# Patient Record
Sex: Female | Born: 1955 | Race: White | Hispanic: No | Smoking: Former smoker
Health system: Southern US, Community
[De-identification: ages and names within clinical notes are randomized; demographics above are authoritative.]

## PROBLEM LIST (undated history)

## (undated) DIAGNOSIS — Z923 Personal history of irradiation: Secondary | ICD-10-CM

## (undated) DIAGNOSIS — F411 Generalized anxiety disorder: Secondary | ICD-10-CM

## (undated) DIAGNOSIS — I10 Essential (primary) hypertension: Secondary | ICD-10-CM

## (undated) DIAGNOSIS — F17200 Nicotine dependence, unspecified, uncomplicated: Secondary | ICD-10-CM

## (undated) DIAGNOSIS — K5730 Diverticulosis of large intestine without perforation or abscess without bleeding: Secondary | ICD-10-CM

## (undated) DIAGNOSIS — G5600 Carpal tunnel syndrome, unspecified upper limb: Secondary | ICD-10-CM

## (undated) DIAGNOSIS — J45909 Unspecified asthma, uncomplicated: Secondary | ICD-10-CM

## (undated) DIAGNOSIS — F1190 Opioid use, unspecified, uncomplicated: Secondary | ICD-10-CM

## (undated) DIAGNOSIS — M5136 Other intervertebral disc degeneration, lumbar region: Secondary | ICD-10-CM

## (undated) DIAGNOSIS — Z8619 Personal history of other infectious and parasitic diseases: Secondary | ICD-10-CM

## (undated) DIAGNOSIS — R739 Hyperglycemia, unspecified: Secondary | ICD-10-CM

## (undated) DIAGNOSIS — R519 Headache, unspecified: Secondary | ICD-10-CM

## (undated) DIAGNOSIS — I2089 Other forms of angina pectoris: Secondary | ICD-10-CM

## (undated) DIAGNOSIS — E7800 Pure hypercholesterolemia, unspecified: Secondary | ICD-10-CM

## (undated) DIAGNOSIS — R7303 Prediabetes: Secondary | ICD-10-CM

## (undated) DIAGNOSIS — H269 Unspecified cataract: Secondary | ICD-10-CM

## (undated) DIAGNOSIS — J449 Chronic obstructive pulmonary disease, unspecified: Secondary | ICD-10-CM

## (undated) DIAGNOSIS — E785 Hyperlipidemia, unspecified: Secondary | ICD-10-CM

## (undated) DIAGNOSIS — R0602 Shortness of breath: Secondary | ICD-10-CM

## (undated) DIAGNOSIS — I951 Orthostatic hypotension: Secondary | ICD-10-CM

## (undated) DIAGNOSIS — R51 Headache: Secondary | ICD-10-CM

## (undated) DIAGNOSIS — K219 Gastro-esophageal reflux disease without esophagitis: Secondary | ICD-10-CM

## (undated) DIAGNOSIS — K635 Polyp of colon: Secondary | ICD-10-CM

## (undated) DIAGNOSIS — F909 Attention-deficit hyperactivity disorder, unspecified type: Secondary | ICD-10-CM

## (undated) DIAGNOSIS — M51369 Other intervertebral disc degeneration, lumbar region without mention of lumbar back pain or lower extremity pain: Secondary | ICD-10-CM

## (undated) HISTORY — PX: BREAST BIOPSY: SHX20

## (undated) HISTORY — DX: Headache: R51

## (undated) HISTORY — DX: Other intervertebral disc degeneration, lumbar region: M51.36

## (undated) HISTORY — PX: ABDOMINAL HYSTERECTOMY: SHX81

## (undated) HISTORY — DX: Personal history of other infectious and parasitic diseases: Z86.19

## (undated) HISTORY — DX: Polyp of colon: K63.5

## (undated) HISTORY — DX: Hyperglycemia, unspecified: R73.9

## (undated) HISTORY — PX: FINGER AMPUTATION: SHX636

## (undated) HISTORY — DX: Headache, unspecified: R51.9

## (undated) HISTORY — PX: COLONOSCOPY: SHX174

## (undated) HISTORY — DX: Other intervertebral disc degeneration, lumbar region without mention of lumbar back pain or lower extremity pain: M51.369

---
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
## 1983-01-02 HISTORY — PX: TOTAL ABDOMINAL HYSTERECTOMY W/ BILATERAL SALPINGOOPHORECTOMY: SHX83

## 2002-01-01 HISTORY — PX: NASAL SINUS SURGERY: SHX719

## 2004-01-02 HISTORY — PX: LASIK: SHX215

## 2004-01-04 ENCOUNTER — Emergency Department: Payer: Self-pay | Admitting: Emergency Medicine

## 2005-02-28 ENCOUNTER — Ambulatory Visit: Payer: Self-pay | Admitting: Pain Medicine

## 2005-06-09 ENCOUNTER — Emergency Department: Payer: Self-pay | Admitting: Unknown Physician Specialty

## 2005-06-10 ENCOUNTER — Emergency Department: Payer: Self-pay | Admitting: General Practice

## 2005-06-10 ENCOUNTER — Other Ambulatory Visit: Payer: Self-pay

## 2006-09-24 ENCOUNTER — Ambulatory Visit: Payer: Self-pay | Admitting: Internal Medicine

## 2007-01-30 ENCOUNTER — Ambulatory Visit: Payer: Self-pay | Admitting: Internal Medicine

## 2007-01-31 ENCOUNTER — Ambulatory Visit: Payer: Self-pay | Admitting: Family Medicine

## 2007-02-04 ENCOUNTER — Ambulatory Visit: Payer: Self-pay | Admitting: Internal Medicine

## 2008-01-02 DIAGNOSIS — K635 Polyp of colon: Secondary | ICD-10-CM

## 2008-01-02 DIAGNOSIS — D126 Benign neoplasm of colon, unspecified: Secondary | ICD-10-CM

## 2008-01-02 HISTORY — DX: Polyp of colon: K63.5

## 2008-01-02 HISTORY — DX: Benign neoplasm of colon, unspecified: D12.6

## 2009-10-05 ENCOUNTER — Emergency Department: Payer: Self-pay | Admitting: Internal Medicine

## 2011-01-02 DIAGNOSIS — Z8619 Personal history of other infectious and parasitic diseases: Secondary | ICD-10-CM

## 2011-01-02 HISTORY — DX: Personal history of other infectious and parasitic diseases: Z86.19

## 2012-01-02 HISTORY — PX: CARPAL TUNNEL RELEASE: SHX101

## 2012-05-08 ENCOUNTER — Ambulatory Visit: Payer: Self-pay | Admitting: Specialist

## 2012-05-15 ENCOUNTER — Ambulatory Visit: Payer: Self-pay | Admitting: Specialist

## 2012-12-01 ENCOUNTER — Telehealth: Payer: Self-pay | Admitting: Family Medicine

## 2012-12-01 NOTE — Telephone Encounter (Signed)
May place this Friday at 3 pm.

## 2012-12-01 NOTE — Telephone Encounter (Signed)
Pt is trying to est w/you as her PCP.  I have scheduled an appmt for 12/19/2012 for her, but she says her BG is running high on a daily basis and she feels she needs to est sooner.  Can you accommodate her an new pt apptmt prior to 12/19/2012?  Thank you.

## 2012-12-01 NOTE — Telephone Encounter (Signed)
Left mssg on pt's home voice mail to return call

## 2012-12-02 NOTE — Telephone Encounter (Signed)
R/s to 12/05/2012 @ 3:00 p.m.

## 2012-12-05 ENCOUNTER — Encounter: Payer: Self-pay | Admitting: Family Medicine

## 2012-12-05 ENCOUNTER — Ambulatory Visit (INDEPENDENT_AMBULATORY_CARE_PROVIDER_SITE_OTHER): Payer: BC Managed Care – PPO | Admitting: Family Medicine

## 2012-12-05 VITALS — BP 118/82 | HR 82 | Temp 97.6°F | Ht 63.0 in | Wt 116.8 lb

## 2012-12-05 DIAGNOSIS — F17200 Nicotine dependence, unspecified, uncomplicated: Secondary | ICD-10-CM

## 2012-12-05 DIAGNOSIS — R739 Hyperglycemia, unspecified: Secondary | ICD-10-CM

## 2012-12-05 DIAGNOSIS — F411 Generalized anxiety disorder: Secondary | ICD-10-CM

## 2012-12-05 DIAGNOSIS — R7309 Other abnormal glucose: Secondary | ICD-10-CM

## 2012-12-05 DIAGNOSIS — R519 Headache, unspecified: Secondary | ICD-10-CM | POA: Insufficient documentation

## 2012-12-05 DIAGNOSIS — Z23 Encounter for immunization: Secondary | ICD-10-CM

## 2012-12-05 DIAGNOSIS — R7303 Prediabetes: Secondary | ICD-10-CM

## 2012-12-05 DIAGNOSIS — R51 Headache: Secondary | ICD-10-CM | POA: Insufficient documentation

## 2012-12-05 HISTORY — DX: Prediabetes: R73.03

## 2012-12-05 HISTORY — DX: Generalized anxiety disorder: F41.1

## 2012-12-05 HISTORY — DX: Nicotine dependence, unspecified, uncomplicated: F17.200

## 2012-12-05 LAB — BASIC METABOLIC PANEL
CO2: 28 mEq/L (ref 19–32)
Chloride: 103 mEq/L (ref 96–112)
Creat: 0.66 mg/dL (ref 0.50–1.10)
Sodium: 139 mEq/L (ref 135–145)

## 2012-12-05 LAB — HEMOGLOBIN A1C
Hgb A1c MFr Bld: 5.9 % — ABNORMAL HIGH (ref <5.7)
Mean Plasma Glucose: 123 mg/dL — ABNORMAL HIGH (ref <117)

## 2012-12-05 MED ORDER — CYCLOBENZAPRINE HCL 10 MG PO TABS: 5.0000 mg | ORAL_TABLET | Freq: Two times a day (BID) | ORAL | Status: DC | PRN

## 2012-12-05 MED ORDER — METFORMIN HCL 500 MG PO TABS: 500.0000 mg | ORAL_TABLET | Freq: Every day | ORAL | Status: DC

## 2012-12-05 NOTE — Assessment & Plan Note (Signed)
Random sugar at home to 395 - concern for dx diabetes. Start metformin, check A1c today. Will reassess at f/u visit.

## 2012-12-05 NOTE — Assessment & Plan Note (Signed)
Discussed anxiety state - does not sound consistent with panic attacks, just increased anxiety at night time. Discussed relaxation techniques and handout provided.

## 2012-12-05 NOTE — Assessment & Plan Note (Signed)
Not consistent with migraines. ?MOH vs TTH. Treat with flexeril abortively, and re assess next visit. rec avoiding OTC analgesics.

## 2012-12-05 NOTE — Addendum Note (Signed)
Addended by: Josph Macho A on: 12/05/2012 04:15 PM   Modules accepted: Orders

## 2012-12-05 NOTE — Patient Instructions (Addendum)
Flu shot today. Blood work today - to check for diabetes. I'd like you to start metformin 500mg  once daily. Keep working on smoking cessation. Good to see you today, call us with questions. Return at your convenience fasting for blood work and afterwards for physical. For headaches - try flexeril muscle relaxant as needed instead of over the counter medicines.  Stress Stress-related medical problems are becoming increasingly common. The body has a built-in physical response to stressful situations. Faced with pressure, challenge or danger, we need to react quickly. Our bodies release hormones such as cortisol and adrenaline to help do this. These hormones are part of the "fight or flight" response and affect the metabolic rate, heart rate and blood pressure, resulting in a heightened, stressed state that prepares the body for optimum performance in dealing with a stressful situation. It is likely that early man required these mechanisms to stay alive, but usually modern stresses do not call for this, and the same hormones released in today's world can damage health and reduce coping ability. CAUSES  Pressure to perform at work, at school or in sports.  Threats of physical violence.  Money worries.  Arguments.  Family conflicts.  Divorce or separation from significant other.  Bereavement.  New job or unemployment.  Changes in location.  Alcohol or drug abuse. SOMETIMES, THERE IS NO PARTICULAR REASON FOR DEVELOPING STRESS. Almost all people are at risk of being stressed at some time in their lives. It is important to know that some stress is temporary and some is long term.  Temporary stress will go away when a situation is resolved. Most people can cope with short periods of stress, and it can often be relieved by relaxing, taking a walk, chatting through issues with friends, or having a good night's sleep.  Chronic (long-term, continuous) stress is much harder to deal with. It  can be psychologically and emotionally damaging. It can be harmful both for an individual and for friends and family. SYMPTOMS Everyone reacts to stress differently. There are some common effects that help [place] recognize it. In times of extreme stress, people may:  Shake uncontrollably.  Breathe faster and deeper than normal (hyperventilate).  Vomit.  For people with asthma, stress can trigger an attack.  For some people, stress may trigger migraine headaches, ulcers, and body pain. PHYSICAL EFFECTS OF STRESS MAY INCLUDE:  Loss of energy.  Skin problems.  Aches and pains resulting from tense muscles, including neck ache, backache and tension headaches.  Increased pain from arthritis and other conditions.  Irregular heart beat (palpitations).  Periods of irritability or anger.  Apathy or depression.  Anxiety (feeling uptight or worrying).  Unusual behavior.  Loss of appetite.  Comfort eating.  Lack of concentration.  Loss of, or decreased, sex-drive.  Increased smoking, drinking, or recreational drug use.  For women, missed periods.  Ulcers, joint pain, and muscle pain. Post-traumatic stress is the stress caused by any serious accident, strong emotional damage, or extremely difficult or violent experience such as rape or war. Post-traumatic stress victims can experience mixtures of emotions such as fear, shame, depression, guilt or anger. It may include recurrent memories or images that may be haunting. These feelings can last for weeks, months or even years after the traumatic event that triggered them. Specialized treatment, possibly with medicines and psychological therapies, is available. If stress is causing physical symptoms, severe distress or making it difficult for you to function as normal, it is worth seeing your caregiver. It is  important to remember that although stress is a usual part of life, extreme or prolonged stress can lead to other illnesses that will  need treatment. It is better to visit a doctor sooner rather than later. Stress has been linked to the development of high blood pressure and heart disease, as well as insomnia and depression. There is no diagnostic test for stress since everyone reacts to it differently. But a caregiver will be able to spot the physical symptoms, such as:  Headaches.  Shingles.  Ulcers. Emotional distress such as intense worry, low mood or irritability should be detected when the doctor asks pertinent questions to identify any underlying problems that might be the cause. In case there are physical reasons for the symptoms, the doctor may also want to do some tests to exclude certain conditions. If you feel that you are suffering from stress, try to identify the aspects of your life that are causing it. Sometimes you may not be able to change or avoid them, but even a small change can have a positive ripple effect. A simple lifestyle change can make all the difference. STRATEGIES THAT CAN HELP DEAL WITH STRESS:  Delegating or sharing responsibilities.  Avoiding confrontations.  Learning to be more assertive.  Regular exercise.  Avoid using alcohol or street drugs to cope.  Eating a healthy, balanced diet, rich in fruit and vegetables and proteins.  Finding humor or absurdity in stressful situations.  Never taking on more than you know you can handle comfortably.  Organizing your time better to get as much done as possible.  Talking to friends or family and sharing your thoughts and fears.  Listening to music or relaxation tapes.  Tensing and then relaxing your muscles, starting at the toes and working up to the head and neck. If you think that you would benefit from help, either in identifying the things that are causing your stress or in learning techniques to help you relax, see a caregiver who is capable of helping you with this. Rather than relying on medications, it is usually better to try  and identify the things in your life that are causing stress and try to deal with them. There are many techniques of managing stress including counseling, psychotherapy, aromatherapy, yoga, and exercise. Your caregiver can help you determine what is best for you. Document Released: 03/10/2002 Document Revised: 03/12/2011 Document Reviewed: 02/04/2007 [facility] Patient Information 2014 [place], [place].

## 2012-12-05 NOTE — Assessment & Plan Note (Signed)
Continue to encourage cessation Pt thinks daughter wil given her E cig for christmas.

## 2012-12-05 NOTE — Progress Notes (Signed)
Pre-visit discussion using our clinic review tool. No additional management support is needed unless otherwise documented below in the visit note.  

## 2012-12-05 NOTE — Progress Notes (Signed)
Subjective:    Patient ID: Ruth Morgan, female    DOB: [birth year], 57 y.o.   MRN: [id number]  HPI CC: new pt to establish  "Trouble with sugar" - states sugars have been running very high - 150-200.  2 wks ago checked sugar = 395, treated with increased water intake.  Does have fmhx diabetes.  + paresthesias of fingers.  Denies chest pain/tightness, dyspnea.  Last eye exam was 3+ yrs ago.  Panic attacks - gets nervous for no reason. Tends to happen at night when in bed. Denies stress.  Smoker - 1/2 - 1 ppd.  Has tried quitting smoking in the past.  Has tried chantix and wellbutrin.  Has not tried NRT or E cig.  Headaches started several weeks ago occipital headaches - associated with nausea.  Taking sinus medicine, bc powders.  Some neck pain as well.  Preventative: No recent CPE Mammogram ~2011 Flu shot today.  Medications and allergies reviewed and updated in chart.  Past histories reviewed and updated if relevant as below. There are no active problems to display for this patient.  Past Medical History  Diagnosis Date  . History of shingles 2013  . Headache   . Colon polyp 2010    at [facility]  . DDD (degenerative disc disease), lumbar     by MRI  . Hyperglycemia    Past Surgical History  Procedure Laterality Date  . Nasal sinus surgery  2004  . Carpal tunnel release Right 2014  . Lasik  2006  . Appendectomy  1972  . Total abdominal hysterectomy w/ bilateral salpingoophorectomy  1985    benign ovarian tumors   History  Substance Use Topics  . Smoking status: Current Every Day Smoker -- 1.00 packs/day    Types: Cigarettes  . Smokeless tobacco: Never Used  . Alcohol Use: No     Comment: rare   Family History  Problem Relation Age of Onset  . Diabetes Sister   . Diabetes Brother   . Cancer Father     stomach  . Cancer Paternal Grandfather     throat  . Cancer Paternal Grandmother     ovarian  . CAD Father 56    MI  . CAD Sister     MI  . CAD Brother       MI  . Kidney failure Father     ESRD on HD  . Hyperlipidemia Father   . Hypertension Mother    No Known Allergies No current outpatient prescriptions on file prior to visit.   No current facility-administered medications on file prior to visit.     Review of Systems Per HPI    Objective:   Physical Exam  Nursing note and vitals reviewed. Constitutional: She is oriented to person, place, and time. She appears well-developed and well-nourished. No distress.  thin  HENT:  Head: Normocephalic and atraumatic.  Right Ear: External ear normal.  Left Ear: External ear normal.  Nose: Nose normal.  Mouth/Throat: Oropharynx is clear and moist. No oropharyngeal exudate.  Eyes: Conjunctivae and EOM are normal. Pupils are equal, round, and reactive to light. No scleral icterus.  Neck: Normal range of motion. Neck supple.  Cardiovascular: Normal rate, regular rhythm, normal heart sounds and intact distal pulses.   No murmur heard. Pulses:      Radial pulses are 2+ on the right side, and 2+ on the left side.  Pulmonary/Chest: Effort normal and breath sounds normal. No respiratory distress. She has no  wheezes. She has no rales.  Musculoskeletal: Normal range of motion. She exhibits no edema.  Diabetic foot exam: Normal inspection No skin breakdown No calluses  Normal DP/PT pulses Normal sensation to light touch and monofilament Nails normal  Lymphadenopathy:    She has no cervical adenopathy.  Neurological: She is alert and oriented to person, place, and time.  CN grossly intact, station and gait intact  Skin: Skin is warm and dry. No rash noted.  Psychiatric: She has a normal mood and affect. Her behavior is normal. Judgment and thought content normal.       Assessment & Plan:

## 2012-12-07 ENCOUNTER — Other Ambulatory Visit: Payer: Self-pay | Admitting: Family Medicine

## 2012-12-08 ENCOUNTER — Encounter: Payer: Self-pay | Admitting: *Deleted

## 2012-12-19 ENCOUNTER — Ambulatory Visit: Payer: Self-pay | Admitting: Family Medicine

## 2013-01-02 ENCOUNTER — Other Ambulatory Visit: Payer: Self-pay | Admitting: Family Medicine

## 2013-01-02 DIAGNOSIS — R739 Hyperglycemia, unspecified: Secondary | ICD-10-CM

## 2013-01-02 DIAGNOSIS — Z0000 Encounter for general adult medical examination without abnormal findings: Secondary | ICD-10-CM

## 2013-01-06 ENCOUNTER — Other Ambulatory Visit: Payer: BC Managed Care – PPO

## 2013-01-09 ENCOUNTER — Other Ambulatory Visit: Payer: BC Managed Care – PPO

## 2013-01-13 ENCOUNTER — Encounter: Payer: BC Managed Care – PPO | Admitting: Family Medicine

## 2013-01-13 DIAGNOSIS — Z0289 Encounter for other administrative examinations: Secondary | ICD-10-CM

## 2013-01-28 ENCOUNTER — Ambulatory Visit: Payer: Self-pay | Admitting: Family Medicine

## 2013-03-30 ENCOUNTER — Ambulatory Visit: Payer: Self-pay | Admitting: Family Medicine

## 2013-03-30 DIAGNOSIS — Z0289 Encounter for other administrative examinations: Secondary | ICD-10-CM

## 2013-04-27 ENCOUNTER — Other Ambulatory Visit (INDEPENDENT_AMBULATORY_CARE_PROVIDER_SITE_OTHER): Payer: No Typology Code available for payment source

## 2013-04-27 DIAGNOSIS — Z0000 Encounter for general adult medical examination without abnormal findings: Secondary | ICD-10-CM

## 2013-04-27 DIAGNOSIS — R7309 Other abnormal glucose: Secondary | ICD-10-CM

## 2013-04-27 DIAGNOSIS — R739 Hyperglycemia, unspecified: Secondary | ICD-10-CM

## 2013-04-27 LAB — COMPREHENSIVE METABOLIC PANEL
ALT: 12 U/L (ref 0–35)
AST: 17 U/L (ref 0–37)
Albumin: 3.5 g/dL (ref 3.5–5.2)
Alkaline Phosphatase: 68 U/L (ref 39–117)
BILIRUBIN TOTAL: 0.2 mg/dL — AB (ref 0.3–1.2)
BUN: 19 mg/dL (ref 6–23)
CALCIUM: 8.7 mg/dL (ref 8.4–10.5)
CHLORIDE: 102 meq/L (ref 96–112)
CO2: 31 meq/L (ref 19–32)
CREATININE: 0.8 mg/dL (ref 0.4–1.2)
GFR: 83.77 mL/min (ref >60.00)
GLUCOSE: 111 mg/dL — AB (ref 70–99)
Potassium: 4.4 mEq/L (ref 3.5–5.1)
Sodium: 137 mEq/L (ref 135–145)
Total Protein: 6.5 g/dL (ref 6.0–8.3)

## 2013-04-27 LAB — LIPID PANEL
CHOL/HDL RATIO: 3
CHOLESTEROL: 174 mg/dL (ref 0–200)
HDL: 53.4 mg/dL (ref >39.00)
LDL Cholesterol: 98 mg/dL (ref 0–99)
Triglycerides: 115 mg/dL (ref 0.0–149.0)
VLDL: 23 mg/dL (ref 0.0–40.0)

## 2013-04-27 LAB — TSH: TSH: 2.04 u[IU]/mL (ref 0.35–5.50)

## 2013-05-04 ENCOUNTER — Encounter: Payer: Self-pay | Admitting: Family Medicine

## 2013-05-04 ENCOUNTER — Ambulatory Visit (INDEPENDENT_AMBULATORY_CARE_PROVIDER_SITE_OTHER): Payer: No Typology Code available for payment source | Admitting: Family Medicine

## 2013-05-04 VITALS — BP 122/84 | HR 92 | Temp 97.9°F | Ht 61.75 in | Wt 114.5 lb

## 2013-05-04 DIAGNOSIS — M25561 Pain in right knee: Secondary | ICD-10-CM

## 2013-05-04 DIAGNOSIS — Z23 Encounter for immunization: Secondary | ICD-10-CM

## 2013-05-04 DIAGNOSIS — K130 Diseases of lips: Secondary | ICD-10-CM

## 2013-05-04 DIAGNOSIS — F17200 Nicotine dependence, unspecified, uncomplicated: Secondary | ICD-10-CM

## 2013-05-04 DIAGNOSIS — Z0000 Encounter for general adult medical examination without abnormal findings: Secondary | ICD-10-CM

## 2013-05-04 DIAGNOSIS — Z1231 Encounter for screening mammogram for malignant neoplasm of breast: Secondary | ICD-10-CM

## 2013-05-04 DIAGNOSIS — Z86010 Personal history of colonic polyps: Secondary | ICD-10-CM

## 2013-05-04 DIAGNOSIS — M25569 Pain in unspecified knee: Secondary | ICD-10-CM

## 2013-05-04 DIAGNOSIS — R7303 Prediabetes: Secondary | ICD-10-CM

## 2013-05-04 DIAGNOSIS — R7309 Other abnormal glucose: Secondary | ICD-10-CM

## 2013-05-04 NOTE — Assessment & Plan Note (Signed)
Chronic, stable. Continue to monitor. Discussed avoiding added sugars and sweetened beverages.

## 2013-05-04 NOTE — Progress Notes (Signed)
Pre visit review using our clinic review tool, if applicable. No additional management support is needed unless otherwise documented below in the visit note. 

## 2013-05-04 NOTE — Patient Instructions (Addendum)
Pneumovax today (pneumonia shot). Pass by Marion's office for colonoscopy and mammogram referral.  If Rosaria Ferries not available, we will call you in next 2 weeks. I think you have patellofemoral pain after fall - treat with knee sleeve use, advil or aleve as needed, elevation of knee and ice to knee.  Do stretching exercises provided today.  If not better with this, return to see me. Good to see you today, call us with questions.

## 2013-05-04 NOTE — Progress Notes (Signed)
BP 122/84  Pulse 92  Temp(Src) 97.9 F (36.6 C) (Oral)  Ht 5' 1.75" (1.568 m)  Wt 114 lb 8 oz (51.937 kg)  BMI 21.12 kg/m2  SpO2 95%   CC: CPE  Subjective:    Patient ID: Ruth Morgan, female    DOB: 22-Jun-1955, 58 y.o.   MRN: [id number]  HPI: Ruth Morgan is a 58 y.o. female presenting on 05/04/2013 for Annual Exam   Recent diabetes scare - but A1c returned 5.9%.  Stopped metformin.  Leg pain for last 2 weeks.  Worse at night time.  Stretching improves legs.  Last week fell off sink - hurt R knee.  Now feeling better but still some pulling with walking up steps.  Smoker - 1 ppd, action phase.  Strong fmhx CAD - sees Dr. Nehemiah Massed Q6 months.  Preventative: No recent CPE  Colon cancer screening - discussed - would like colonsocopy.  H/o colon polyps, latest 2010 at [facility] - told needed Q3 years. Mammogram ~2011.  Would like rpt.  No recent breast exam - will do today Well woman - s/p total hysterectomy for benign ovarian tumor.  Doesn't need pap smear. Flu shot 2014 Pneumovax - interested - will do today. We are out of prevnar today. zostavax - not currently interested.  Has had shingles shot. Sunscreen use discussed.  Ex tanner. Seat belt use discussed. Eye exam 04/2013  Lives with ex husband Grown daughter Occupation: caregiver, cleans apartments Activity: stays active with work Diet: good water, fruits/vegetables daily  Relevant past medical, surgical, family and social history reviewed and updated as indicated.  Allergies and medications reviewed and updated. Current Outpatient Prescriptions on File Prior to Visit  Medication Sig  . cyclobenzaprine (FLEXERIL) 10 MG tablet Take 0.5-1 tablets (5-10 mg total) by mouth 2 (two) times daily as needed for muscle spasms (headache).   No current facility-administered medications on file prior to visit.    Review of Systems  Constitutional: Negative for fever, chills, activity change, appetite change, fatigue and  unexpected weight change.  HENT: Negative for hearing loss.   Eyes: Negative for visual disturbance.  Respiratory: Positive for cough (smoker's). Negative for chest tightness, shortness of breath and wheezing.   Cardiovascular: Negative for chest pain, palpitations and leg swelling.  Gastrointestinal: Positive for abdominal pain. Negative for nausea, vomiting, diarrhea, constipation, blood in stool and abdominal distention.  Genitourinary: Negative for hematuria and difficulty urinating.  Musculoskeletal: Negative for arthralgias, myalgias and neck pain.  Skin: Negative for rash.  Neurological: Positive for dizziness (occasional). Negative for seizures, syncope and headaches.  Hematological: Negative for adenopathy. Does not bruise/bleed easily.  Psychiatric/Behavioral: Negative for dysphoric mood. The patient is not nervous/anxious.    Per HPI unless specifically indicated above    Objective:    BP 122/84  Pulse 92  Temp(Src) 97.9 F (36.6 C) (Oral)  Ht 5' 1.75" (1.568 m)  Wt 114 lb 8 oz (51.937 kg)  BMI 21.12 kg/m2  SpO2 95%  Physical Exam  Nursing note and vitals reviewed. Constitutional: She is oriented to person, place, and time. She appears well-developed and well-nourished. No distress.  HENT:  Head: Normocephalic and atraumatic.  Right Ear: Hearing, tympanic membrane, external ear and ear canal normal.  Left Ear: Hearing, tympanic membrane, external ear and ear canal normal.  Nose: Nose normal.  Mouth/Throat: Uvula is midline, oropharynx is clear and moist and mucous membranes are normal. No oropharyngeal exudate, posterior oropharyngeal edema or posterior oropharyngeal erythema.  R lower lip with  72mm dark papule per patient unchanged, no ulceration, no pruritis or pain.  Eyes: Conjunctivae and EOM are normal. Pupils are equal, round, and reactive to light. No scleral icterus.  Neck: Normal range of motion. Neck supple. No thyromegaly present.  Cardiovascular: Normal  rate, regular rhythm, normal heart sounds and intact distal pulses.   No murmur heard. Pulses:      Radial pulses are 2+ on the right side, and 2+ on the left side.  Pulmonary/Chest: Effort normal and breath sounds normal. No respiratory distress. She has no wheezes. She has no rales. Right breast exhibits no inverted nipple, no mass, no nipple discharge, no skin change and no tenderness. Left breast exhibits no inverted nipple, no mass, no nipple discharge, no skin change and no tenderness.  Abdominal: Soft. Normal appearance and bowel sounds are normal. She exhibits no distension and no mass. There is no tenderness. There is no rebound and no guarding.  Musculoskeletal: Normal range of motion. She exhibits no edema.  L knee WNL R Knee exam: No deformity on inspection. Pain with palpation at right knee superior to medial patella No effusion/swelling noted. FROM in flex/extension without crepitus. No popliteal fullness. Neg drawer test. Neg mcmurray test. No pain with valgus/varus stress. + PFgrind. No abnormal patellar mobility.   Lymphadenopathy:       Head (right side): No submental, no submandibular, no tonsillar, no preauricular and no posterior auricular adenopathy present.       Head (left side): No submental, no submandibular, no tonsillar, no preauricular and no posterior auricular adenopathy present.    She has no cervical adenopathy.    She has no axillary adenopathy.       Right axillary: No lateral adenopathy present.       Left axillary: No lateral adenopathy present.      Right: No supraclavicular adenopathy present.       Left: No supraclavicular adenopathy present.  Neurological: She is alert and oriented to person, place, and time.  CN grossly intact, station and gait intact  Skin: Skin is warm and dry. No rash noted.  Psychiatric: She has a normal mood and affect. Her behavior is normal. Judgment and thought content normal.   Results for orders placed in visit on  04/27/13  LIPID PANEL      Result Value Ref Range   Cholesterol 174  0 - 200 mg/dL   Triglycerides 115.0  0.0 - 149.0 mg/dL   HDL 53.40  >39.00 mg/dL   VLDL 23.0  0.0 - 40.0 mg/dL   LDL Cholesterol 98  0 - 99 mg/dL   Total CHOL/HDL Ratio 3    COMPREHENSIVE METABOLIC PANEL      Result Value Ref Range   Sodium 137  135 - 145 mEq/L   Potassium 4.4  3.5 - 5.1 mEq/L   Chloride 102  96 - 112 mEq/L   CO2 31  19 - 32 mEq/L   Glucose, Bld 111 (*) 70 - 99 mg/dL   BUN 19  6 - 23 mg/dL   Creatinine, Ser 0.8  0.4 - 1.2 mg/dL   Total Bilirubin 0.2 (*) 0.3 - 1.2 mg/dL   Alkaline Phosphatase 68  39 - 117 U/L   AST 17  0 - 37 U/L   ALT 12  0 - 35 U/L   Total Protein 6.5  6.0 - 8.3 g/dL   Albumin 3.5  3.5 - 5.2 g/dL   Calcium 8.7  8.4 - 10.5 mg/dL   GFR  83.77  >60.00 mL/min  TSH      Result Value Ref Range   TSH 2.04  0.35 - 5.50 uIU/mL      Assessment & Plan:   Problem List Items Addressed This Visit   Prediabetes     Chronic, stable. Continue to monitor. Discussed avoiding added sugars and sweetened beverages.    Smoker     Continue to encourage cessation.  Contemplative -> action.  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    Healthcare maintenance - Primary     Preventative protocols reviewed and updated unless pt declined. Discussed healthy diet and lifestyle.    Lesion of lip     Will monitor for now - ?[place].    Right knee pain     Anticipate patellofemoral pain syndrome - see pt instructions for plan. Return for further eval if sxs persist or fail to improve. Pt agrees with plan.     Other Visit Diagnoses   Personal history of colonic polyps        Relevant Orders       Ambulatory referral to Gastroenterology    Other screening mammogram        Relevant Orders       MM DIGITAL SCREENING BILATERAL        Follow up plan: Return in about 6 months (around 11/04/2013), or as needed, for follow up.

## 2013-05-04 NOTE — Assessment & Plan Note (Signed)
Preventative protocols reviewed and updated unless pt declined. Discussed healthy diet and lifestyle.  

## 2013-05-04 NOTE — Assessment & Plan Note (Signed)
Continue to encourage cessation.  Contemplative -> action.  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

## 2013-05-04 NOTE — Assessment & Plan Note (Signed)
Anticipate patellofemoral pain syndrome - see pt instructions for plan. Return for further eval if sxs persist or fail to improve. Pt agrees with plan.

## 2013-05-04 NOTE — Addendum Note (Signed)
Addended by: Lurlean Nanny on: 05/04/2013 04:17 PM   Modules accepted: Orders

## 2013-05-04 NOTE — Assessment & Plan Note (Signed)
Will monitor for now - ?venous [place].

## 2013-05-05 ENCOUNTER — Telehealth: Payer: Self-pay | Admitting: Family Medicine

## 2013-05-05 NOTE — Telephone Encounter (Signed)
Relevant patient education assigned to patient using Emmi. ° °

## 2013-05-07 ENCOUNTER — Ambulatory Visit: Payer: No Typology Code available for payment source | Admitting: Family Medicine

## 2013-06-01 ENCOUNTER — Ambulatory Visit: Payer: No Typology Code available for payment source | Admitting: Adult Health

## 2013-06-04 ENCOUNTER — Telehealth: Payer: Self-pay | Admitting: Family Medicine

## 2013-06-04 NOTE — Telephone Encounter (Signed)
Have attempted 3 times to call the patient about scheduling her Colonoscopy appt. She has not returned my calls. Also see in the system that she has a New Pt appt scheduled with Raquel Rey next week on 06/09/13 at [facility]. Maybe they will get this scheduled for her at their office.

## 2013-06-04 NOTE — Telephone Encounter (Signed)
Noted. Will route to new PCP as [facility]

## 2013-06-09 ENCOUNTER — Ambulatory Visit: Payer: No Typology Code available for payment source | Admitting: Adult Health

## 2013-06-09 ENCOUNTER — Telehealth: Payer: Self-pay | Admitting: Adult Health

## 2013-06-09 NOTE — Telephone Encounter (Signed)
Pt left vm at 2:00 p.m. Wanting to rs new pt appt.  Returned pt call, unable to leave msg, no vm.

## 2013-06-09 NOTE — Telephone Encounter (Signed)
It is ok to reschedule the patient per Raquel

## 2014-01-01 DIAGNOSIS — Z9841 Cataract extraction status, right eye: Secondary | ICD-10-CM

## 2014-01-01 HISTORY — PX: BREAST BIOPSY: SHX20

## 2014-01-01 HISTORY — DX: Cataract extraction status, left eye: Z98.41

## 2014-04-23 NOTE — Op Note (Signed)
PATIENT NAME:  Ruth Morgan, Ruth Morgan MR#:  [id number] DATE OF BIRTH:  1955/06/10  DATE OF PROCEDURE:  05/15/2012  PREOPERATIVE DIAGNOSIS:  Right carpal tunnel syndrome.   POSTOPERATIVE DIAGNOSIS:  Right carpal tunnel syndrome.   PROCEDURE:  Right carpal tunnel release.   SURGEON:  Lucas Mallow, MD  ANESTHESIA:  General.   COMPLICATIONS:  None.   TOURNIQUET TIME:  Five minutes.   PROCEDURE:  After adequate induction of general anesthesia, the right upper extremity is thoroughly prepped with alcohol and ChloraPrep and draped in A standard sterile fashion. The extremity is wrapped out with the Esmarch bandage and pneumatic tourniquet elevated to 250 mmHg. Under loupe magnification, a standard volar incision is made then the dissection carried down to the transverse retinacular ligament. This is incised in the midportion with the knife. The distal release is performed with the small scissors. The proximal release is performed with the small scissors and the carpal tunnel scissors. There is seen to be moderate compression of the median nerve directly beneath the transverse retinacular ligament. There is mild synovitis present. There is no mass lesion present. The wound is thoroughly irrigated multiple times. The skin edges are infiltrated with 0.5% plain Marcaine. The skin is closed with 4-0 nylon. A soft bulky dressing is applied. The tourniquet is released. The patient is returned to the Recovery Room in satisfactory condition having tolerated the procedure quite well.  ____________________________ Lucas Mallow, MD ces:jm D: 05/15/2012 09:47:38 ET T: 05/15/2012 10:20:25 ET JOB#: [id number]  cc: Lucas Mallow, MD, <Dictator> Lucas Mallow MD ELECTRONICALLY SIGNED 05/19/2012 8:18

## 2014-05-04 HISTORY — PX: CATARACT EXTRACTION W/ INTRAOCULAR LENS IMPLANT: SHX1309

## 2014-06-09 HISTORY — PX: CATARACT EXTRACTION W/ INTRAOCULAR LENS IMPLANT: SHX1309

## 2016-01-03 ENCOUNTER — Ambulatory Visit: Payer: Self-pay | Admitting: Podiatry

## 2016-01-17 ENCOUNTER — Ambulatory Visit (INDEPENDENT_AMBULATORY_CARE_PROVIDER_SITE_OTHER): Payer: BLUE CROSS/BLUE SHIELD

## 2016-01-17 ENCOUNTER — Ambulatory Visit (INDEPENDENT_AMBULATORY_CARE_PROVIDER_SITE_OTHER): Payer: BLUE CROSS/BLUE SHIELD | Admitting: Podiatry

## 2016-01-17 DIAGNOSIS — S92002A Unspecified fracture of left calcaneus, initial encounter for closed fracture: Secondary | ICD-10-CM

## 2016-01-17 DIAGNOSIS — M7989 Other specified soft tissue disorders: Secondary | ICD-10-CM | POA: Diagnosis not present

## 2016-01-17 DIAGNOSIS — R52 Pain, unspecified: Secondary | ICD-10-CM

## 2016-01-20 ENCOUNTER — Telehealth: Payer: Self-pay | Admitting: *Deleted

## 2016-01-20 DIAGNOSIS — S92002A Unspecified fracture of left calcaneus, initial encounter for closed fracture: Secondary | ICD-10-CM

## 2016-01-20 NOTE — Telephone Encounter (Addendum)
-----   Message from Edrick Kins, DPM sent at 01/17/2016  3:51 PM EST ----- Regarding: MRI left foot Please order MRI left foot.  Dx : suspect calcaneal fracture left. Fall from height. Soft tissue swelling left.   Thanks, Dr. Amalia Hailey. 01/20/2016-MRI orders faxed to [facility]. 01/25/2016-Unable to leave a message on [phone] mail box is full. Work phone is busy. Left message for pt to call central scheduling for [facility] to schedule MRI. 02/15/2016-Orders for knee scooter emailed to [place].

## 2016-01-22 NOTE — Progress Notes (Signed)
   Subjective:  Patient presents today as a new patient for evaluation of bilateral foot pain. Patient states that in September 2017 fell from approximately 10 feet high and landed directly on his feet. Patient states that he has had pain and tenderness ever since with the left foot more symptomatic than the right. Patient also started states that he notices intermittent swelling and tenderness to the left heel. Patient states that when the injury occurred he noticed significant swelling and ecchymosis to the plantar aspect of the foot as well as the rear foot.    Objective/Physical Exam General: The patient is alert and oriented x3 in no acute distress.  Dermatology: Skin is warm, dry and supple bilateral lower extremities. Negative for open lesions or macerations.  Vascular: Palpable pedal pulses bilaterally. No edema or erythema noted. Capillary refill within normal limits.  Neurological: Epicritic and protective threshold grossly intact bilaterally.   Musculoskeletal Exam: Pain on palpation noted to the plantar aspect of the left heel as well as with compression of the calcaneus of the left heel. Range of motion within normal limits to all pedal and ankle joints bilateral. Muscle strength 5/5 in all groups bilateral.   Radiographic Exam:  Normal osseous mineralization. Joint spaces preserved. No fracture/dislocation/boney destruction noted.  Assessment: #1 suspect calcaneal fracture left #2 edema left rear foot #3 pain in left foot   Plan of Care:  #1 Patient was evaluated. #2 today we are going to order an MRI of the left foot due to suspicious intermittent swelling and high suspect for calcaneal fracture left foot #3 return to clinic in 4 weeks to review MRI results   Edrick Kins, DPM [facility]  Dr. Edrick Kins, [address]                                        [place], [place] [address]                Office [phone]  Fax [phone]

## 2016-01-25 ENCOUNTER — Telehealth: Payer: Self-pay | Admitting: *Deleted

## 2016-01-25 NOTE — Telephone Encounter (Signed)
"  I'm calling about this patient, Ruth Morgan.  Her MRI requires authorization from [place].  If you have any questions give me a call."

## 2016-01-26 NOTE — Telephone Encounter (Signed)
"  We need her pre-cert today by 2 pm or we will have to reschedule her appointment that is scheduled for tomorrow.  Thank you, have a good day."  I am calling to let you know Ruth Morgan's surgery has been authorized from 01/26/2016 to 02/24/2016.  The ID number is [id number].  "Okay, I'll mark it off my list."

## 2016-01-27 ENCOUNTER — Ambulatory Visit
Admission: RE | Admit: 2016-01-27 | Discharge: 2016-01-27 | Disposition: A | Discharge status: Home / Self Care | Payer: BLUE CROSS/BLUE SHIELD | Source: Ambulatory Visit | Attending: Podiatry | Admitting: Podiatry

## 2016-01-27 DIAGNOSIS — M6688 Spontaneous rupture of other tendons, other: Secondary | ICD-10-CM | POA: Diagnosis not present

## 2016-01-27 DIAGNOSIS — S86312A Strain of muscle(s) and tendon(s) of peroneal muscle group at lower leg level, left leg, initial encounter: Secondary | ICD-10-CM | POA: Insufficient documentation

## 2016-01-27 DIAGNOSIS — M85872 Other specified disorders of bone density and structure, left ankle and foot: Secondary | ICD-10-CM | POA: Diagnosis not present

## 2016-01-27 DIAGNOSIS — S92002A Unspecified fracture of left calcaneus, initial encounter for closed fracture: Secondary | ICD-10-CM | POA: Diagnosis present

## 2016-01-27 DIAGNOSIS — X58XXXA Exposure to other specified factors, initial encounter: Secondary | ICD-10-CM | POA: Diagnosis not present

## 2016-02-14 ENCOUNTER — Ambulatory Visit (INDEPENDENT_AMBULATORY_CARE_PROVIDER_SITE_OTHER): Payer: BLUE CROSS/BLUE SHIELD | Admitting: Podiatry

## 2016-02-14 DIAGNOSIS — R600 Localized edema: Secondary | ICD-10-CM

## 2016-02-14 DIAGNOSIS — S92055G Nondisplaced other extraarticular fracture of left calcaneus, subsequent encounter for fracture with delayed healing: Secondary | ICD-10-CM

## 2016-02-14 DIAGNOSIS — M79672 Pain in left foot: Secondary | ICD-10-CM | POA: Diagnosis not present

## 2016-02-14 NOTE — Progress Notes (Signed)
   Subjective:  Patient presents today for follow-up evaluation of left heel pain. Patient states that in September 2017 fell from approximately 10 feet high and landed directly on his feet. Patient states that he has had pain and tenderness ever since with the left foot more symptomatic than the right. Patient also started states that he notices intermittent swelling and tenderness to the left heel. Patient states that when the injury occurred he noticed significant swelling and ecchymosis to the plantar aspect of the foot as well as the rear foot. On last visit MRI was ordered. Patient presents today to review MRI results and for further treatment options.    Objective/Physical Exam General: The patient is alert and oriented x3 in no acute distress.  Dermatology: Skin is warm, dry and supple bilateral lower extremities. Negative for open lesions or macerations.  Vascular: Palpable pedal pulses bilaterally. No edema or erythema noted. Capillary refill within normal limits.  Neurological: Epicritic and protective threshold grossly intact bilaterally.   Musculoskeletal Exam: Pain on palpation noted to the plantar aspect of the left heel as well as with compression of the calcaneus of the left heel. Range of motion within normal limits to all pedal and ankle joints bilateral. Muscle strength 5/5 in all groups bilateral.   Radiographic Exam:  Normal osseous mineralization. Joint spaces preserved. No fracture/dislocation/boney destruction noted.  MRI Impression:  Late subacute appearing fracture of the heel as described above is minimally displaced. There is some bridging bone about the fracture. The fracture does not involve an articular surface of the calcaneus.  Assessment: #1 minimally displaced calcaneal fracture left #2 edema left rear foot #3 pain in left foot   Plan of Care:  #1 Patient was evaluated. MRI results were reviewed today #2 today were going to recommend conservative  treatment. Strict nonweightbearing and immobilization for 4-6 weeks. #3 cam boot dispensed today #4 compression anklet dispensed today #5 orders for knee scooter were placed  #6 patient has a walker at home that she is going to use for nonweightbearing  #7 return to clinic in 4 weeks   Edrick Kins, DPM [facility]  Dr. Edrick Kins, [address]                                        [place], [place] [address]                Office [phone]  Fax [phone]

## 2016-02-15 NOTE — Telephone Encounter (Signed)
-----   Message from Edrick Kins, DPM sent at 02/14/2016  4:08 PM EST ----- Regarding: Knee scooter Please see if we can approve the patient for a knee scooter.  Dx : minimally displaced calcaneal fracture left foot.   Strict NWB left lower extremity x 6-8 weeks.   Thanks, Dr. Amalia Hailey

## 2016-03-13 ENCOUNTER — Ambulatory Visit: Payer: BLUE CROSS/BLUE SHIELD | Admitting: Podiatry

## 2016-05-11 ENCOUNTER — Encounter: Payer: BLUE CROSS/BLUE SHIELD | Admitting: Podiatry

## 2016-05-11 ENCOUNTER — Ambulatory Visit: Payer: BLUE CROSS/BLUE SHIELD

## 2016-05-20 NOTE — Progress Notes (Signed)
This encounter was created in error - please disregard.

## 2016-05-22 ENCOUNTER — Ambulatory Visit: Payer: BLUE CROSS/BLUE SHIELD | Admitting: Podiatry

## 2017-04-10 ENCOUNTER — Encounter: Payer: Self-pay | Admitting: *Deleted

## 2017-04-10 ENCOUNTER — Other Ambulatory Visit: Payer: Self-pay

## 2017-04-10 NOTE — Discharge Instructions (Signed)
INSTRUCTIONS FOLLOWING OCULOPLASTIC SURGERY AMY Dennie Maizes, MD  AFTER YOUR EYE SURGERY, THER ARE MANY THINGS [place] YOU, THE PATIENT, CAN DO TO ASSURE THE BEST POSSIBLE RESULT FROM YOUR OPERATION.  THIS SHEET SHOULD BE REFERRED TO WHENEVER QUESTIONS ARISE.  IF THERE ARE ANY QUESTIONS NOT ANSWERED HERE, DO NOT HESITATE TO CALL OUR OFFICE AT [phone] OR [phone].  THERE IS ALWAYS [facility] AVAILABLE TO CALL IF QUESTIONS OR PROBLEMS ARISE.  VISION: Your vision may be blurred and out of focus after surgery until you are able to stop using your ointment, swelling resolves and your eye(s) heal. This may take 1 to 2 weeks at the least.  If your vision becomes gradually more dim or dark, this is not normal and you need to call our office immediately.  EYE CARE: For the first 48 hours after surgery, use ice packs frequently - 20 minutes on, 20 minutes off - to help reduce swelling and bruising.  Small bags of frozen peas or corn make good ice packs along with cloths soaked in ice water.  If you are wearing a patch or other type of dressing following surgery, keep this on for the amount of time specified by your doctor.  For the first week following surgery, you will need to treat your stitches with great care.  If is OK to shower, but take care to not allow soapy water to run into your eye(s) to help reduce changes of infection.  You may gently clean the eyelashes and around the eye(s) with cotton balls and sterile water, BUT DO NOT RUB THE STITCHES VIGOROUSLY.  Keeping your stitches moist with ointment will help promote healing with minimal scar formation.  ACTIVITY: When you leave the surgery center, you should go home, rest and be inactive.  The eye(s) may feel scratchy and keeping the eyes closed will allow for faster healing.  The first week following surgery, avoid straining (anything making the face turn red) or lifting over 20 pounds.  Additionally, avoid bending which causes your head to go below  your waist.  Using your eyes will NOT harm them, so feel free to read, watch television, use the computer, etc as desired.  Driving depends on each individual, so check with your doctor if you have questions about driving.  MEDICATIONS:  You will be given a prescription for an ointment to use 4 times a day on your stitches.  You can use the ointment in your eyes if they feel scratchy or irritated.  If you eyelid(s) dont close completely when you sleep, put some ointment in your eyes before bedtime.  EMERGENCY: If you experience SEVERE EYE PAIN OR HEADACHE UNRELIEVED BY TYLENOL OR PERCOCET, NAUSEA OR VOMITING, WORSENING REDNESS, OR WORSENING VISION (ESPECIALLY VISION THAT WA INITIALLY BETTER) CALL [phone] OR [phone] DURING BUSINESS HOURS OR AFTER HOURS.INSTRUCTIONS FOLLOWING OCULOPLASTIC SURGERY AMY Dennie Maizes, MD  AFTER YOUR EYE SURGERY, THER ARE MANY THINGS [place] YOU, THE PATIENT, CAN DO TO ASSURE THE BEST POSSIBLE RESULT FROM YOUR OPERATION.  THIS SHEET SHOULD BE REFERRED TO WHENEVER QUESTIONS ARISE.  IF THERE ARE ANY QUESTIONS NOT ANSWERED HERE, DO NOT HESITATE TO CALL OUR OFFICE AT [phone] OR [phone].  THERE IS ALWAYS [facility] AVAILABLE TO CALL IF QUESTIONS OR PROBLEMS ARISE.  VISION: Your vision may be blurred and out of focus after surgery until you are able to stop using your ointment, swelling resolves and your eye(s) heal. This may take 1 to 2 weeks at the least.  If your  vision becomes gradually more dim or dark, this is not normal and you need to call our office immediately.  EYE CARE: For the first 48 hours after surgery, use ice packs frequently - 20 minutes on, 20 minutes off - to help reduce swelling and bruising.  Small bags of frozen peas or corn make good ice packs along with cloths soaked in ice water.  If you are wearing a patch or other type of dressing following surgery, keep this on for the amount of time specified by your doctor.  For the first week  following surgery, you will need to treat your stitches with great care.  If is OK to shower, but take care to not allow soapy water to run into your eye(s) to help reduce changes of infection.  You may gently clean the eyelashes and around the eye(s) with cotton balls and sterile water, BUT DO NOT RUB THE STITCHES VIGOROUSLY.  Keeping your stitches moist with ointment will help promote healing with minimal scar formation.  ACTIVITY: When you leave the surgery center, you should go home, rest and be inactive.  The eye(s) may feel scratchy and keeping the eyes closed will allow for faster healing.  The first week following surgery, avoid straining (anything making the face turn red) or lifting over 20 pounds.  Additionally, avoid bending which causes your head to go below your waist.  Using your eyes will NOT harm them, so feel free to read, watch television, use the computer, etc as desired.  Driving depends on each individual, so check with your doctor if you have questions about driving.  MEDICATIONS:  You will be given a prescription for an ointment to use 4 times a day on your stitches.  You can use the ointment in your eyes if they feel scratchy or irritated.  If you eyelid(s) dont close completely when you sleep, put some ointment in your eyes before bedtime.  EMERGENCY: If you experience SEVERE EYE PAIN OR HEADACHE UNRELIEVED BY TYLENOL OR PERCOCET, NAUSEA OR VOMITING, WORSENING REDNESS, OR WORSENING VISION (ESPECIALLY VISION THAT WA INITIALLY BETTER) CALL [phone] OR [phone] DURING BUSINESS HOURS OR AFTER HOURS. General Anesthesia, Adult, Care After These instructions provide you with information about caring for yourself after your procedure. Your health care provider may also give you more specific instructions. Your treatment has been planned according to current medical practices, but problems sometimes occur. Call your health care provider if you have any problems or questions after  your procedure. What can I expect after the procedure? After the procedure, it is common to have:  Vomiting.  A sore throat.  Mental slowness.  It is common to feel:  Nauseous.  Cold or shivery.  Sleepy.  Tired.  Sore or achy, even in parts of your body where you did not have surgery.  Follow these instructions at home: For at least 24 hours after the procedure:  Do not: ? Participate in activities where you could fall or become injured. ? Drive. ? Use heavy machinery. ? Drink alcohol. ? Take sleeping pills or medicines that cause drowsiness. ? Make important decisions or sign legal documents. ? Take care of children on your own.  Rest. Eating and drinking  If you vomit, drink water, juice, or soup when you can drink without vomiting.  Drink enough fluid to keep your urine clear or pale yellow.  Make sure you have little or no nausea before eating solid foods.  Follow the diet recommended by your health care  provider. General instructions  Have a responsible adult stay with you until you are awake and alert.  Return to your normal activities as told by your health care provider. Ask your health care provider what activities are safe for you.  Take over-the-counter and prescription medicines only as told by your health care provider.  If you smoke, do not smoke without supervision.  Keep all follow-up visits as told by your health care provider. This is important. Contact a health care provider if:  You continue to have nausea or vomiting at home, and medicines are not helpful.  You cannot drink fluids or start eating again.  You cannot urinate after 8-12 hours.  You develop a skin rash.  You have fever.  You have increasing redness at the site of your procedure. Get help right away if:  You have difficulty breathing.  You have chest pain.  You have unexpected bleeding.  You feel that you are having a life-threatening or urgent problem. This  information is not intended to replace advice given to you by your health care provider. Make sure you discuss any questions you have with your health care provider. Document Released: 03/26/2000 Document Revised: 05/23/2015 Document Reviewed: 12/02/2014 Elsevier Interactive Patient Education  [employer].

## 2017-04-16 ENCOUNTER — Encounter: Payer: Self-pay | Admitting: Anesthesiology

## 2017-04-16 ENCOUNTER — Encounter
Admission: RE | Disposition: A | Discharge status: Home / Self Care | Payer: Self-pay | Source: Ambulatory Visit | Attending: Ophthalmology

## 2017-04-16 ENCOUNTER — Ambulatory Visit: Payer: BLUE CROSS/BLUE SHIELD | Admitting: Anesthesiology

## 2017-04-16 ENCOUNTER — Ambulatory Visit
Admission: RE | Admit: 2017-04-16 | Discharge: 2017-04-16 | Disposition: A | Discharge status: Home / Self Care | Payer: BLUE CROSS/BLUE SHIELD | Source: Ambulatory Visit | Attending: Ophthalmology | Admitting: Ophthalmology

## 2017-04-16 DIAGNOSIS — Z79899 Other long term (current) drug therapy: Secondary | ICD-10-CM | POA: Insufficient documentation

## 2017-04-16 DIAGNOSIS — H02831 Dermatochalasis of right upper eyelid: Secondary | ICD-10-CM | POA: Diagnosis not present

## 2017-04-16 DIAGNOSIS — F17200 Nicotine dependence, unspecified, uncomplicated: Secondary | ICD-10-CM | POA: Diagnosis not present

## 2017-04-16 DIAGNOSIS — F17219 Nicotine dependence, cigarettes, with unspecified nicotine-induced disorders: Secondary | ICD-10-CM | POA: Diagnosis not present

## 2017-04-16 DIAGNOSIS — F419 Anxiety disorder, unspecified: Secondary | ICD-10-CM | POA: Insufficient documentation

## 2017-04-16 DIAGNOSIS — Z90710 Acquired absence of both cervix and uterus: Secondary | ICD-10-CM | POA: Diagnosis not present

## 2017-04-16 DIAGNOSIS — M1990 Unspecified osteoarthritis, unspecified site: Secondary | ICD-10-CM | POA: Insufficient documentation

## 2017-04-16 DIAGNOSIS — F1120 Opioid dependence, uncomplicated: Secondary | ICD-10-CM | POA: Diagnosis not present

## 2017-04-16 DIAGNOSIS — H02834 Dermatochalasis of left upper eyelid: Secondary | ICD-10-CM | POA: Insufficient documentation

## 2017-04-16 DIAGNOSIS — H02833 Dermatochalasis of right eye, unspecified eyelid: Secondary | ICD-10-CM | POA: Diagnosis present

## 2017-04-16 DIAGNOSIS — R51 Headache: Secondary | ICD-10-CM | POA: Diagnosis not present

## 2017-04-16 DIAGNOSIS — G5602 Carpal tunnel syndrome, left upper limb: Secondary | ICD-10-CM | POA: Diagnosis not present

## 2017-04-16 HISTORY — DX: Generalized anxiety disorder: F41.1

## 2017-04-16 HISTORY — DX: Carpal tunnel syndrome, unspecified upper limb: G56.00

## 2017-04-16 HISTORY — DX: Nicotine dependence, unspecified, uncomplicated: F17.200

## 2017-04-16 HISTORY — DX: Prediabetes: R73.03

## 2017-04-16 HISTORY — PX: BROW LIFT: SHX178

## 2017-04-16 SURGERY — BLEPHAROPLASTY
Anesthesia: Monitor Anesthesia Care | Site: Eye | Laterality: Bilateral | Wound class: Clean

## 2017-04-16 MED ORDER — ACETAMINOPHEN 160 MG/5ML PO SOLN: 325.0000 mg | ORAL | Status: DC | PRN

## 2017-04-16 MED ORDER — BSS IO SOLN
INTRAOCULAR | Status: DC | PRN
  Administered 2017-04-16: 15 mL

## 2017-04-16 MED ORDER — TETRACAINE HCL 0.5 % OP SOLN
OPHTHALMIC | Status: DC | PRN
  Administered 2017-04-16: 2 [drp] via OPHTHALMIC

## 2017-04-16 MED ORDER — PROMETHAZINE HCL 25 MG/ML IJ SOLN
6.2500 mg | INTRAMUSCULAR | Status: DC | PRN
Start: 2017-04-16 — End: 2017-04-16

## 2017-04-16 MED ORDER — FENTANYL CITRATE (PF) 100 MCG/2ML IJ SOLN: 25.0000 ug | INTRAMUSCULAR | Status: DC | PRN

## 2017-04-16 MED ORDER — LACTATED RINGERS IV SOLN
10.0000 mL/h | INTRAVENOUS | Status: DC
  Administered 2017-04-16: 10 mL/h via INTRAVENOUS

## 2017-04-16 MED ORDER — PROPOFOL 10 MG/ML IV BOLUS
INTRAVENOUS | Status: DC | PRN
  Administered 2017-04-16: 100 mg via INTRAVENOUS

## 2017-04-16 MED ORDER — LIDOCAINE-EPINEPHRINE 2 %-1:100000 IJ SOLN
INTRAMUSCULAR | Status: DC | PRN
  Administered 2017-04-16: 1.5 mL via OPHTHALMIC
  Administered 2017-04-16: 2 mL via OPHTHALMIC

## 2017-04-16 MED ORDER — LIDOCAINE HCL (CARDIAC) 20 MG/ML IV SOLN
INTRAVENOUS | Status: DC | PRN
  Administered 2017-04-16: 40 mg via INTRAVENOUS

## 2017-04-16 MED ORDER — DEXAMETHASONE SODIUM PHOSPHATE 10 MG/ML IJ SOLN
INTRAMUSCULAR | Status: DC | PRN
  Administered 2017-04-16: 4 mg via INTRAVENOUS

## 2017-04-16 MED ORDER — OXYCODONE HCL 5 MG PO TABS: 5.0000 mg | ORAL_TABLET | Freq: Once | ORAL | Status: DC | PRN

## 2017-04-16 MED ORDER — PROPOFOL 500 MG/50ML IV EMUL
INTRAVENOUS | Status: DC | PRN
  Administered 2017-04-16: 50 ug/kg/min via INTRAVENOUS

## 2017-04-16 MED ORDER — ERYTHROMYCIN 5 MG/GM OP OINT: TOPICAL_OINTMENT | OPHTHALMIC | 3 refills | Status: DC

## 2017-04-16 MED ORDER — OXYCODONE HCL 5 MG/5ML PO SOLN: 5.0000 mg | Freq: Once | ORAL | Status: DC | PRN

## 2017-04-16 MED ORDER — ACETAMINOPHEN 325 MG PO TABS: 325.0000 mg | ORAL_TABLET | ORAL | Status: DC | PRN

## 2017-04-16 MED ORDER — MIDAZOLAM HCL 2 MG/2ML IJ SOLN
INTRAMUSCULAR | Status: DC | PRN
  Administered 2017-04-16: 2 mg via INTRAVENOUS

## 2017-04-16 MED ORDER — ERYTHROMYCIN 5 MG/GM OP OINT
TOPICAL_OINTMENT | OPHTHALMIC | Status: DC | PRN
  Administered 2017-04-16: 1 via OPHTHALMIC

## 2017-04-16 MED ORDER — ALFENTANIL 500 MCG/ML IJ INJ
INJECTION | INTRAVENOUS | Status: DC | PRN
  Administered 2017-04-16: 100 ug via INTRAVENOUS
  Administered 2017-04-16: 500 ug via INTRAVENOUS
  Administered 2017-04-16: 200 ug via INTRAVENOUS

## 2017-04-16 MED ORDER — TRAMADOL HCL 50 MG PO TABS: ORAL_TABLET | ORAL | 0 refills | Status: DC

## 2017-04-16 SURGICAL SUPPLY — 35 items
APPLICATOR COTTON TIP WD 3 STR (MISCELLANEOUS) ×6 IMPLANT
BLADE SURG 15 STRL LF DISP TIS (BLADE) ×1 IMPLANT
BLADE SURG 15 STRL SS (BLADE) ×2
CORD BIP STRL DISP 12FT (MISCELLANEOUS) ×3 IMPLANT
DRAPE HEAD BAR (DRAPES) ×3 IMPLANT
GAUZE SPONGE 4X4 12PLY STRL (GAUZE/BANDAGES/DRESSINGS) ×3 IMPLANT
GAUZE SPONGE NON-WVN 2X2 STRL (MISCELLANEOUS) ×10 IMPLANT
GLOVE SURG LX 7.0 MICRO (GLOVE) ×4
GLOVE SURG LX STRL 7.0 MICRO (GLOVE) ×2 IMPLANT
MARKER SKIN XFINE TIP W/RULER (MISCELLANEOUS) ×3 IMPLANT
NEEDLE FILTER BLUNT 18X 1/2SAF (NEEDLE) ×2
NEEDLE FILTER BLUNT 18X1 1/2 (NEEDLE) ×1 IMPLANT
NEEDLE HYPO 30X.5 LL (NEEDLE) ×6 IMPLANT
PACK DRAPE NASAL/ENT (PACKS) ×3 IMPLANT
SOL PREP PVP 2OZ (MISCELLANEOUS) ×3
SOLUTION PREP PVP 2OZ (MISCELLANEOUS) ×1 IMPLANT
SPONGE VERSALON 2X2 STRL (MISCELLANEOUS) ×20
SUT CHROMIC 4-0 (SUTURE)
SUT CHROMIC 4-0 M2 12X2 ARM (SUTURE)
SUT CHROMIC 5 0 P 3 (SUTURE) IMPLANT
SUT ETHILON 4 0 CL P 3 (SUTURE) IMPLANT
SUT MERSILENE 4-0 S-2 (SUTURE) IMPLANT
SUT PLAIN GUT (SUTURE) ×3 IMPLANT
SUT PROLENE 5 0 P 3 (SUTURE) IMPLANT
SUT PROLENE 6 0 P 1 18 (SUTURE) IMPLANT
SUT SILK 4 0 G 3 (SUTURE) IMPLANT
SUT VIC AB 5-0 P-3 18X BRD (SUTURE) IMPLANT
SUT VIC AB 5-0 P3 18 (SUTURE)
SUT VICRYL 6-0  S14 CTD (SUTURE)
SUT VICRYL 6-0 S14 CTD (SUTURE) IMPLANT
SUT VICRYL 7 0 TG140 8 (SUTURE) IMPLANT
SUTURE CHRMC 4-0 M2 12X2 ARM (SUTURE) IMPLANT
SYR 3ML LL SCALE MARK (SYRINGE) ×3 IMPLANT
SYRINGE 10CC LL (SYRINGE) ×3 IMPLANT
WATER STERILE IRR 250ML POUR (IV SOLUTION) ×3 IMPLANT

## 2017-04-16 NOTE — Interval H&P Note (Signed)
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 After consideration of risks, benefits and other options for treatment, the patient has consented to  Procedure(s): BLEPHAROPLASTY UPPER EYELID: W/EXCESS SKIN (Bilateral) as a surgical intervention .  The patient's history has been reviewed, patient examined, no change in status, stable for surgery.  I have reviewed the patient's chart and labs.  Questions were answered to the patient's satisfaction.     Vickki Muff, Keiji Melland M

## 2017-04-16 NOTE — Anesthesia Procedure Notes (Signed)
Procedure Name: LMA Insertion Date/Time: 04/16/2017 11:01 AM Performed by: Mayme Genta, CRNA Pre-anesthesia Checklist: Patient identified, Emergency Drugs available, Suction available, Timeout performed and Patient being monitored Patient Re-evaluated:Patient Re-evaluated prior to induction Oxygen Delivery Method: Circle system utilized Preoxygenation: Pre-oxygenation with 100% oxygen Induction Type: IV induction LMA: LMA inserted LMA Size: 3.0 Number of attempts: 1 Placement Confirmation: positive ETCO2 and breath sounds checked- equal and bilateral Tube secured with: Tape

## 2017-04-16 NOTE — Op Note (Signed)
Preoperative Diagnosis:  Visually significant dermatochalasis bilateral upper Eyelid(s)  Postoperative Diagnosis:  Same.  Procedure(s) Performed:   Upper eyelid blepharoplasty with excess skin excision bilateral upper Eyelid(s)  Teaching Surgeon: Philis Pique. Vickki Muff, M.D.  Assistants: none  Anesthesia: MAC - converted to general with LMA  Specimens: None.  Estimated Blood Loss: Minimal.  Complications: None.  Operative Findings: None Dictated  Procedure:   Allergies were reviewed and the patient is allergic to Patient has no known allergies..   After the risks, benefits, complications and alternatives were discussed with the patient, appropriate informed consent was obtained and the patient was brought to the operating suite. The patient was reclined supine and a timeout was conducted.  The patient was then sedated.  Local anesthetic consisting of a 50-50 mixture of 2% lidocaine with epinephrine and 0.75% bupivacaine with added Hylenex was injected subcutaneously to both upper eyelid(s). After adequate local was instilled, the patient was converted to general anesthesia with an LMA.  Then the patient was prepped and draped in the usual sterile fashion for eyelid surgery.   Attention was turned to the upper eyelids. A 11m upper eyelid crease incision line was marked with calipers on both upper eyelid(s).  A pinch test was used to estimate the amount of excess skin to remove and this was marked in standard blepharoplasty style fashion. Attention was turned to the right upper eyelid. A #15 blade was used to open the premarked incision line. A skin only flap was excised and hemostasis was obtained with bipolar cautery.   Attention was then turned to the opposite eyelid where the same procedure was performed in the same manner. Hemostasis was obtained with bipolar cautery throughout. All incisions were then closed with a combination of running and interrupted 6-0 fast absorbing plain suture.  The patient tolerated the procedure well.  Erythromycin ophthalmic ointment was applied to her incision sites, followed by ice packs. She was taken to the recovery area where she recovered without difficulty.  Post-Op Plan/Instructions:  The patient was instructed to use ice packs frequently for the next 48 hours. She was instructed to use erythromycin ophthalmic ointment on her incisions 4 times a day for the next 12 to 14 days. She was given a prescription for tramadol for pain control should Tylenol not be effective. [facility] [place] [place] [facility].  Joriel Streety M. FVickki Muff M.D. Attending,Ophthalmology

## 2017-04-16 NOTE — Transfer of Care (Signed)
Immediate Anesthesia Transfer of Care Note  Patient: Ruth Morgan  Procedure(s) Performed: BLEPHAROPLASTY UPPER EYELID: W/EXCESS SKIN (Bilateral Eye)  Patient Location: PACU  Anesthesia Type: MAC  Level of Consciousness: awake, alert  and patient cooperative  Airway and Oxygen Therapy: Patient Spontanous Breathing and Patient connected to supplemental oxygen  Post-op Assessment: Post-op Vital signs reviewed, Patient's Cardiovascular Status Stable, Respiratory Function Stable, Patent Airway and No signs of Nausea or vomiting  Post-op Vital Signs: Reviewed and stable  Complications: No apparent anesthesia complications

## 2017-04-16 NOTE — H&P (Signed)
See the history and physical completed at [facility] on 04/02/17 and scanned into the chart.

## 2017-04-16 NOTE — Anesthesia Preprocedure Evaluation (Addendum)
Anesthesia Evaluation  Patient identified by MRN, date of birth, ID band Patient awake    Reviewed: Allergy & Precautions, NPO status , Patient's Chart, lab work & pertinent test results  Airway Mallampati: II  TM Distance: >3 FB     Dental   Pulmonary neg recent URI, Current Smoker (recently started chantix and down to 5-6 cigarettes per day. Smoked today.),    breath sounds clear to auscultation       Cardiovascular Exercise Tolerance: Good (-) anginanegative cardio ROS   Rhythm:Regular Rate:Normal     Neuro/Psych  Headaches, Anxiety Takes suboxone for opioid addiction, but has been weaning off   GI/Hepatic negative GI ROS, neg GERD  ,  Endo/Other    Renal/GU      Musculoskeletal  (+) Arthritis ,   Abdominal   Peds  Hematology   Anesthesia Other Findings   Reproductive/Obstetrics                           Anesthesia Physical Anesthesia Plan  ASA: II  Anesthesia Plan: MAC   Post-op Pain Management:    Induction: Intravenous  PONV Risk Score and Plan: Propofol infusion and Midazolam  Airway Management Planned: Nasal Cannula  Additional Equipment:   Intra-op Plan:   Post-operative Plan:   Informed Consent: I have reviewed the patients History and Physical, chart, labs and discussed the procedure including the risks, benefits and alternatives for the proposed anesthesia with the patient or authorized representative who has indicated his/her understanding and acceptance.   Dental advisory given  Plan Discussed with: CRNA  Anesthesia Plan Comments: (Black coffee at 8:00. Will wait until 10:00.)       Anesthesia Quick Evaluation

## 2017-04-16 NOTE — Anesthesia Postprocedure Evaluation (Signed)
Anesthesia Post Note  Patient: Ruth Morgan  Procedure(s) Performed: BLEPHAROPLASTY UPPER EYELID: W/EXCESS SKIN (Bilateral Eye)  Patient location during evaluation: PACU Anesthesia Type: MAC Level of consciousness: awake and alert Pain management: pain level controlled Vital Signs Assessment: post-procedure vital signs reviewed and stable Respiratory status: spontaneous breathing, nonlabored ventilation, respiratory function stable and patient connected to nasal cannula oxygen Cardiovascular status: stable and blood pressure returned to baseline Postop Assessment: no apparent nausea or vomiting Anesthetic complications: no    Veda Canning

## 2017-04-17 ENCOUNTER — Encounter: Payer: Self-pay | Admitting: Ophthalmology

## 2017-06-03 ENCOUNTER — Ambulatory Visit
Admission: EM | Admit: 2017-06-03 | Discharge: 2017-06-03 | Disposition: A | Discharge status: Home / Self Care | Payer: BLUE CROSS/BLUE SHIELD | Attending: Family Medicine | Admitting: Family Medicine

## 2017-06-03 ENCOUNTER — Other Ambulatory Visit: Payer: Self-pay

## 2017-06-03 DIAGNOSIS — K4090 Unilateral inguinal hernia, without obstruction or gangrene, not specified as recurrent: Secondary | ICD-10-CM | POA: Diagnosis not present

## 2017-06-03 DIAGNOSIS — S39012A Strain of muscle, fascia and tendon of lower back, initial encounter: Secondary | ICD-10-CM

## 2017-06-03 MED ORDER — TRAMADOL HCL 50 MG PO TABS: ORAL_TABLET | ORAL | 0 refills | Status: DC

## 2017-06-03 MED ORDER — CYCLOBENZAPRINE HCL 10 MG PO TABS: 10.0000 mg | ORAL_TABLET | Freq: Every day | ORAL | 0 refills | Status: DC

## 2017-06-03 NOTE — ED Triage Notes (Signed)
Patient states that she has a hernia on left side of groin. Patient states that she has noticed some back pain.

## 2017-06-03 NOTE — ED Provider Notes (Signed)
[facility]    CSN: [id number] Arrival date & time: 06/03/17  1438     History   Chief Complaint Chief Complaint  Patient presents with  . Hernia    HPI Ruth Morgan is a 62 y.o. female.   62 yo female with a c/o low back pain for the past week after doing some cleaning work requiring bending and kneeling. Also has a h/o left inguinal hernia that seems to be getting bigger. Denies any pain to the hernia area.   The history is provided by the patient.    Past Medical History:  Diagnosis Date  . Anxiety state, unspecified 12/05/2012  . Carpal tunnel syndrome    right - repaired. Left - not repaired  . Colon polyp 2010   at [facility]  . DDD (degenerative disc disease), lumbar    by MRI  . Headache   . History of shingles 2013  . Hyperglycemia   . Prediabetes 12/05/2012  . Smoker 12/05/2012    Patient Active Problem List   Diagnosis Date Noted  . Lesion of lip 05/04/2013  . Right knee pain 05/04/2013  . Healthcare maintenance 01/02/2013  . Prediabetes 12/05/2012  . Smoker 12/05/2012  . Anxiety state, unspecified 12/05/2012  . Headache(784.0) 12/05/2012    Past Surgical History:  Procedure Laterality Date  . APPENDECTOMY  1972  . BROW LIFT Bilateral 04/16/2017   Procedure: BLEPHAROPLASTY UPPER EYELID: W/EXCESS SKIN;  Surgeon: Karle Starch, MD;  Location: [place];  Service: Ophthalmology;  Laterality: Bilateral;  . CARPAL TUNNEL RELEASE Right 2014  . LASIK  2006  . NASAL SINUS SURGERY  2004  . TOTAL ABDOMINAL HYSTERECTOMY W/ BILATERAL SALPINGOOPHORECTOMY  1985   benign ovarian tumors    OB History   None      Home Medications    Prior to Admission medications   Medication Sig Start Date End Date Taking? Authorizing Provider  amphetamine-dextroamphetamine (ADDERALL) 30 MG tablet Take 30 mg by mouth daily as needed.   Yes [provider]  buprenorphine-naloxone (SUBOXONE) 8-2 mg SUBL SL tablet Place 1 tablet under the  tongue daily.   Yes [provider]  Cyanocobalamin (VITAMIN B-12 PO) Take by mouth daily.   Yes [provider]  cyclobenzaprine (FLEXERIL) 10 MG tablet Take 1 tablet (10 mg total) by mouth at bedtime. 06/03/17   Norval Gable, MD  erythromycin [facility]) ophthalmic ointment Use a small amount on your sutures 4 times a day for the next 2 weeks. Switch to Aquaphor ointment should allergy develop. 04/16/17   Karle Starch, MD  traMADol Veatrice Bourbon) 50 MG tablet 1 tab po tid prn 06/03/17   Norval Gable, MD  varenicline (CHANTIX) 1 MG tablet Take 0.5 mg by mouth 2 (two) times daily.    [provider]    Family History Family History  Problem Relation Age of Onset  . Diabetes Sister   . Diabetes Brother   . Cancer Father        stomach  . CAD Father 42       MI  . Kidney failure Father        ESRD on HD  . Hyperlipidemia Father   . Cancer Paternal Grandfather        throat  . Cancer Paternal Grandmother        ovarian  . CAD Sister        MI  . CAD Brother        MI  .  Hypertension Mother     Social History Social History   Tobacco Use  . Smoking status: Current Every Day Smoker    Packs/day: 0.50    Years: 40.00    Pack years: 20.00    Types: Cigarettes  . Smokeless tobacco: Never Used  Substance Use Topics  . Alcohol use: Yes    Comment: rare  . Drug use: No     Allergies   Patient has no known allergies.   Review of Systems Review of Systems   Physical Exam Triage Vital Signs ED Triage Vitals  Enc Vitals Group     BP 06/03/17 1500 (!) 152/94     Pulse Rate 06/03/17 1500 97     Resp 06/03/17 1500 18     Temp 06/03/17 1500 97.8 F (36.6 C)     Temp Source 06/03/17 1500 Oral     SpO2 06/03/17 1500 95 %     Weight 06/03/17 1458 115 lb (52.2 kg)     Height 06/03/17 1458 5\' 1"  (1.549 m)     Head Circumference --      Peak Flow --      Pain Score 06/03/17 1457 8     Pain Loc --      Pain Edu? --      Excl. in [place]? --    No data  found.  Updated Vital Signs BP (!) 152/94 (BP Location: Right Arm)   Pulse 97   Temp 97.8 F (36.6 C) (Oral)   Resp 18   Ht 5\' 1"  (1.549 m)   Wt 115 lb (52.2 kg)   SpO2 95%   BMI 21.73 kg/m   Visual Acuity Right Eye Distance:   Left Eye Distance:   Bilateral Distance:    Right Eye Near:   Left Eye Near:    Bilateral Near:     Physical Exam  Constitutional: She appears well-developed and well-nourished. No distress.  Abdominal: Soft. Normal appearance and bowel sounds are normal. She exhibits no distension and no mass. There is no tenderness. There is no rebound and no guarding. A hernia is present. Hernia confirmed positive in the left inguinal area (reducible).  Musculoskeletal: She exhibits tenderness. She exhibits no edema.       Lumbar back: She exhibits tenderness and spasm. She exhibits normal range of motion, no bony tenderness, no swelling, no edema, no deformity, no laceration, no pain and normal pulse.  Neurological: She is alert. She has normal reflexes. She displays normal reflexes. She exhibits normal muscle tone.  Skin: Skin is warm and dry. No rash noted. She is not diaphoretic. No erythema.  Nursing note and vitals reviewed.    UC Treatments / Results  Labs (all labs ordered are listed, but only abnormal results are displayed) Labs Reviewed - No data to display  EKG None  Radiology No results found.  Procedures Procedures (including critical care time)  Medications Ordered in UC Medications - No data to display  Initial Impression / Assessment and Plan / UC Course  I have reviewed the triage vital signs and the nursing notes.  Pertinent labs & imaging results that were available during my care of the patient were reviewed by me and considered in my medical decision making (see chart for details).      Final Clinical Impressions(s) / UC Diagnoses   Final diagnoses:  Strain of lumbar region, initial encounter  Inguinal hernia, left    Discharge Instructions   None    ED  Prescriptions    Medication Sig Dispense Auth. Provider   traMADol (ULTRAM) 50 MG tablet 1 tab po tid prn 6 tablet Norval Gable, MD   cyclobenzaprine (FLEXERIL) 10 MG tablet Take 1 tablet (10 mg total) by mouth at bedtime. 30 tablet Norval Gable, MD     1. diagnosis reviewed with patient 2. rx as per orders above; reviewed possible side effects, interactions, risks and benefits  3. Recommend supportive treatment with heat to low back area, stretches 4. Follow up with general surgeon for inguinal hernia 5. Follow-up prn if symptoms worsen or don't improve   Controlled Substance Prescriptions [place] Controlled Substance Registry consulted? Not Applicable   Norval Gable, MD 06/03/17 1700

## 2017-06-25 DIAGNOSIS — B029 Zoster without complications: Secondary | ICD-10-CM

## 2017-06-25 HISTORY — DX: Zoster without complications: B02.9

## 2017-07-07 ENCOUNTER — Encounter: Payer: Self-pay | Admitting: Emergency Medicine

## 2017-07-07 DIAGNOSIS — F17210 Nicotine dependence, cigarettes, uncomplicated: Secondary | ICD-10-CM | POA: Insufficient documentation

## 2017-07-07 DIAGNOSIS — B0229 Other postherpetic nervous system involvement: Secondary | ICD-10-CM | POA: Diagnosis not present

## 2017-07-07 DIAGNOSIS — Z79899 Other long term (current) drug therapy: Secondary | ICD-10-CM | POA: Insufficient documentation

## 2017-07-07 DIAGNOSIS — R1031 Right lower quadrant pain: Secondary | ICD-10-CM | POA: Diagnosis present

## 2017-07-07 LAB — URINALYSIS, COMPLETE (UACMP) WITH MICROSCOPIC
Bacteria, UA: NONE SEEN
Bilirubin Urine: NEGATIVE
Glucose, UA: NEGATIVE mg/dL
KETONES UR: NEGATIVE mg/dL
Leukocytes, UA: NEGATIVE
Nitrite: NEGATIVE
PH: 5 (ref 5.0–8.0)
Protein, ur: NEGATIVE mg/dL
SPECIFIC GRAVITY, URINE: 1.018 (ref 1.005–1.030)

## 2017-07-07 LAB — COMPREHENSIVE METABOLIC PANEL
ALBUMIN: 4.3 g/dL (ref 3.5–5.0)
ALK PHOS: 77 U/L (ref 38–126)
ALT: 25 U/L (ref 0–44)
ANION GAP: 7 (ref 5–15)
AST: 19 U/L (ref 15–41)
BUN: 22 mg/dL (ref 8–23)
CALCIUM: 9.2 mg/dL (ref 8.9–10.3)
CO2: 27 mmol/L (ref 22–32)
Chloride: 106 mmol/L (ref 98–111)
Creatinine, Ser: 0.51 mg/dL (ref 0.44–1.00)
GFR calc Af Amer: >60 mL/min (ref >60)
GFR calc non Af Amer: >60 mL/min (ref >60)
GLUCOSE: 127 mg/dL — AB (ref 70–99)
POTASSIUM: 4.7 mmol/L (ref 3.5–5.1)
Sodium: 140 mmol/L (ref 135–145)
Total Bilirubin: 0.5 mg/dL (ref 0.3–1.2)
Total Protein: 7.8 g/dL (ref 6.5–8.1)

## 2017-07-07 LAB — CBC
HEMATOCRIT: 43.8 % (ref 35.0–47.0)
HEMOGLOBIN: 14.9 g/dL (ref 12.0–16.0)
MCH: 31.9 pg (ref 26.0–34.0)
MCHC: 34 g/dL (ref 32.0–36.0)
MCV: 93.8 fL (ref 80.0–100.0)
PLATELETS: 339 10*3/uL (ref 150–440)
RBC: 4.67 MIL/uL (ref 3.80–5.20)
RDW: 14.3 % (ref 11.5–14.5)
WBC: 9.3 10*3/uL (ref 3.6–11.0)

## 2017-07-07 NOTE — ED Triage Notes (Signed)
Patient with complaint of right flank pain times two months. Patient states that she has been seen by two EDs, urgernt care and PCP and was told it was sciatic nerve. Patient reports that about 2 weeks ago that she developed a rash to left lower back and abdomen and told that it was shingles. Patient states that the shingles are improving but that she continues to have pain.

## 2017-07-08 ENCOUNTER — Emergency Department
Admission: EM | Admit: 2017-07-08 | Discharge: 2017-07-08 | Disposition: A | Discharge status: Home / Self Care | Payer: BLUE CROSS/BLUE SHIELD | Attending: Emergency Medicine | Admitting: Emergency Medicine

## 2017-07-08 DIAGNOSIS — B0229 Other postherpetic nervous system involvement: Secondary | ICD-10-CM

## 2017-07-08 MED ORDER — GABAPENTIN 100 MG PO CAPS: 100.0000 mg | ORAL_CAPSULE | Freq: Three times a day (TID) | ORAL | 0 refills | Status: DC

## 2017-07-08 MED ORDER — ETODOLAC 200 MG PO CAPS: 200.0000 mg | ORAL_CAPSULE | Freq: Three times a day (TID) | ORAL | 0 refills | Status: DC

## 2017-07-08 MED ORDER — GABAPENTIN 600 MG PO TABS
300.0000 mg | ORAL_TABLET | Freq: Once | ORAL | Status: AC
  Administered 2017-07-08: 300 mg via ORAL
  Filled 2017-07-08: qty 1

## 2017-07-08 MED ORDER — KETOROLAC TROMETHAMINE 60 MG/2ML IM SOLN
60.0000 mg | Freq: Once | INTRAMUSCULAR | Status: AC
  Administered 2017-07-08: 60 mg via INTRAMUSCULAR
  Filled 2017-07-08: qty 2

## 2017-07-08 NOTE — ED Provider Notes (Signed)
[facility] Emergency Department Provider Note   ____________________________________________   First MD Initiated Contact with Patient 07/08/17 [phone]     (approximate)  I have reviewed the triage vital signs and the nursing notes.   HISTORY  Chief Complaint Flank Pain    HPI Ruth Morgan is a 62 y.o. female who comes into the hospital today with pain for the last 2 months.  She reports that she was hurting around her back and her right groin for the last 2 months.  The patient reports that she went back and forth to multiple ERs and clinics until the rash appeared and she was diagnosed with shingles.  She states that her primary care physician gave her some medicine and the shingles seems to be getting better but the pain has not gone away.  She reports that she is tried taking Percocet, Aleve and pain patches all to no avail.  The patient reports that her right hip seems to stick out more than the left and her pain is very severe.  The patient states it is worse at night.  She rates her pain a 10 out of 10 in intensity.  The patient states that walking seems to make the pain better so she is pacing constantly and unable to tolerate the symptoms.  She is here today for evaluation.   Past Medical History:  Diagnosis Date  . Anxiety state, unspecified 12/05/2012  . Carpal tunnel syndrome    right - repaired. Left - not repaired  . Colon polyp 2010   at [facility]  . DDD (degenerative disc disease), lumbar    by MRI  . Headache   . History of shingles 2013  . Hyperglycemia   . Prediabetes 12/05/2012  . Smoker 12/05/2012    Patient Active Problem List   Diagnosis Date Noted  . Lesion of lip 05/04/2013  . Right knee pain 05/04/2013  . Healthcare maintenance 01/02/2013  . Prediabetes 12/05/2012  . Smoker 12/05/2012  . Anxiety state, unspecified 12/05/2012  . Headache(784.0) 12/05/2012    Past Surgical History:  Procedure Laterality Date  .  APPENDECTOMY  1972  . BROW LIFT Bilateral 04/16/2017   Procedure: BLEPHAROPLASTY UPPER EYELID: W/EXCESS SKIN;  Surgeon: Karle Starch, MD;  Location: [place];  Service: Ophthalmology;  Laterality: Bilateral;  . CARPAL TUNNEL RELEASE Right 2014  . LASIK  2006  . NASAL SINUS SURGERY  2004  . TOTAL ABDOMINAL HYSTERECTOMY W/ BILATERAL SALPINGOOPHORECTOMY  1985   benign ovarian tumors    Prior to Admission medications   Medication Sig Start Date End Date Taking? Authorizing Provider  amphetamine-dextroamphetamine (ADDERALL) 30 MG tablet Take 30 mg by mouth daily as needed.    [provider]  buprenorphine-naloxone (SUBOXONE) 8-2 mg SUBL SL tablet Place 1 tablet under the tongue daily.    [provider]  Cyanocobalamin (VITAMIN B-12 PO) Take by mouth daily.    [provider]  cyclobenzaprine (FLEXERIL) 10 MG tablet Take 1 tablet (10 mg total) by mouth at bedtime. 06/03/17   Norval Gable, MD  erythromycin [facility]) ophthalmic ointment Use a small amount on your sutures 4 times a day for the next 2 weeks. Switch to Aquaphor ointment should allergy develop. 04/16/17   Karle Starch, MD  etodolac (LODINE) 200 MG capsule Take 1 capsule (200 mg total) by mouth every 8 (eight) hours. 07/08/17   Loney Hering, MD  gabapentin (NEURONTIN) 100 MG capsule Take 1 capsule (100 mg  total) by mouth 3 (three) times daily. 07/08/17 07/08/18  Loney Hering, MD  traMADol Veatrice Bourbon) 50 MG tablet 1 tab po tid prn 06/03/17   Norval Gable, MD  varenicline (CHANTIX) 1 MG tablet Take 0.5 mg by mouth 2 (two) times daily.    [provider]    Allergies Patient has no known allergies.  Family History  Problem Relation Age of Onset  . Diabetes Sister   . Diabetes Brother   . Cancer Father        stomach  . CAD Father 15       MI  . Kidney failure Father        ESRD on HD  . Hyperlipidemia Father   . Cancer Paternal Grandfather        throat  . Cancer Paternal  Grandmother        ovarian  . CAD Sister        MI  . CAD Brother        MI  . Hypertension Mother     Social History Social History   Tobacco Use  . Smoking status: Current Every Day Smoker    Packs/day: 0.50    Years: 40.00    Pack years: 20.00    Types: Cigarettes  . Smokeless tobacco: Never Used  Substance Use Topics  . Alcohol use: Yes    Comment: rare  . Drug use: No    Review of Systems  Constitutional: No fever/chills Eyes: No visual changes. ENT: No sore throat. Cardiovascular: Denies chest pain. Respiratory: Denies shortness of breath. Gastrointestinal: No abdominal pain.  No nausea, no vomiting.  No diarrhea.  No constipation. Genitourinary: Negative for dysuria. Musculoskeletal: Pain to low back wrapping around to groin and right leg Skin: Healing vesicular rash to right hip Neurological: Negative for headaches, focal weakness or numbness.   ____________________________________________   PHYSICAL EXAM:  VITAL SIGNS: ED Triage Vitals  Enc Vitals Group     BP 07/07/17 2314 (!) 177/108     Pulse Rate 07/07/17 2314 (!) 116     Resp 07/07/17 2314 18     Temp 07/07/17 2314 98.1 F (36.7 C)     Temp Source 07/07/17 2314 Oral     SpO2 07/07/17 2314 99 %     Weight 07/07/17 2315 115 lb (52.2 kg)     Height 07/07/17 2315 5\' 1"  (1.549 m)     Head Circumference --      Peak Flow --      Pain Score 07/07/17 2315 10     Pain Loc --      Pain Edu? --      Excl. in [place]? --     Constitutional: Alert and oriented. Well appearing and in moderate distress. Eyes: Conjunctivae are normal. PERRL. EOMI. Head: Atraumatic. Nose: No congestion/rhinnorhea. Mouth/Throat: Mucous membranes are moist.  Oropharynx non-erythematous. Cardiovascular: Normal rate, regular rhythm. Grossly normal heart sounds.  Good peripheral circulation. Respiratory: Normal respiratory effort.  No retractions. Lungs CTAB. Gastrointestinal: Soft and nontender. No distention.  Positive  bowel sounds Musculoskeletal: No lower extremity tenderness nor edema.   Neurologic:  Normal speech and language.  Skin:  Skin is warm, dry and intact.  Healing vesicular rash to the right hip with some eschars. Psychiatric: Mood and affect are normal.   ____________________________________________   LABS (all labs ordered are listed, but only abnormal results are displayed)  Labs Reviewed  COMPREHENSIVE METABOLIC PANEL - Abnormal; Notable for the following components:  Result Value   Glucose, Bld 127 (*)    All other components within normal limits  URINALYSIS, COMPLETE (UACMP) WITH MICROSCOPIC - Abnormal; Notable for the following components:   Color, Urine YELLOW (*)    APPearance CLEAR (*)    Hgb urine dipstick SMALL (*)    All other components within normal limits  CBC   ____________________________________________  EKG  none ____________________________________________  RADIOLOGY  ED MD interpretation:  none  Official radiology report(s): No results found.  ____________________________________________   PROCEDURES  Procedure(s) performed: None  Procedures  Critical Care performed: No  ____________________________________________   INITIAL IMPRESSION / ASSESSMENT AND PLAN / ED COURSE  As part of my medical decision making, I reviewed the following data within the electronic MEDICAL RECORD NUMBER Notes from prior ED visits and [place] Controlled Substance Database   This is a 62 year old female who comes into the hospital today with some right-sided hip pain. The patient has been having this pain on and off for multiple months.  It appears that the patient has had varicella-zoster and is continuing to have sharp severe pains to that side.  I am concerned about postherpetic neuralgia.  The area does not appear inflamed.  She has no spreading superinfection or cellulitis.  There was some blood work drawn and evaluated.  The patient CBC and CMP were unremarkable.   The patient's urinalysis was also negative.  I did give the patient a shot of Toradol as well as some gabapentin.  I feel that the patient needs to be on gabapentin to help with her postherpetic neuralgia.  She should follow-up with her primary care physician for further evaluation of her symptoms.  The patient will be discharged home.      ____________________________________________   FINAL CLINICAL IMPRESSION(S) / ED DIAGNOSES  Final diagnoses:  Post herpetic neuralgia     ED Discharge Orders        Ordered    gabapentin (NEURONTIN) 100 MG capsule  3 times daily     07/08/17 0202    etodolac (LODINE) 200 MG capsule  Every 8 hours     07/08/17 0202       Note:  This document was prepared using Dragon voice recognition software and may include unintentional dictation errors.   Loney Hering, MD 07/08/17 [phone]

## 2017-07-08 NOTE — Discharge Instructions (Signed)
Please follow up with your primary care physician for further evaluation of your pain. Please return with any worsened condition or any other concerns.

## 2017-07-08 NOTE — ED Notes (Signed)
Pt verbalizes understanding of discharge instructions.

## 2018-02-17 ENCOUNTER — Other Ambulatory Visit: Payer: Self-pay

## 2018-02-17 ENCOUNTER — Emergency Department
Admission: EM | Admit: 2018-02-17 | Discharge: 2018-02-17 | Disposition: A | Discharge status: Home / Self Care | Payer: BLUE CROSS/BLUE SHIELD | Attending: Emergency Medicine | Admitting: Emergency Medicine

## 2018-02-17 ENCOUNTER — Encounter: Payer: Self-pay | Admitting: Emergency Medicine

## 2018-02-17 ENCOUNTER — Emergency Department: Payer: BLUE CROSS/BLUE SHIELD

## 2018-02-17 DIAGNOSIS — R079 Chest pain, unspecified: Secondary | ICD-10-CM | POA: Insufficient documentation

## 2018-02-17 DIAGNOSIS — Z5321 Procedure and treatment not carried out due to patient leaving prior to being seen by health care provider: Secondary | ICD-10-CM | POA: Diagnosis not present

## 2018-02-17 LAB — TROPONIN I: Troponin I: <0.03 ng/mL (ref <0.03)

## 2018-02-17 LAB — BASIC METABOLIC PANEL
Anion gap: 6 (ref 5–15)
BUN: 21 mg/dL (ref 8–23)
CALCIUM: 9.3 mg/dL (ref 8.9–10.3)
CO2: 31 mmol/L (ref 22–32)
CREATININE: 0.79 mg/dL (ref 0.44–1.00)
Chloride: 102 mmol/L (ref 98–111)
GFR calc non Af Amer: >60 mL/min (ref >60)
GLUCOSE: 107 mg/dL — AB (ref 70–99)
Potassium: 3.9 mmol/L (ref 3.5–5.1)
Sodium: 139 mmol/L (ref 135–145)

## 2018-02-17 LAB — CBC
HCT: 45.1 % (ref 36.0–46.0)
Hemoglobin: 14.5 g/dL (ref 12.0–15.0)
MCH: 30.2 pg (ref 26.0–34.0)
MCHC: 32.2 g/dL (ref 30.0–36.0)
MCV: 94 fL (ref 80.0–100.0)
PLATELETS: 340 10*3/uL (ref 150–400)
RBC: 4.8 MIL/uL (ref 3.87–5.11)
RDW: 12.8 % (ref 11.5–15.5)
WBC: 6.9 10*3/uL (ref 4.0–10.5)
nRBC: 0 % (ref 0.0–0.2)

## 2018-02-17 NOTE — ED Notes (Signed)
Pt called in lobby, throughout RD with no response.

## 2018-02-17 NOTE — ED Notes (Signed)
Pt called to room without answer. Sub-wait, main lobby and restroom checked.

## 2018-02-17 NOTE — ED Triage Notes (Signed)
Says she went to urgent care for chest pain since yesterday.  When she has it it is sharp and lasts a few seconds.  Says it was worse last nightl  She does hava a cough.  Says pain increased with deep breath.

## 2018-02-17 NOTE — ED Notes (Signed)
Called for patient without answer 

## 2018-02-18 ENCOUNTER — Telehealth: Payer: Self-pay | Admitting: Emergency Medicine

## 2018-02-18 NOTE — Telephone Encounter (Signed)
Patient called due to lwot and she wanted to know how her labs were.  I explained lab work and emphasized that I cannot give her answers on why she feels bad or has chest pain.  I explained the importance of provider exam for diagnosis.  She is reluctant to return here and has no pcp.  I told her she may return any time, but that I leave that up to her as she is competent and I told her there is a risk of death if it is her heart.

## 2018-03-14 NOTE — Telephone Encounter (Signed)
Patient called me back and asking about labs.  She has appt with pcp on Tuesday.  I explained that there are no critical lab results, but that she cannot be diagnosed based on the labs and the importance of seeing physician.  She asked about her heart, and I explained that we cannot rule out heart problems since she did not see md

## 2018-09-19 ENCOUNTER — Other Ambulatory Visit: Payer: Self-pay

## 2018-09-19 ENCOUNTER — Encounter: Payer: Self-pay | Admitting: Emergency Medicine

## 2018-09-19 ENCOUNTER — Emergency Department: Payer: Medicare Other

## 2018-09-19 ENCOUNTER — Emergency Department
Admission: EM | Admit: 2018-09-19 | Discharge: 2018-09-19 | Disposition: A | Discharge status: Home / Self Care | Payer: Medicare Other | Attending: Emergency Medicine | Admitting: Emergency Medicine

## 2018-09-19 DIAGNOSIS — Z79899 Other long term (current) drug therapy: Secondary | ICD-10-CM | POA: Insufficient documentation

## 2018-09-19 DIAGNOSIS — K4090 Unilateral inguinal hernia, without obstruction or gangrene, not specified as recurrent: Secondary | ICD-10-CM | POA: Diagnosis not present

## 2018-09-19 DIAGNOSIS — F17210 Nicotine dependence, cigarettes, uncomplicated: Secondary | ICD-10-CM | POA: Insufficient documentation

## 2018-09-19 DIAGNOSIS — R1032 Left lower quadrant pain: Secondary | ICD-10-CM | POA: Diagnosis present

## 2018-09-19 LAB — URINALYSIS, COMPLETE (UACMP) WITH MICROSCOPIC
Bacteria, UA: NONE SEEN
Bilirubin Urine: NEGATIVE
Glucose, UA: NEGATIVE mg/dL
Hgb urine dipstick: NEGATIVE
Ketones, ur: NEGATIVE mg/dL
Leukocytes,Ua: NEGATIVE
Nitrite: NEGATIVE
Protein, ur: NEGATIVE mg/dL
Specific Gravity, Urine: 1.015 (ref 1.005–1.030)
Squamous Epithelial / LPF: NONE SEEN (ref 0–5)
pH: 6 (ref 5.0–8.0)

## 2018-09-19 LAB — COMPREHENSIVE METABOLIC PANEL
ALT: 25 U/L (ref 0–44)
AST: 18 U/L (ref 15–41)
Albumin: 4 g/dL (ref 3.5–5.0)
Alkaline Phosphatase: 89 U/L (ref 38–126)
Anion gap: 6 (ref 5–15)
BUN: 20 mg/dL (ref 8–23)
CO2: 30 mmol/L (ref 22–32)
Calcium: 9.2 mg/dL (ref 8.9–10.3)
Chloride: 101 mmol/L (ref 98–111)
Creatinine, Ser: 0.82 mg/dL (ref 0.44–1.00)
GFR calc Af Amer: >60 mL/min (ref >60)
GFR calc non Af Amer: >60 mL/min (ref >60)
Glucose, Bld: 93 mg/dL (ref 70–99)
Potassium: 4.2 mmol/L (ref 3.5–5.1)
Sodium: 137 mmol/L (ref 135–145)
Total Bilirubin: 0.5 mg/dL (ref 0.3–1.2)
Total Protein: 7.3 g/dL (ref 6.5–8.1)

## 2018-09-19 LAB — CBC WITH DIFFERENTIAL/PLATELET
Abs Immature Granulocytes: 0.1 10*3/uL — ABNORMAL HIGH (ref 0.00–0.07)
Basophils Absolute: 0.1 10*3/uL (ref 0.0–0.1)
Basophils Relative: 1 %
Eosinophils Absolute: 0.7 10*3/uL — ABNORMAL HIGH (ref 0.0–0.5)
Eosinophils Relative: 6 %
HCT: 44.5 % (ref 36.0–46.0)
Hemoglobin: 14.3 g/dL (ref 12.0–15.0)
Immature Granulocytes: 1 %
Lymphocytes Relative: 28 %
Lymphs Abs: 3.2 10*3/uL (ref 0.7–4.0)
MCH: 30.2 pg (ref 26.0–34.0)
MCHC: 32.1 g/dL (ref 30.0–36.0)
MCV: 93.9 fL (ref 80.0–100.0)
Monocytes Absolute: 0.9 10*3/uL (ref 0.1–1.0)
Monocytes Relative: 8 %
Neutro Abs: 6.5 10*3/uL (ref 1.7–7.7)
Neutrophils Relative %: 56 %
Platelets: 405 10*3/uL — ABNORMAL HIGH (ref 150–400)
RBC: 4.74 MIL/uL (ref 3.87–5.11)
RDW: 13.1 % (ref 11.5–15.5)
WBC: 11.4 10*3/uL — ABNORMAL HIGH (ref 4.0–10.5)
nRBC: 0 % (ref 0.0–0.2)

## 2018-09-19 LAB — LIPASE, BLOOD: Lipase: 34 U/L (ref 11–51)

## 2018-09-19 MED ORDER — ONDANSETRON HCL 4 MG/2ML IJ SOLN
4.0000 mg | Freq: Once | INTRAMUSCULAR | Status: AC
  Administered 2018-09-19: 4 mg via INTRAVENOUS
  Filled 2018-09-19: qty 2

## 2018-09-19 MED ORDER — IOHEXOL 300 MG/ML  SOLN
75.0000 mL | Freq: Once | INTRAMUSCULAR | Status: AC | PRN
  Administered 2018-09-19: 75 mL via INTRAVENOUS

## 2018-09-19 MED ORDER — MORPHINE SULFATE (PF) 2 MG/ML IV SOLN
2.0000 mg | Freq: Once | INTRAVENOUS | Status: AC
Start: 2018-09-19 — End: 2018-09-19
  Administered 2018-09-19: 03:00:00 2 mg via INTRAVENOUS
  Filled 2018-09-19: qty 1

## 2018-09-19 MED ORDER — IOHEXOL 9 MG/ML PO SOLN
500.0000 mL | ORAL | Status: AC
  Administered 2018-09-19: 03:00:00 500 mL via ORAL

## 2018-09-19 MED ORDER — TRAMADOL HCL 50 MG PO TABS: 50.0000 mg | ORAL_TABLET | Freq: Four times a day (QID) | ORAL | 0 refills | Status: AC | PRN

## 2018-09-19 NOTE — ED Notes (Signed)
Pt done with contrast, CT aware.  

## 2018-09-19 NOTE — ED Notes (Signed)
Pt awaiting CT.  

## 2018-09-19 NOTE — ED Provider Notes (Signed)
[facility] Emergency Department Provider Note ______   First MD Initiated Contact with Patient 09/19/18 [phone]     (approximate)  I have reviewed the triage vital signs and the nursing notes.   HISTORY  Chief Complaint Abdominal Pain   HPI Ruth Morgan is a 63 y.o. female with below listed previous medical conditions including known left inguinal hernia presents to the emergency department secondary to persistent left lower abdomen pain since 6 PM last night.  Patient denies any nausea or vomiting.  Patient denies any diarrhea constipation.  Patient denies any urinary symptoms.  Patient states current pain score 7 out of 10.        Past Medical History:  Diagnosis Date   Anxiety state, unspecified 12/05/2012   Carpal tunnel syndrome    right - repaired. Left - not repaired   Colon polyp 2010   at [facility]   DDD (degenerative disc disease), lumbar    by MRI   Headache    History of shingles 2013   Hyperglycemia    Prediabetes 12/05/2012   Smoker 12/05/2012    Patient Active Problem List   Diagnosis Date Noted   Lesion of lip 05/04/2013   Right knee pain 05/04/2013   Healthcare maintenance 01/02/2013   Prediabetes 12/05/2012   Smoker 12/05/2012   Anxiety state, unspecified 12/05/2012   Headache(784.0) 12/05/2012    Past Surgical History:  Procedure Laterality Date   APPENDECTOMY  1972   BROW LIFT Bilateral 04/16/2017   Procedure: BLEPHAROPLASTY UPPER EYELID: W/EXCESS SKIN;  Surgeon: Karle Starch, MD;  Location: [place];  Service: Ophthalmology;  Laterality: Bilateral;   CARPAL TUNNEL RELEASE Right 2014   LASIK  2006   NASAL SINUS SURGERY  2004   TOTAL ABDOMINAL HYSTERECTOMY W/ BILATERAL SALPINGOOPHORECTOMY  1985   benign ovarian tumors    Prior to Admission medications   Medication Sig Start Date End Date Taking? Authorizing Provider  amphetamine-dextroamphetamine (ADDERALL) 30 MG tablet Take 30 mg  by mouth daily as needed.    [provider]  buprenorphine-naloxone (SUBOXONE) 8-2 mg SUBL SL tablet Place 1 tablet under the tongue daily.    [provider]  Cyanocobalamin (VITAMIN B-12 PO) Take by mouth daily.    [provider]  cyclobenzaprine (FLEXERIL) 10 MG tablet Take 1 tablet (10 mg total) by mouth at bedtime. 06/03/17   Norval Gable, MD  erythromycin [facility]) ophthalmic ointment Use a small amount on your sutures 4 times a day for the next 2 weeks. Switch to Aquaphor ointment should allergy develop. 04/16/17   Karle Starch, MD  etodolac (LODINE) 200 MG capsule Take 1 capsule (200 mg total) by mouth every 8 (eight) hours. 07/08/17   Loney Hering, MD  gabapentin (NEURONTIN) 100 MG capsule Take 1 capsule (100 mg total) by mouth 3 (three) times daily. 07/08/17 07/08/18  Loney Hering, MD  traMADol Veatrice Bourbon) 50 MG tablet 1 tab po tid prn 06/03/17   Norval Gable, MD  traMADol (ULTRAM) 50 MG tablet Take 1 tablet (50 mg total) by mouth every 6 (six) hours as needed. 09/19/18 09/19/19  Gregor Hams, MD  varenicline (CHANTIX) 1 MG tablet Take 0.5 mg by mouth 2 (two) times daily.    [provider]    Allergies Patient has no known allergies.  Family History  Problem Relation Age of Onset   Diabetes Sister    Diabetes Brother    Cancer Father  stomach   CAD Father 107       MI   Kidney failure Father        ESRD on HD   Hyperlipidemia Father    Cancer Paternal Grandfather        throat   Cancer Paternal Grandmother        ovarian   CAD Sister        MI   CAD Brother        MI   Hypertension Mother     Social History Social History   Tobacco Use   Smoking status: Current Some Day Smoker    Packs/day: 0.50    Years: 40.00    Pack years: 20.00    Types: Cigarettes   Smokeless tobacco: Never Used   Tobacco comment: using patch  Substance Use Topics   Alcohol use: Yes    Comment: rare   Drug use: No     Review of Systems Constitutional: No fever/chills Eyes: No visual changes. ENT: No sore throat. Cardiovascular: Denies chest pain. Respiratory: Denies shortness of breath. Gastrointestinal: Positive for abdominal pain.  No nausea, no vomiting.  No diarrhea.  No constipation. Genitourinary: Negative for dysuria. Musculoskeletal: Negative for neck pain.  Negative for back pain. Integumentary: Negative for rash. Neurological: Negative for headaches, focal weakness or numbness.  ____________________________________________   PHYSICAL EXAM:  VITAL SIGNS: ED Triage Vitals  Enc Vitals Group     BP 09/19/18 0052 (!) 152/83     Pulse Rate 09/19/18 0052 95     Resp 09/19/18 0052 18     Temp 09/19/18 0052 97.7 F (36.5 C)     Temp Source 09/19/18 0052 Oral     SpO2 09/19/18 0052 100 %     Weight 09/19/18 0054 53.5 kg (118 lb)     Height 09/19/18 0054 1.575 m (5\' 2" )     Head Circumference --      Peak Flow --      Pain Score 09/19/18 0052 10     Pain Loc --      Pain Edu? --      Excl. in [place]? --     Constitutional: Alert and oriented.  Eyes: Conjunctivae are normal.  Mouth/Throat: Mucous membranes are moist. Neck: No stridor.  No meningeal signs.   Cardiovascular: Normal rate, regular rhythm. Good peripheral circulation. Grossly normal heart sounds. Respiratory: Normal respiratory effort.  No retractions. Gastrointestinal: Left lower quadrant/left inguinal pain with palpation.  No distention.  Musculoskeletal: No lower extremity tenderness nor edema. No gross deformities of extremities. Neurologic:  Normal speech and language. No gross focal neurologic deficits are appreciated.  Skin:  Skin is warm, dry and intact. Psychiatric: Mood and affect are normal. Speech and behavior are normal.  ____________________________________________   LABS (all labs ordered are listed, but only abnormal results are displayed)  Labs Reviewed  CBC WITH DIFFERENTIAL/PLATELET - Abnormal;  Notable for the following components:      Result Value   WBC 11.4 (*)    Platelets 405 (*)    Eosinophils Absolute 0.7 (*)    Abs Immature Granulocytes 0.10 (*)    All other components within normal limits  URINALYSIS, COMPLETE (UACMP) WITH MICROSCOPIC - Abnormal; Notable for the following components:   Color, Urine STRAW (*)    APPearance CLEAR (*)    All other components within normal limits  COMPREHENSIVE METABOLIC PANEL  LIPASE, BLOOD     RADIOLOGY I, [place] Mycah Formica, personally viewed and  evaluated these images (plain radiographs) as part of my medical decision making, as well as reviewing the written report by the radiologist.  ED MD interpretation: Left inguinal hernia containing congested or inflamed fat on CT abdomen pelvis interpretation.  Official radiology report(s): Ct Abdomen Pelvis W Contrast  Result Date: 09/19/2018 CLINICAL DATA:  Left lower quadrant pain.  Acute abdominal pain EXAM: CT ABDOMEN AND PELVIS WITH CONTRAST TECHNIQUE: Multidetector CT imaging of the abdomen and pelvis was performed using the standard protocol following bolus administration of intravenous contrast. CONTRAST:  93mL OMNIPAQUE IOHEXOL 300 MG/ML  SOLN COMPARISON:  None. FINDINGS: Lower chest:  No contributory findings. Hepatobiliary: No focal liver abnormality.No evidence of biliary obstruction or stone. Pancreas: Unremarkable. Spleen: Unremarkable. Adrenals/Urinary Tract: Negative adrenals. No hydronephrosis or stone. 4 cm simple right lower pole renal cyst. Unremarkable bladder. Stomach/Bowel:  No obstruction. Appendectomy. Vascular/Lymphatic: No acute vascular abnormality. No mass or adenopathy. Reproductive:Hysterectomy and possible oophorectomies. Other: Left inguinal hernia containing congested/stranded fat, likely the symptomatic finding. There is also fatty enlargement of the right inguinal canal. Musculoskeletal: No acute abnormalities. Disc degeneration with dextroscoliosis and L4-5 right  foraminal narrowing. IMPRESSION: Left inguinal hernia containing congested or inflamed fat, likely the symptomatic finding. Electronically Signed   By: Monte Fantasia M.D.   On: 09/19/2018 05:57     Procedures   ____________________________________________   INITIAL IMPRESSION / MDM / ASSESSMENT AND PLAN / ED COURSE  As part of my medical decision making, I reviewed the following data within the electronic MEDICAL RECORD NUMBER   63 year old female presented with above-stated history and physical exam secondary to left inguinal pain presenting concern for possible inguinal hernia with possible incarceration and less likely diverticulitis.  CT scan of the abdomen pelvis performed revealed evidence of inguinal hernia with congested/inflamed fat within it.  Patient given IV morphine in the emergency department resolution of pain.  Patient be prescribed tramadol for home with referral to Dr. Lysle Pearl general surgery  ____________________________________________  FINAL CLINICAL IMPRESSION(S) / ED DIAGNOSES  Final diagnoses:  Left inguinal hernia     MEDICATIONS GIVEN DURING THIS VISIT:  Medications  iohexol (OMNIPAQUE) 9 MG/ML oral solution 500 mL (500 mLs Oral Contrast Given 09/19/18 0311)  morphine 2 MG/ML injection 2 mg (2 mg Intravenous Given 09/19/18 0307)  ondansetron (ZOFRAN) injection 4 mg (4 mg Intravenous Given 09/19/18 0307)  iohexol (OMNIPAQUE) 300 MG/ML solution 75 mL (75 mLs Intravenous Contrast Given 09/19/18 0523)     ED Discharge Orders         Ordered    traMADol (ULTRAM) 50 MG tablet  Every 6 hours PRN     09/19/18 [id number]          *Please note:  Ruth Morgan was evaluated in Emergency Department on 09/19/2018 for the symptoms described in the history of present illness. She was evaluated in the context of the global COVID-19 pandemic, which necessitated consideration that the patient might be at risk for infection with the SARS-CoV-2 virus that causes COVID-19.  Institutional protocols and algorithms that pertain to the evaluation of patients at risk for COVID-19 are in a state of rapid change based on information released by regulatory bodies including the CDC and federal and state organizations. These policies and algorithms were followed during the patient's care in the ED.  Some ED evaluations and interventions may be delayed as a result of limited staffing during the pandemic.*  Note:  This document was prepared using Dragon voice recognition software and may include unintentional  dictation errors.   Gregor Hams, MD 09/19/18 [phone]

## 2018-09-19 NOTE — ED Notes (Signed)
Pt still drinking contrast, tolerating it well.

## 2018-09-19 NOTE — ED Notes (Signed)
Pt states pain is better and no co nausea. Pt drinking contrast without complaints, awaiting ct scan.

## 2018-09-19 NOTE — ED Notes (Signed)
Report received from [facility]. Patient care assumed. Patient/RN introduction complete. Will continue to monitor.

## 2018-09-19 NOTE — ED Triage Notes (Signed)
Patient ambulatory to triage with steady gait, without difficulty or distress noted, mask in place; pt reports lower abd pain since 6pm with no accomp symptoms

## 2018-09-19 NOTE — ED Notes (Signed)
Pt to CT

## 2018-09-19 NOTE — ED Notes (Signed)
Patient discharged to home per MD order. Patient in stable condition, and deemed medically cleared by ED provider for discharge. Discharge instructions reviewed with patient/family using "Teach Back"; verbalized understanding of medication education and administration, and information about follow-up care. Denies further concerns. ° °

## 2019-01-02 DIAGNOSIS — Z8616 Personal history of COVID-19: Secondary | ICD-10-CM

## 2019-01-02 HISTORY — DX: Personal history of COVID-19: Z86.16

## 2019-07-21 ENCOUNTER — Other Ambulatory Visit: Payer: Self-pay | Admitting: Family Medicine

## 2019-07-21 DIAGNOSIS — Z1231 Encounter for screening mammogram for malignant neoplasm of breast: Secondary | ICD-10-CM

## 2019-07-28 ENCOUNTER — Ambulatory Visit
Admission: RE | Admit: 2019-07-28 | Discharge: 2019-07-28 | Disposition: A | Discharge status: Home / Self Care | Payer: Medicare HMO | Source: Ambulatory Visit | Attending: Family Medicine | Admitting: Family Medicine

## 2019-07-28 ENCOUNTER — Encounter (INDEPENDENT_AMBULATORY_CARE_PROVIDER_SITE_OTHER): Payer: Self-pay

## 2019-07-28 ENCOUNTER — Other Ambulatory Visit: Payer: Self-pay

## 2019-07-28 DIAGNOSIS — Z1231 Encounter for screening mammogram for malignant neoplasm of breast: Secondary | ICD-10-CM | POA: Diagnosis present

## 2019-08-07 ENCOUNTER — Other Ambulatory Visit: Payer: Self-pay | Admitting: *Deleted

## 2019-08-07 ENCOUNTER — Inpatient Hospital Stay
Admission: RE | Admit: 2019-08-07 | Discharge: 2019-08-07 | Disposition: A | Discharge status: Home / Self Care | Payer: Self-pay | Source: Ambulatory Visit | Attending: *Deleted | Admitting: *Deleted

## 2019-08-07 DIAGNOSIS — Z1231 Encounter for screening mammogram for malignant neoplasm of breast: Secondary | ICD-10-CM

## 2019-08-26 ENCOUNTER — Encounter: Payer: Self-pay | Admitting: *Deleted

## 2019-08-26 ENCOUNTER — Telehealth: Payer: Self-pay | Admitting: *Deleted

## 2019-08-26 DIAGNOSIS — Z122 Encounter for screening for malignant neoplasm of respiratory organs: Secondary | ICD-10-CM

## 2019-08-26 DIAGNOSIS — Z87891 Personal history of nicotine dependence: Secondary | ICD-10-CM

## 2019-08-26 NOTE — Telephone Encounter (Signed)
Received referral for initial lung cancer screening scan. Contacted patient and obtained smoking history,(current, 34.5 pack year) as well as answering questions related to screening process. Patient denies signs of lung cancer such as weight loss or hemoptysis. Patient denies comorbidity that would prevent curative treatment if lung cancer were found. Patient is scheduled for shared decision making visit and CT scan on 09/01/19 at 145pm.

## 2019-09-01 ENCOUNTER — Ambulatory Visit
Admission: RE | Admit: 2019-09-01 | Discharge: 2019-09-01 | Disposition: A | Discharge status: Home / Self Care | Payer: Medicare HMO | Source: Ambulatory Visit | Attending: Nurse Practitioner | Admitting: Nurse Practitioner

## 2019-09-01 ENCOUNTER — Other Ambulatory Visit: Payer: Self-pay

## 2019-09-01 ENCOUNTER — Inpatient Hospital Stay: Payer: Medicare HMO | Attending: Nurse Practitioner | Admitting: Nurse Practitioner

## 2019-09-01 DIAGNOSIS — F17210 Nicotine dependence, cigarettes, uncomplicated: Secondary | ICD-10-CM | POA: Diagnosis not present

## 2019-09-01 DIAGNOSIS — Z122 Encounter for screening for malignant neoplasm of respiratory organs: Secondary | ICD-10-CM | POA: Insufficient documentation

## 2019-09-01 DIAGNOSIS — Z87891 Personal history of nicotine dependence: Secondary | ICD-10-CM | POA: Diagnosis not present

## 2019-09-01 NOTE — Progress Notes (Signed)
Virtual Visit via Video Enabled Telemedicine Note   I connected with Ruth Morgan on 09/01/19 at 3:30 PM EST by video enabled telemedicine visit and verified that I am speaking with the correct person using two identifiers.   I discussed the limitations, risks, security and privacy concerns of performing an evaluation and management service by telemedicine and the availability of in-person appointments. I also discussed with the patient that there may be a patient responsible charge related to this service. The patient expressed understanding and agreed to proceed.   Other persons participating in the visit and their role in the encounter: Burgess Estelle, RN- checking in patient & navigation  Patient's location: [place]  Provider's location: Clinic  Chief Complaint: Low Dose CT Screening  Patient agreed to evaluation by telemedicine to discuss shared decision making for consideration of low dose CT lung cancer screening.    In accordance with CMS guidelines, patient has met eligibility criteria including age, absence of signs or symptoms of lung cancer.  Social History   Tobacco Use   Smoking status: Current Some Day Smoker    Packs/day: 0.75    Years: 46.00    Pack years: 34.50    Types: Cigarettes   Smokeless tobacco: Never Used   Tobacco comment: .5 ppd currently  Substance Use Topics   Alcohol use: Yes    Comment: rare     A shared decision-making session was conducted prior to the performance of CT scan. This includes one or more decision aids, includes benefits and harms of screening, follow-up diagnostic testing, over-diagnosis, false positive rate, and total radiation exposure.   Counseling on the importance of adherence to annual lung cancer LDCT screening, impact of co-morbidities, and ability or willingness to undergo diagnosis and treatment is imperative for compliance of the program.   Counseling on the importance of continued smoking cessation for  former smokers; the importance of smoking cessation for current smokers, and information about tobacco cessation interventions have been given to patient including Elim and [employer] Quit [place] programs.   Written order for lung cancer screening with LDCT has been given to the patient and any and all questions have been answered to the best of my abilities.    Yearly follow up will be coordinated by Burgess Estelle, Thoracic Navigator.  I discussed the assessment and treatment plan with the patient. The patient was provided an opportunity to ask questions and all were answered. The patient agreed with the plan and demonstrated an understanding of the instructions.   The patient was advised to call back or seek an in-person evaluation if the symptoms worsen or if the condition fails to improve as anticipated.   I provided 15 minutes of face-to-face video visit time during this encounter, and > 50% was spent counseling as documented under my assessment & plan.   Beckey Rutter, DNP, AGNP-C [place] at [facility] [phone] (clinic)

## 2019-09-10 ENCOUNTER — Encounter: Payer: Self-pay | Admitting: *Deleted

## 2020-08-29 ENCOUNTER — Ambulatory Visit: Payer: Self-pay | Admitting: Surgery

## 2020-08-29 NOTE — H&P (Signed)
Subjective:    CC: Non-recurrent bilateral inguinal hernia without obstruction or gangrene [K40.20]   HPI:  Ruth Morgan is a 65 y.o. female who returns for evaluation of above. Pain and size of hernia has increased since last exam.  Also complains of pain in right groin now.     Past Medical History:  has a past medical history of Cataract (04/19/2014), COPD, mild , unspecified (CMS-HCC) (05/23/2018), DDD (degenerative disc disease), lumbar (05/01/2019), Gastroesophageal reflux disease (10/21/2017), Hyperlipidemia, and Hypertension, essential (04/16/2019).   Past Surgical History:       Past Surgical History:  Procedure Laterality Date   AMPUTATION FINGER / THUMB       COLONOSCOPY       EXTRACTION CATARACT EXTRACAPSULAR W/INSERTION INTRAOCULAR PROSTHESIS Right 05/04/2014    Procedure: R--EXTRACTION CATARACT EXTRACAPSULAR WITH PHACO WITH INSERTION INTRAOCULAR PROSTHESIS;  Surgeon: Shirlee More, MD;  Location: [place];  Service: Ophthalmology;  Laterality: Right;   EXTRACTION CATARACT EXTRACAPSULAR W/INSERTION INTRAOCULAR PROSTHESIS Left 06/09/2014    Procedure: L--EXTRACTION CATARACT EXTRACAPSULAR WITH PHACO WITH INSERTION INTRAOCULAR PROSTHESIS;  Surgeon: Shirlee More, MD;  Location: [place];  Service: Ophthalmology;  Laterality: Left;  pre phone 6/6-amf   FUNCTIONAL ENDOSCOPIC SINUS SURGERY       HYSTERECTOMY       HYSTERECTOMY VAGINAL W/REMOVAL TUBES &/OR OVARIES       PHOTOREFRACTIVE KERATOTOMY/LASIK Bilateral 2006    Dr. Maudie Mercury      Family History: family history includes Blindness in her sister.   Social History:  reports that she has been smoking cigarettes. She has a 15.00 pack-year smoking history. She has never used smokeless tobacco. She reports that she does not drink alcohol and does not use drugs.   Current Medications: has a current medication list which includes the following prescription(s): acetaminophen, albuterol, aspirin, aspirin-caffeine,  beclomethasone dipropionate, dextroamphetamine-amphetamine, lisinopril, rosuvastatin, varenicline, dextroamphetamine-amphetamine, estradiol, and gabapentin.   Allergies:     Allergies as of 08/29/2020   (No Known Allergies)      ROS:  A 15 point review of systems was performed and pertinent positives and negatives noted in HPI   Objective:    BP (!) 149/85   Pulse 103   Ht 157.5 cm ('5\' 2"'$ )   Wt 46.7 kg (103 lb)   BMI 18.84 kg/m    Constitutional :  alert, appears stated age, cooperative and no distress  Lymphatics/Throat:  no asymmetry, masses, or scars  Respiratory:  clear to auscultation bilaterally  Cardiovascular:  regular rate and rhythm  Gastrointestinal: soft, non-tender; bowel sounds normal; no masses,  no organomegaly. inguinal hernia noted.  small, reducible, no overlying skin changes and left, possible right  Musculoskeletal: Steady gait and movement  Skin: Cool and moist  Psychiatric: Normal affect, non-agitated, not confused         LABS:  n/a    RADS: CLINICAL DATA:  Left lower quadrant pain.  Acute abdominal pain   EXAM:  CT ABDOMEN AND PELVIS WITH CONTRAST   TECHNIQUE:  Multidetector CT imaging of the abdomen and pelvis was performed  using the standard protocol following bolus administration of  intravenous contrast.   CONTRAST:  29m OMNIPAQUE IOHEXOL 300 MG/ML  SOLN   COMPARISON:  None.   FINDINGS:  Lower chest:  No contributory findings.   Hepatobiliary: No focal liver abnormality.No evidence of biliary  obstruction or stone.   Pancreas: Unremarkable.   Spleen: Unremarkable.   Adrenals/Urinary Tract: Negative adrenals. No hydronephrosis or  stone. 4 cm  simple right lower pole renal cyst. Unremarkable  bladder.   Stomach/Bowel:  No obstruction. Appendectomy.   Vascular/Lymphatic: No acute vascular abnormality. No mass or  adenopathy.   Reproductive:Hysterectomy and possible oophorectomies.   Other: Left inguinal hernia containing  congested/stranded fat,  likely the symptomatic finding. There is also fatty enlargement of  the right inguinal canal.   Musculoskeletal: No acute abnormalities. Disc degeneration with  dextroscoliosis and L4-5 right foraminal narrowing.   IMPRESSION:  Left inguinal hernia containing congested or inflamed fat, likely  the symptomatic finding.    Electronically Signed    By: Monte Fantasia M.D.    On: 09/19/2018 05:57   Assessment:        Non-recurrent bilateral inguinal hernia without obstruction or gangrene [K40.20]   Plan:    1. Non-recurrent bilateral inguinal hernia without obstruction or gangrene [K40.20]   Discussed the risk of surgery including recurrence, which can be up to 50% in the case of incisional or complex hernias, possible use of prosthetic materials (mesh) and the increased risk of mesh infxn if used, bleeding, chronic pain, post-op infxn, post-op SBO or ileus, and possible re-operation to address said risks. The risks of general anesthetic, if used, includes MI, CVA, sudden death or even reaction to anesthetic medications also discussed. Alternatives include continued observation.  Benefits include possible symptom relief, prevention of incarceration, strangulation, enlargement in size over time, and the risk of emergency surgery in the face of strangulation.    Typical post-op recovery time of 3-5 days with 2 weeks of activity restrictions were also discussed.   ED return precautions given for sudden increase in pain, size of hernia with accompanying fever, nausea, and/or vomiting.   The patient verbalized understanding and all questions were answered to the patient's satisfaction.     2. Patient has elected to proceed with surgical treatment. Procedure will be scheduled.  At least two weeks out since she is actively smoking and also takes [place] powder on a daily basis.  She stated she will quit both starting today.  robotic assisted laparoscopic

## 2020-09-19 ENCOUNTER — Encounter: Payer: Self-pay | Admitting: Urgent Care

## 2020-09-19 ENCOUNTER — Other Ambulatory Visit: Payer: Self-pay

## 2020-09-19 ENCOUNTER — Encounter
Admission: RE | Admit: 2020-09-19 | Discharge: 2020-09-19 | Disposition: A | Discharge status: Home / Self Care | Payer: Medicare HMO | Source: Ambulatory Visit | Attending: Surgery | Admitting: Surgery

## 2020-09-19 NOTE — Patient Instructions (Addendum)
Your procedure is scheduled on: 09/29/2020  Report to the Registration Desk on the 1st floor of the [facility]. To find out your arrival time, please call [phone] between 1PM - 3PM on: 09/28/2020  REMEMBER: Instructions that are not followed completely may result in serious medical risk, up to and including death; or upon the discretion of your surgeon and anesthesiologist your surgery may need to be rescheduled.  Do not eat food after midnight the night before surgery.  No gum chewing, lozengers or hard candies.  You may however, drink CLEAR liquids up to 2 hours before you are scheduled to arrive for your surgery. Do not drink anything within 2 hours of your scheduled arrival time.  Clear liquids include: - water  - apple juice without pulp - gatorade (not RED, PURPLE, OR BLUE) - black coffee or tea (Do NOT add milk or creamers to the coffee or tea) Do NOT drink anything that is not on this list.   TAKE THESE MEDICATIONS THE MORNING OF SURGERY WITH A SIP OF WATER:  Adderral   Use inhalers on the day of surgery and bring to the hospital.    Follow recommendations from Cardiologist, Pulmonologist or PCP regarding stopping Aspirin.  One week prior to surgery: Stop Anti-inflammatories (NSAIDS) such as Advil, Aleve, Ibuprofen, Motrin, Naproxen, Naprosyn and Aspirin based products such as Excedrin, Goodys Powder, BC Powder. Stop ANY OVER THE COUNTER supplements until after surgery. You may however, continue to take Tylenol if needed for pain up until the day of surgery.  No Alcohol for 24 hours before or after surgery.  No Smoking including e-cigarettes for 24 hours prior to surgery.  No chewable tobacco products for at least 6 hours prior to surgery.  No nicotine patches on the day of surgery.  Do not use any "recreational" drugs for at least a week prior to your surgery.  Please be advised that the combination of cocaine and anesthesia may have negative outcomes, up to  and including death. If you test positive for cocaine, your surgery will be cancelled.  On the morning of surgery brush your teeth with toothpaste and water, you may rinse your mouth with mouthwash if you wish. Do not swallow any toothpaste or mouthwash.  Use CHG Soap or wipes as directed on instruction sheet.-provided for you  Do not wear jewelry, make-up, hairpins, clips or nail polish.  Do not wear lotions, powders, or perfumes.   Do not shave body from the neck down 48 hours prior to surgery just in case you cut yourself which could leave a site for infection.  Also, freshly shaved skin may become irritated if using the CHG soap.  Contact lenses, hearing aids and dentures may not be worn into surgery.  Do not bring valuables to the hospital. [facility] is not responsible for any missing/lost belongings or valuables.     Notify your doctor if there is any change in your medical condition (cold, fever, infection).  Wear comfortable clothing (specific to your surgery type) to the hospital.  After surgery, you can help prevent lung complications by doing breathing exercises.  Take deep breaths and cough every 1-2 hours. Your doctor may order a device called an Incentive Spirometer to help you take deep breaths. When coughing or sneezing, hold a pillow firmly against your incision with both hands. This is called "splinting." Doing this helps protect your incision. It also decreases belly discomfort.  If you are being admitted to the hospital overnight, leave your  suitcase in the car. After surgery it may be brought to your room.  If you are being discharged the day of surgery, you will not be allowed to drive home. You will need a responsible adult (18 years or older) to drive you home and stay with you that night.   If you are taking public transportation, you will need to have a responsible adult (18 years or older) with you. Please confirm with your physician that it is  acceptable to use public transportation.   Please call the [facility] at [phone] if you have any questions about these instructions.  Surgery Visitation Policy:  Patients undergoing a surgery or procedure may have one family member or support person with them as long as that person is not COVID-19 positive or experiencing its symptoms.  That person may remain in the waiting area during the procedure.  Inpatient Visitation:    Visiting hours are 7 a.m. to 8 p.m. Inpatients will be allowed two visitors daily. The visitors may change each day during the patient's stay. No visitors under the age of 72. Any visitor under the age of 57 must be accompanied by an adult. The visitor must pass COVID-19 screenings, use hand sanitizer when entering and exiting the patient's room and wear a mask at all times, including in the patient's room. Patients must also wear a mask when staff or their visitor are in the room. Masking is required regardless of vaccination status.

## 2020-09-19 NOTE — Pre-Procedure Instructions (Signed)
Pt daughter tracy lives in [place]. Her contact Number is [phone].

## 2020-09-20 ENCOUNTER — Inpatient Hospital Stay: Admission: RE | Admit: 2020-09-20 | Payer: Medicare HMO | Source: Ambulatory Visit

## 2020-09-21 ENCOUNTER — Other Ambulatory Visit: Payer: Self-pay

## 2020-09-21 ENCOUNTER — Encounter
Admission: RE | Admit: 2020-09-21 | Discharge: 2020-09-21 | Disposition: A | Discharge status: Home / Self Care | Payer: Medicare HMO | Source: Ambulatory Visit | Attending: Surgery | Admitting: Surgery

## 2020-09-21 ENCOUNTER — Encounter: Payer: Self-pay | Admitting: Urgent Care

## 2020-09-21 DIAGNOSIS — Z01818 Encounter for other preprocedural examination: Secondary | ICD-10-CM | POA: Insufficient documentation

## 2020-09-21 LAB — CBC
HCT: 44.9 % (ref 36.0–46.0)
Hemoglobin: 14.8 g/dL (ref 12.0–15.0)
MCH: 31.6 pg (ref 26.0–34.0)
MCHC: 33 g/dL (ref 30.0–36.0)
MCV: 95.7 fL (ref 80.0–100.0)
Platelets: 404 10*3/uL — ABNORMAL HIGH (ref 150–400)
RBC: 4.69 MIL/uL (ref 3.87–5.11)
RDW: 13 % (ref 11.5–15.5)
WBC: 8.4 10*3/uL (ref 4.0–10.5)
nRBC: 0 % (ref 0.0–0.2)

## 2020-09-21 LAB — BASIC METABOLIC PANEL
Anion gap: 9 (ref 5–15)
BUN: 14 mg/dL (ref 8–23)
CO2: 26 mmol/L (ref 22–32)
Calcium: 9.6 mg/dL (ref 8.9–10.3)
Chloride: 103 mmol/L (ref 98–111)
Creatinine, Ser: 0.54 mg/dL (ref 0.44–1.00)
GFR, Estimated: >60 mL/min (ref >60)
Glucose, Bld: 103 mg/dL — ABNORMAL HIGH (ref 70–99)
Potassium: 4.9 mmol/L (ref 3.5–5.1)
Sodium: 138 mmol/L (ref 135–145)

## 2020-09-29 ENCOUNTER — Encounter: Admission: RE | Payer: Self-pay | Source: Home / Self Care

## 2020-09-29 ENCOUNTER — Ambulatory Visit: Admission: RE | Admit: 2020-09-29 | Payer: Medicare HMO | Source: Home / Self Care | Admitting: Surgery

## 2020-09-29 SURGERY — REPAIR, HERNIA, INGUINAL, ROBOT-ASSISTED, LAPAROSCOPIC, USING MESH
Anesthesia: General | Laterality: Left

## 2020-12-16 ENCOUNTER — Other Ambulatory Visit: Payer: Self-pay

## 2020-12-16 ENCOUNTER — Ambulatory Visit
Admission: EM | Admit: 2020-12-16 | Discharge: 2020-12-16 | Disposition: A | Discharge status: Home / Self Care | Payer: Medicare HMO | Attending: Emergency Medicine | Admitting: Emergency Medicine

## 2020-12-16 DIAGNOSIS — U071 COVID-19: Secondary | ICD-10-CM

## 2020-12-16 MED ORDER — ALBUTEROL SULFATE HFA 108 (90 BASE) MCG/ACT IN AERS: 2.0000 | INHALATION_SPRAY | Freq: Four times a day (QID) | RESPIRATORY_TRACT | 0 refills | Status: AC | PRN

## 2020-12-16 MED ORDER — BENZONATATE 100 MG PO CAPS
200.0000 mg | ORAL_CAPSULE | Freq: Three times a day (TID) | ORAL | 0 refills | Status: DC
Start: 2020-12-16 — End: 2022-04-23

## 2020-12-16 MED ORDER — IPRATROPIUM BROMIDE 0.06 % NA SOLN: 2.0000 | Freq: Four times a day (QID) | NASAL | 12 refills | Status: DC

## 2020-12-16 MED ORDER — PROMETHAZINE-DM 6.25-15 MG/5ML PO SYRP: 5.0000 mL | ORAL_SOLUTION | Freq: Four times a day (QID) | ORAL | 0 refills | Status: DC | PRN

## 2020-12-16 NOTE — ED Triage Notes (Signed)
Pt here with C/O cough for 5 days, tested positive for Covid 3 hours ago.

## 2020-12-16 NOTE — Discharge Instructions (Signed)
Use the Atrovent nasal spray, 2 squirts in each nostril every 6 hours, as needed for runny nose and postnasal drip.  Use the Tessalon Perles every 8 hours during the day.  Take them with a small sip of water.  They may give you some numbness to the base of your tongue or a metallic taste in your mouth, this is normal.  Use the Promethazine DM cough syrup at bedtime for cough and congestion.  It will make you drowsy so do not take it during the day.  Use your albuterol inhaler, 1-2 puffs every 4-6 hours, as needed for shortness of breath and cough.  Return for reevaluation or see your primary care provider for any new or worsening symptoms.

## 2020-12-16 NOTE — ED Provider Notes (Signed)
[facility]    CSN: [id number] Arrival date & time: 12/16/20  1910      History   Chief Complaint Chief Complaint  Patient presents with   Covid Positive    HPI Ruth Morgan is a 65 y.o. female.   HPI  65 year old female here for evaluation of cough.  Patient reports that she been experiencing cough the last 5 days and took a test for COVID at her sister's behest today which was positive.  She is also had associated symptoms of runny nose and nasal congestion, productive cough for clear sputum and occasional streaks of blood, shortness of breath and wheezing, and ear pain.  She denies any fever, sore throat, or GI complaints.  Past Medical History:  Diagnosis Date   Anxiety state, unspecified 12/05/2012   Carpal tunnel syndrome    right - repaired. Left - not repaired   Colon polyp 2010   at [facility]   DDD (degenerative disc disease), lumbar    by MRI   Headache    History of shingles 2013   Hyperglycemia    Prediabetes 12/05/2012   Smoker 12/05/2012    Patient Active Problem List   Diagnosis Date Noted   Lesion of lip 05/04/2013   Right knee pain 05/04/2013   Healthcare maintenance 01/02/2013   Prediabetes 12/05/2012   Smoker 12/05/2012   Anxiety state, unspecified 12/05/2012   Headache(784.0) 12/05/2012    Past Surgical History:  Procedure Laterality Date   ABDOMINAL HYSTERECTOMY     APPENDECTOMY  1972   BREAST BIOPSY Left    needle bx /clip neg   BROW LIFT Bilateral 04/16/2017   Procedure: BLEPHAROPLASTY UPPER EYELID: W/EXCESS SKIN;  Surgeon: Karle Starch, MD;  Location: [place];  Service: Ophthalmology;  Laterality: Bilateral;   CARPAL TUNNEL RELEASE Right 2014   LASIK  2006   NASAL SINUS SURGERY  2004   TOTAL ABDOMINAL HYSTERECTOMY W/ BILATERAL SALPINGOOPHORECTOMY  1985   benign ovarian tumors    OB History   No obstetric history on file.      Home Medications    Prior to Admission medications   Medication Sig  Start Date End Date Taking? Authorizing Provider  amphetamine-dextroamphetamine (ADDERALL) 20 MG tablet Take 30 mg by mouth 2 (two) times daily.   Yes [provider]  benzonatate (TESSALON) 100 MG capsule Take 2 capsules (200 mg total) by mouth every 8 (eight) hours. 12/16/20  Yes Margarette Canada, NP  estradiol (ESTRACE) 0.1 MG/GM vaginal cream Place 1 Applicatorful vaginally daily as needed (dryness). 08/22/20  Yes [provider]  ipratropium (ATROVENT) 0.06 % nasal spray Place 2 sprays into both nostrils 4 (four) times daily. 12/16/20  Yes Margarette Canada, NP  lisinopril (ZESTRIL) 10 MG tablet Take 10 mg by mouth daily.   Yes [provider]  nicotine (NICODERM CQ - DOSED IN MG/24 HOURS) 14 mg/24hr patch Place 14 mg onto the skin daily as needed (nicotine dependence).   Yes [provider]  promethazine-dextromethorphan (PROMETHAZINE-DM) 6.25-15 MG/5ML syrup Take 5 mLs by mouth 4 (four) times daily as needed. 12/16/20  Yes Margarette Canada, NP  rosuvastatin (CRESTOR) 20 MG tablet Take 20 mg by mouth at bedtime. 09/06/20  Yes [provider]  albuterol (VENTOLIN HFA) 108 (90 Base) MCG/ACT inhaler Inhale 2 puffs into the lungs every 6 (six) hours as needed for wheezing or shortness of breath. 12/16/20   Margarette Canada, NP  bismuth subsalicylate (PEPTO BISMOL) 262 MG/15ML suspension Take 30  mLs by mouth every 6 (six) hours as needed for indigestion.    [provider]    Family History Family History  Problem Relation Age of Onset   Diabetes Sister    Diabetes Brother    Cancer Father        stomach   CAD Father 26       MI   Kidney failure Father        ESRD on HD   Hyperlipidemia Father    Cancer Paternal Grandfather        throat   Cancer Paternal Grandmother        ovarian   CAD Sister        MI   CAD Brother        MI   Hypertension Mother    Breast cancer Niece 56    Social History Social History   Tobacco Use   Smoking status:  Some Days    Packs/day: 0.75    Years: 46.00    Pack years: 34.50    Types: Cigarettes   Smokeless tobacco: Never   Tobacco comments:    .5 ppd currently  Vaping Use   Vaping Use: Never used  Substance Use Topics   Alcohol use: Yes    Comment: rare   Drug use: No     Allergies   Patient has no known allergies.   Review of Systems Review of Systems  Constitutional:  Negative for activity change, appetite change and fever.  HENT:  Positive for congestion, ear pain and rhinorrhea. Negative for sore throat.   Respiratory:  Positive for cough, shortness of breath and wheezing.   Gastrointestinal:  Negative for diarrhea, nausea and vomiting.  Skin:  Negative for rash.  Hematological: Negative.   Psychiatric/Behavioral: Negative.      Physical Exam Triage Vital Signs ED Triage Vitals  Enc Vitals Group     BP 12/16/20 1916 (!) 156/90     Pulse Rate 12/16/20 1916 99     Resp 12/16/20 1916 18     Temp 12/16/20 1916 98.6 F (37 C)     Temp src --      SpO2 12/16/20 1916 99 %     Weight 12/16/20 1915 110 lb (49.9 kg)     Height 12/16/20 1915 5\' 1"  (1.549 m)     Head Circumference --      Peak Flow --      Pain Score 12/16/20 1915 0     Pain Loc --      Pain Edu? --      Excl. in [place]? --    No data found.  Updated Vital Signs BP (!) 156/90    Pulse 99    Temp 98.6 F (37 C)    Resp 18    Ht 5\' 1"  (1.549 m)    Wt 110 lb (49.9 kg)    SpO2 99%    BMI 20.78 kg/m   Visual Acuity Right Eye Distance:   Left Eye Distance:   Bilateral Distance:    Right Eye Near:   Left Eye Near:    Bilateral Near:     Physical Exam Vitals and nursing note reviewed.  Constitutional:      Appearance: Normal appearance.  HENT:     Head: Normocephalic and atraumatic.     Right Ear: Tympanic membrane, ear canal and external ear normal. There is no impacted cerumen.     Left Ear: Tympanic membrane, ear canal and  external ear normal. There is no impacted cerumen.     Nose: Congestion  and rhinorrhea present.     Mouth/Throat:     Mouth: Mucous membranes are moist.     Pharynx: Oropharynx is clear. No posterior oropharyngeal erythema.  Cardiovascular:     Rate and Rhythm: Normal rate and regular rhythm.     Pulses: Normal pulses.     Heart sounds: Normal heart sounds. No murmur heard.   No gallop.  Pulmonary:     Effort: Pulmonary effort is normal.     Breath sounds: Normal breath sounds. No wheezing, rhonchi or rales.  Musculoskeletal:     Cervical back: Normal range of motion and neck supple.  Lymphadenopathy:     Cervical: No cervical adenopathy.  Skin:    General: Skin is warm and dry.     Capillary Refill: Capillary refill takes less than 2 seconds.     Findings: No erythema or rash.  Neurological:     General: No focal deficit present.     Mental Status: She is alert and oriented to person, place, and time.  Psychiatric:        Mood and Affect: Mood normal.        Behavior: Behavior normal.        Thought Content: Thought content normal.        Judgment: Judgment normal.     UC Treatments / Results  Labs (all labs ordered are listed, but only abnormal results are displayed) Labs Reviewed - No data to display  EKG   Radiology No results found.  Procedures Procedures (including critical care time)  Medications Ordered in UC Medications - No data to display  Initial Impression / Assessment and Plan / UC Course  I have reviewed the triage vital signs and the nursing notes.  Pertinent labs & imaging results that were available during my care of the patient were reviewed by me and considered in my medical decision making (see chart for details).  Patient is a nontoxic-appearing 65 year old female here for evaluation of respiratory symptoms after testing COVID-positive today as outlined in the HPI above.  Her symptoms have been present for the past 5 days.  She is outside the window for antiviral therapy as I explained to her.  On physical exam  she has pearly-gray tympanic membranes bilaterally with normal light reflex and clear external auditory canals.  Nasal mucosa is erythematous and edematous with clear nasal discharge in both nares.  Oropharyngeal exam is unremarkable.  No cervical lymphadenopathy appreciated exam.  Cardiopulmonary exam reveals clear lung sounds in all fields.  As stated above, patient is outside the window for antiviral therapy.  We will give Atrovent nasal spray to help with the nasal congestion, Tessalon Perles and Promethazine DM cough syrup to help with cough and congestion and albuterol inhaler to help with wheezing and shortness of breath.   Final Clinical Impressions(s) / UC Diagnoses   Final diagnoses:  [id number]     Discharge Instructions      Use the Atrovent nasal spray, 2 squirts in each nostril every 6 hours, as needed for runny nose and postnasal drip.  Use the Tessalon Perles every 8 hours during the day.  Take them with a small sip of water.  They may give you some numbness to the base of your tongue or a metallic taste in your mouth, this is normal.  Use the Promethazine DM cough syrup at bedtime for cough and congestion.  It will  make you drowsy so do not take it during the day.  Use your albuterol inhaler, 1-2 puffs every 4-6 hours, as needed for shortness of breath and cough.  Return for reevaluation or see your primary care provider for any new or worsening symptoms.      ED Prescriptions     Medication Sig Dispense Auth. Provider   albuterol (VENTOLIN HFA) 108 (90 Base) MCG/ACT inhaler Inhale 2 puffs into the lungs every 6 (six) hours as needed for wheezing or shortness of breath. 18 g Margarette Canada, NP   benzonatate (TESSALON) 100 MG capsule Take 2 capsules (200 mg total) by mouth every 8 (eight) hours. 21 capsule Margarette Canada, NP   ipratropium (ATROVENT) 0.06 % nasal spray Place 2 sprays into both nostrils 4 (four) times daily. 15 mL Margarette Canada, NP    promethazine-dextromethorphan (PROMETHAZINE-DM) 6.25-15 MG/5ML syrup Take 5 mLs by mouth 4 (four) times daily as needed. 118 mL Margarette Canada, NP      PDMP not reviewed this encounter.   Margarette Canada, NP 12/16/20 [phone]

## 2021-02-08 ENCOUNTER — Other Ambulatory Visit: Payer: Self-pay

## 2021-02-08 DIAGNOSIS — F17210 Nicotine dependence, cigarettes, uncomplicated: Secondary | ICD-10-CM

## 2021-02-08 DIAGNOSIS — Z87891 Personal history of nicotine dependence: Secondary | ICD-10-CM

## 2021-02-13 ENCOUNTER — Other Ambulatory Visit: Payer: Self-pay | Admitting: Student

## 2021-02-13 ENCOUNTER — Other Ambulatory Visit: Payer: Self-pay | Admitting: Family Medicine

## 2021-02-13 DIAGNOSIS — R0602 Shortness of breath: Secondary | ICD-10-CM

## 2021-02-13 DIAGNOSIS — Z1231 Encounter for screening mammogram for malignant neoplasm of breast: Secondary | ICD-10-CM

## 2021-02-13 DIAGNOSIS — Z720 Tobacco use: Secondary | ICD-10-CM

## 2021-02-13 DIAGNOSIS — I208 Other forms of angina pectoris: Secondary | ICD-10-CM

## 2021-02-13 DIAGNOSIS — Z8249 Family history of ischemic heart disease and other diseases of the circulatory system: Secondary | ICD-10-CM

## 2021-02-23 ENCOUNTER — Ambulatory Visit: Admission: RE | Admit: 2021-02-23 | Payer: Medicare HMO | Source: Ambulatory Visit

## 2021-02-23 ENCOUNTER — Ambulatory Visit
Admission: RE | Admit: 2021-02-23 | Discharge: 2021-02-23 | Disposition: A | Discharge status: Home / Self Care | Payer: Medicare Other | Source: Ambulatory Visit | Attending: Acute Care | Admitting: Acute Care

## 2021-02-23 ENCOUNTER — Other Ambulatory Visit: Payer: Self-pay

## 2021-02-23 DIAGNOSIS — F17210 Nicotine dependence, cigarettes, uncomplicated: Secondary | ICD-10-CM | POA: Diagnosis present

## 2021-02-23 DIAGNOSIS — Z87891 Personal history of nicotine dependence: Secondary | ICD-10-CM | POA: Diagnosis present

## 2021-02-27 ENCOUNTER — Other Ambulatory Visit: Payer: Self-pay

## 2021-02-27 DIAGNOSIS — Z87891 Personal history of nicotine dependence: Secondary | ICD-10-CM

## 2021-02-27 DIAGNOSIS — F17210 Nicotine dependence, cigarettes, uncomplicated: Secondary | ICD-10-CM

## 2021-03-01 DIAGNOSIS — I5189 Other ill-defined heart diseases: Secondary | ICD-10-CM

## 2021-03-01 HISTORY — DX: Other ill-defined heart diseases: I51.89

## 2021-03-23 ENCOUNTER — Other Ambulatory Visit: Payer: Self-pay

## 2021-03-23 ENCOUNTER — Ambulatory Visit
Admission: RE | Admit: 2021-03-23 | Discharge: 2021-03-23 | Disposition: A | Discharge status: Home / Self Care | Payer: Medicare Other | Source: Ambulatory Visit | Attending: Student | Admitting: Student

## 2021-03-23 DIAGNOSIS — R0602 Shortness of breath: Secondary | ICD-10-CM | POA: Insufficient documentation

## 2021-03-23 DIAGNOSIS — I2510 Atherosclerotic heart disease of native coronary artery without angina pectoris: Secondary | ICD-10-CM

## 2021-03-23 DIAGNOSIS — Z8249 Family history of ischemic heart disease and other diseases of the circulatory system: Secondary | ICD-10-CM | POA: Insufficient documentation

## 2021-03-23 DIAGNOSIS — Z720 Tobacco use: Secondary | ICD-10-CM | POA: Insufficient documentation

## 2021-03-23 DIAGNOSIS — I208 Other forms of angina pectoris: Secondary | ICD-10-CM | POA: Insufficient documentation

## 2021-03-23 HISTORY — DX: Atherosclerotic heart disease of native coronary artery without angina pectoris: I25.10

## 2021-04-10 ENCOUNTER — Inpatient Hospital Stay: Admission: RE | Admit: 2021-04-10 | Payer: Medicare Other | Source: Ambulatory Visit

## 2021-05-23 ENCOUNTER — Ambulatory Visit
Admission: RE | Admit: 2021-05-23 | Discharge: 2021-05-23 | Disposition: A | Discharge status: Home / Self Care | Payer: Medicare Other | Source: Ambulatory Visit | Attending: Family Medicine | Admitting: Family Medicine

## 2021-05-23 ENCOUNTER — Ambulatory Visit: Payer: Medicare Other

## 2021-05-23 DIAGNOSIS — Z1231 Encounter for screening mammogram for malignant neoplasm of breast: Secondary | ICD-10-CM | POA: Diagnosis present

## 2021-06-01 ENCOUNTER — Encounter: Payer: Self-pay | Admitting: Gastroenterology

## 2021-06-02 ENCOUNTER — Ambulatory Visit: Payer: Medicare Other | Admitting: Anesthesiology

## 2021-06-02 ENCOUNTER — Ambulatory Visit
Admission: RE | Admit: 2021-06-02 | Discharge: 2021-06-02 | Disposition: A | Discharge status: Home / Self Care | Payer: Medicare Other | Attending: Gastroenterology | Admitting: Gastroenterology

## 2021-06-02 ENCOUNTER — Encounter
Admission: RE | Disposition: A | Discharge status: Home / Self Care | Payer: Self-pay | Source: Home / Self Care | Attending: Gastroenterology

## 2021-06-02 ENCOUNTER — Encounter: Payer: Self-pay | Admitting: Gastroenterology

## 2021-06-02 DIAGNOSIS — F419 Anxiety disorder, unspecified: Secondary | ICD-10-CM | POA: Insufficient documentation

## 2021-06-02 DIAGNOSIS — J449 Chronic obstructive pulmonary disease, unspecified: Secondary | ICD-10-CM | POA: Diagnosis not present

## 2021-06-02 DIAGNOSIS — K621 Rectal polyp: Secondary | ICD-10-CM | POA: Diagnosis not present

## 2021-06-02 DIAGNOSIS — D120 Benign neoplasm of cecum: Secondary | ICD-10-CM | POA: Diagnosis not present

## 2021-06-02 DIAGNOSIS — D124 Benign neoplasm of descending colon: Secondary | ICD-10-CM | POA: Insufficient documentation

## 2021-06-02 DIAGNOSIS — R7303 Prediabetes: Secondary | ICD-10-CM | POA: Insufficient documentation

## 2021-06-02 DIAGNOSIS — M1990 Unspecified osteoarthritis, unspecified site: Secondary | ICD-10-CM | POA: Insufficient documentation

## 2021-06-02 DIAGNOSIS — Z87891 Personal history of nicotine dependence: Secondary | ICD-10-CM | POA: Diagnosis not present

## 2021-06-02 DIAGNOSIS — Z86010 Personal history of colonic polyps: Secondary | ICD-10-CM | POA: Insufficient documentation

## 2021-06-02 DIAGNOSIS — K640 First degree hemorrhoids: Secondary | ICD-10-CM | POA: Diagnosis not present

## 2021-06-02 DIAGNOSIS — D123 Benign neoplasm of transverse colon: Secondary | ICD-10-CM | POA: Diagnosis not present

## 2021-06-02 DIAGNOSIS — Z79899 Other long term (current) drug therapy: Secondary | ICD-10-CM | POA: Insufficient documentation

## 2021-06-02 DIAGNOSIS — Z1211 Encounter for screening for malignant neoplasm of colon: Secondary | ICD-10-CM | POA: Diagnosis present

## 2021-06-02 HISTORY — DX: Chronic obstructive pulmonary disease, unspecified: J44.9

## 2021-06-02 HISTORY — PX: COLONOSCOPY: SHX5424

## 2021-06-02 SURGERY — COLONOSCOPY
Anesthesia: General

## 2021-06-02 MED ORDER — PROPOFOL 10 MG/ML IV BOLUS
INTRAVENOUS | Status: DC | PRN
  Administered 2021-06-02: 50 mg via INTRAVENOUS

## 2021-06-02 MED ORDER — PROPOFOL 10 MG/ML IV BOLUS
INTRAVENOUS | Status: AC
  Filled 2021-06-02: qty 20

## 2021-06-02 MED ORDER — LIDOCAINE HCL (CARDIAC) PF 100 MG/5ML IV SOSY
PREFILLED_SYRINGE | INTRAVENOUS | Status: DC | PRN
  Administered 2021-06-02: 50 mg via INTRAVENOUS

## 2021-06-02 MED ORDER — PROPOFOL 500 MG/50ML IV EMUL
INTRAVENOUS | Status: DC | PRN
  Administered 2021-06-02: 125 ug/kg/min via INTRAVENOUS

## 2021-06-02 MED ORDER — SODIUM CHLORIDE 0.9 % IV SOLN
INTRAVENOUS | Status: DC
  Administered 2021-06-02: 20 mL/h via INTRAVENOUS

## 2021-06-02 NOTE — Anesthesia Preprocedure Evaluation (Addendum)
Anesthesia Evaluation  Patient identified by MRN, date of birth, ID band Patient awake    Reviewed: Allergy & Precautions, NPO status , Patient's Chart, lab work & pertinent test results  Airway Mallampati: III  TM Distance: >3 FB Neck ROM: full    Dental no notable dental hx.    Pulmonary COPD, Current Smoker and Patient abstained from smoking.,    Pulmonary exam normal        Cardiovascular Exercise Tolerance: Good Normal cardiovascular exam     Neuro/Psych PSYCHIATRIC DISORDERS Anxiety negative neurological ROS     GI/Hepatic negative GI ROS, Neg liver ROS,   Endo/Other  prediabetes  Renal/GU negative Renal ROS  negative genitourinary   Musculoskeletal  (+) Arthritis ,   Abdominal Normal abdominal exam  (+)   Peds  Hematology negative hematology ROS (+)   Anesthesia Other Findings Past Medical History: 12/05/2012: Anxiety state, unspecified No date: Carpal tunnel syndrome     Comment:  right - repaired. Left - not repaired 2010: Colon polyp     Comment:  at [facility] No date: COPD (chronic obstructive pulmonary disease) ([place]) No date: DDD (degenerative disc disease), lumbar     Comment:  by MRI No date: Headache 2013: History of shingles No date: Hyperglycemia 12/05/2012: Prediabetes 12/05/2012: Smoker  Past Surgical History: No date: ABDOMINAL HYSTERECTOMY 1972: APPENDECTOMY No date: BREAST BIOPSY; Left     Comment:  needle bx /clip neg 04/16/2017: BROW LIFT; Bilateral     Comment:  Procedure: BLEPHAROPLASTY UPPER EYELID: W/EXCESS SKIN;                Surgeon: Karle Starch, MD;  Location: [place];  Service: Ophthalmology;  Laterality: Bilateral; 2014: CARPAL TUNNEL RELEASE; Right 2006: LASIK 2004: NASAL SINUS SURGERY 1985: TOTAL ABDOMINAL HYSTERECTOMY W/ BILATERAL SALPINGOOPHORECTOMY     Comment:  benign ovarian tumors     Reproductive/Obstetrics negative OB ROS                             Anesthesia Physical Anesthesia Plan  ASA: 2  Anesthesia Plan: General   Post-op Pain Management:    Induction: Intravenous  PONV Risk Score and Plan: Propofol infusion and TIVA  Airway Management Planned: Natural Airway and Nasal Cannula  Additional Equipment:   Intra-op Plan:   Post-operative Plan:   Informed Consent: I have reviewed the patients History and Physical, chart, labs and discussed the procedure including the risks, benefits and alternatives for the proposed anesthesia with the patient or authorized representative who has indicated his/her understanding and acceptance.     Dental Advisory Given  Plan Discussed with: Anesthesiologist, CRNA and Surgeon  Anesthesia Plan Comments:        Anesthesia Quick Evaluation

## 2021-06-02 NOTE — Anesthesia Postprocedure Evaluation (Signed)
Anesthesia Post Note  Patient: Ruth Morgan  Procedure(s) Performed: COLONOSCOPY  Patient location during evaluation: PACU Anesthesia Type: General Level of consciousness: awake and oriented Pain management: pain level controlled Vital Signs Assessment: post-procedure vital signs reviewed and stable Respiratory status: spontaneous breathing and respiratory function stable Cardiovascular status: stable Anesthetic complications: no   No notable events documented.   Last Vitals:  Vitals:   06/02/21 1332 06/02/21 1342  BP: 111/68   Pulse:  86  Resp:  15  Temp: (!) 36.2 C   SpO2:  100%    Last Pain:  Vitals:   06/02/21 1342  TempSrc:   PainSc: 0-No pain                 VAN STAVEREN,Alvin Diffee

## 2021-06-02 NOTE — Interval H&P Note (Signed)
History and Physical Interval Note: Preprocedure H&P from 06/02/21  was reviewed and there was no interval change after seeing and examining the patient.  Written consent was obtained from the patient after discussion of risks, benefits, and alternatives. Patient has consented to proceed with Colonoscopy with possible intervention   06/02/2021 12:07 PM  Ruth Morgan  has presented today for surgery, with the diagnosis of History of adenomatous polyp of colon (Z86.010).  The various methods of treatment have been discussed with the patient and family. After consideration of risks, benefits and other options for treatment, the patient has consented to  Procedure(s): COLONOSCOPY (N/A) as a surgical intervention.  The patient's history has been reviewed, patient examined, no change in status, stable for surgery.  I have reviewed the patient's chart and labs.  Questions were answered to the patient's satisfaction.     Annamaria Helling

## 2021-06-02 NOTE — Transfer of Care (Signed)
Immediate Anesthesia Transfer of Care Note  Patient: Ruth Morgan  Procedure(s) Performed: COLONOSCOPY  Patient Location: PACU and Endoscopy Unit  Anesthesia Type:General  Level of Consciousness: drowsy and patient cooperative  Airway & Oxygen Therapy: Patient Spontanous Breathing  Post-op Assessment: Report given to RN and Post -op Vital signs reviewed and stable  Post vital signs: Reviewed and stable  Last Vitals:  Vitals Value Taken Time  BP 111/67   Temp    Pulse 70 06/02/21 1331  Resp 12 06/02/21 1331  SpO2 100 % 06/02/21 1331  Vitals shown include unvalidated device data.  Last Pain:  Vitals:   06/02/21 1107  TempSrc: Temporal  PainSc: 4          Complications: No notable events documented.

## 2021-06-02 NOTE — H&P (Signed)
Pre-Procedure H&P   Patient ID: Ruth Morgan is a 66 y.o. female.  Gastroenterology Provider: Annamaria Helling, DO  Referring Provider: Dr. Tamala Julian PCP: Luther Hearing, MD  Date: 06/02/2021  HPI [name] is a 66 y.o. female who presents today for Colonoscopy for Surveillance-history of colon polyps. Patient last underwent colonoscopy at [facility] in April 2016 with 1 tubular adenomatous polyp.  Sigmoid diverticulosis was also noted.  No family history of colon cancer or colon polyps.  Bowel movements are very regular without constipation diarrhea melena or hematochezia.  30-pack-year history of tobacco use quit in 2022.  Also using BC powders, aspirin and Suboxone.  Creatinine 0.6  Past Medical History:  Diagnosis Date   Anxiety state, unspecified 12/05/2012   Carpal tunnel syndrome    right - repaired. Left - not repaired   Colon polyp 2010   at [facility]   COPD (chronic obstructive pulmonary disease) (HCC)    DDD (degenerative disc disease), lumbar    by MRI   Headache    History of shingles 2013   Hyperglycemia    Prediabetes 12/05/2012   Smoker 12/05/2012    Past Surgical History:  Procedure Laterality Date   ABDOMINAL HYSTERECTOMY     APPENDECTOMY  1972   BREAST BIOPSY Left    needle bx /clip neg   BROW LIFT Bilateral 04/16/2017   Procedure: BLEPHAROPLASTY UPPER EYELID: W/EXCESS SKIN;  Surgeon: Karle Starch, MD;  Location: [place];  Service: Ophthalmology;  Laterality: Bilateral;   CARPAL TUNNEL RELEASE Right 2014   LASIK  2006   NASAL SINUS SURGERY  2004   TOTAL ABDOMINAL HYSTERECTOMY W/ BILATERAL SALPINGOOPHORECTOMY  1985   benign ovarian tumors    Family History No h/o GI disease or malignancy  Review of Systems  Constitutional:  Negative for activity change, appetite change, chills, diaphoresis, fatigue, fever and unexpected weight change.  HENT:  Negative for trouble swallowing and voice change.   Respiratory:   Negative for shortness of breath and wheezing.   Cardiovascular:  Negative for chest pain, palpitations and leg swelling.  Gastrointestinal:  Negative for abdominal distention, abdominal pain, anal bleeding, blood in stool, constipation, diarrhea, nausea, rectal pain and vomiting.  Musculoskeletal:  Negative for arthralgias and myalgias.  Skin:  Negative for color change and pallor.  Neurological:  Negative for dizziness, syncope and weakness.  Psychiatric/Behavioral:  Negative for confusion.   All other systems reviewed and are negative.   Medications No current facility-administered medications on file prior to encounter.   Current Outpatient Medications on File Prior to Encounter  Medication Sig Dispense Refill   albuterol (VENTOLIN HFA) 108 (90 Base) MCG/ACT inhaler Inhale 2 puffs into the lungs every 6 (six) hours as needed for wheezing or shortness of breath. 18 g 0   amphetamine-dextroamphetamine (ADDERALL) 20 MG tablet Take 30 mg by mouth 2 (two) times daily.     benzonatate (TESSALON) 100 MG capsule Take 2 capsules (200 mg total) by mouth every 8 (eight) hours. 21 capsule 0   bismuth subsalicylate (PEPTO BISMOL) 262 MG/15ML suspension Take 30 mLs by mouth every 6 (six) hours as needed for indigestion.     estradiol (ESTRACE) 0.1 MG/GM vaginal cream Place 1 Applicatorful vaginally daily as needed (dryness).     ipratropium (ATROVENT) 0.06 % nasal spray Place 2 sprays into both nostrils 4 (four) times daily. 15 mL 12   lisinopril (ZESTRIL) 10 MG tablet Take 10 mg by mouth daily.  nicotine (NICODERM CQ - DOSED IN MG/24 HOURS) 14 mg/24hr patch Place 14 mg onto the skin daily as needed (nicotine dependence).     promethazine-dextromethorphan (PROMETHAZINE-DM) 6.25-15 MG/5ML syrup Take 5 mLs by mouth 4 (four) times daily as needed. 118 mL 0   rosuvastatin (CRESTOR) 20 MG tablet Take 20 mg by mouth at bedtime.      Pertinent medications related to GI and procedure were reviewed by me  with the patient prior to the procedure   Current Facility-Administered Medications:    0.9 %  sodium chloride infusion, , Intravenous, Continuous, Annamaria Helling, DO, Last Rate: 20 mL/hr at 06/02/21 1123, 20 mL/hr at 06/02/21 1123      No Known Allergies Allergies were reviewed by me prior to the procedure  Objective   Body mass index is 19.57 kg/m. Vitals:   06/02/21 1107  BP: 132/66  Pulse: 77  Resp: 20  Temp: (!) 97.4 F (36.3 C)  TempSrc: Temporal  SpO2: 100%  Weight: 48.5 kg  Height: '5\' 2"'$  (1.575 m)     Physical Exam Vitals and nursing note reviewed.  Constitutional:      General: She is not in acute distress.    Appearance: Normal appearance. She is not ill-appearing, toxic-appearing or diaphoretic.  HENT:     Head: Normocephalic and atraumatic.     Nose: Nose normal.     Mouth/Throat:     Mouth: Mucous membranes are moist.     Pharynx: Oropharynx is clear.  Eyes:     General: No scleral icterus.    Extraocular Movements: Extraocular movements intact.  Cardiovascular:     Rate and Rhythm: Normal rate and regular rhythm.     Heart sounds: Normal heart sounds. No murmur heard.   No friction rub. No gallop.  Pulmonary:     Effort: Pulmonary effort is normal. No respiratory distress.     Breath sounds: Normal breath sounds. No wheezing, rhonchi or rales.  Abdominal:     General: Bowel sounds are normal. There is no distension.     Palpations: Abdomen is soft.     Tenderness: There is no abdominal tenderness. There is no guarding or rebound.  Musculoskeletal:     Cervical back: Neck supple.     Right lower leg: No edema.     Left lower leg: No edema.  Skin:    General: Skin is warm and dry.     Coloration: Skin is not jaundiced or pale.  Neurological:     General: No focal deficit present.     Mental Status: She is alert and oriented to person, place, and time. Mental status is at baseline.  Psychiatric:        Mood and Affect: Mood normal.         Behavior: Behavior normal.        Thought Content: Thought content normal.        Judgment: Judgment normal.     Assessment:  [name] is a 66 y.o. female  who presents today for Colonoscopy for Surveillance-history of colon polyps.  Plan:  Colonoscopy with possible intervention today  Colonoscopy with possible biopsy, control of bleeding, polypectomy, and interventions as necessary has been discussed with the patient/patient representative. Informed consent was obtained from the patient/patient representative after explaining the indication, nature, and risks of the procedure including but not limited to death, bleeding, perforation, missed neoplasm/lesions, cardiorespiratory compromise, and reaction to medications. Opportunity for questions was given and appropriate answers were  provided. Patient/patient representative has verbalized understanding is amenable to undergoing the procedure.   Annamaria Helling, DO  [facility] Gastroenterology  Portions of the record may have been created with voice recognition software. Occasional wrong-word or 'sound-a-like' substitutions may have occurred due to the inherent limitations of voice recognition software.  Read the chart carefully and recognize, using context, where substitutions may have occurred.

## 2021-06-02 NOTE — Op Note (Signed)
[facility] Gastroenterology Patient Name: Ruth Morgan Procedure Date: 06/02/2021 12:50 PM MRN: [id number] Account #: [id number] Date of Birth: 10/15/1955 Admit Type: Outpatient Age: 66 Room: [facility] ENDO ROOM 1 Gender: Female Note Status: Finalized Instrument Name: Peds Colonoscope [id number] Procedure:             Colonoscopy Indications:           High risk colon cancer surveillance: Personal history                         of colonic polyps Providers:             Rueben Bash, DO Referring MD:          Olevia Bowens. Tamala Julian (Referring MD) Medicines:             Monitored Anesthesia Care Complications:         No immediate complications. Estimated blood loss:                         Minimal. Procedure:             Pre-Anesthesia Assessment:                        - Prior to the procedure, a History and Physical was                         performed, and patient medications and allergies were                         reviewed. The patient is competent. The risks and                         benefits of the procedure and the sedation options and                         risks were discussed with the patient. All questions                         were answered and informed consent was obtained.                         Patient identification and proposed procedure were                         verified by the physician, the nurse, the anesthetist                         and the technician in the endoscopy suite. Mental                         Status Examination: alert and oriented. Airway                         Examination: normal oropharyngeal airway and neck                         mobility. Respiratory Examination: clear to  auscultation. CV Examination: RRR, no murmurs, no S3                         or S4. Prophylactic Antibiotics: The patient does not                         require prophylactic antibiotics. Prior                          Anticoagulants: The patient has taken no previous                         anticoagulant or antiplatelet agents. ASA Grade                         Assessment: II - A patient with mild systemic disease.                         After reviewing the risks and benefits, the patient                         was deemed in satisfactory condition to undergo the                         procedure. The anesthesia plan was to use monitored                         anesthesia care (MAC). Immediately prior to                         administration of medications, the patient was                         re-assessed for adequacy to receive sedatives. The                         heart rate, respiratory rate, oxygen saturations,                         blood pressure, adequacy of pulmonary ventilation, and                         response to care were monitored throughout the                         procedure. The physical status of the patient was                         re-assessed after the procedure.                        After obtaining informed consent, the colonoscope was                         passed under direct vision. Throughout the procedure,                         the patient's blood pressure, pulse, and oxygen  saturations were monitored continuously. The                         Colonoscope was introduced through the anus and                         advanced to the the cecum, identified by appendiceal                         orifice and ileocecal valve. The colonoscopy was                         performed without difficulty. The patient tolerated                         the procedure well. The quality of the bowel                         preparation was evaluated using the BBPS [facility] Bowel                         Preparation Scale) with scores of: Right Colon = 2                         (minor amount of residual staining, small fragments of                          stool and/or opaque liquid, but mucosa seen well),                         Transverse Colon = 3 (entire mucosa seen well with no                         residual staining, small fragments of stool or opaque                         liquid) and Left Colon = 2 (minor amount of residual                         staining, small fragments of stool and/or opaque                         liquid, but mucosa seen well). The total BBPS score                         equals 7. The quality of the bowel preparation was                         good. The ileocecal valve, appendiceal orifice, and                         rectum were photographed. Findings:      The perianal and digital rectal examinations were normal. Pertinent       negatives include normal sphincter tone.      Non-bleeding internal hemorrhoids were found during retroflexion. The       hemorrhoids were Grade I (internal hemorrhoids that do not  prolapse).       Estimated blood loss: none.      Anal papilla(e) were hypertrophied. Estimated blood loss: none.      Five sessile polyps were found in the descending colon (1), transverse       colon (2) and cecum (2). The polyps were 3 to 4 mm in size. Estimated       blood loss was minimal.      Two sessile polyps were found in the rectum and descending colon. The       polyps were 1 to 2 mm in size. These polyps were removed with a jumbo       cold forceps. Resection and retrieval were complete. Estimated blood       loss was minimal.      The exam was otherwise without abnormality on direct and retroflexion       views. Impression:            - Non-bleeding internal hemorrhoids.                        - Anal papilla(e) were hypertrophied.                        - Five 3 to 4 mm polyps in the descending colon, in                         the transverse colon and in the cecum.                        - Two 1 to 2 mm polyps in the rectum and in the                         descending colon, removed  with a jumbo cold forceps.                         Resected and retrieved.                        - The examination was otherwise normal on direct and                         retroflexion views. Recommendation:        - Discharge patient to home.                        - Resume previous diet.                        - Continue present medications.                        - No aspirin, ibuprofen, naproxen, or other                         non-steroidal anti-inflammatory drugs for 5 days after                         polyp removal.                        - Await pathology results.                        -  Repeat colonoscopy for surveillance based on                         pathology results.                        - Return to referring physician as previously                         scheduled.                        - The findings and recommendations were discussed with                         the patient. Procedure Code(s):     --- Professional ---                        [phone], Colonoscopy, flexible; with biopsy, single or                         multiple Diagnosis Code(s):     --- Professional ---                        Z86.010, Personal history of colonic polyps                        K62.89, Other specified diseases of anus and rectum                        K62.1, Rectal polyp                        K63.5, Polyp of colon                        K64.0, First degree hemorrhoids CPT copyright 2019 American Medical Association. All rights reserved. The codes documented in this report are preliminary and upon coder review may  be revised to meet current compliance requirements. Attending Participation:      I personally performed the entire procedure. Volney American, DO Annamaria Helling DO, DO 06/02/2021 1:33:11 PM This report has been signed electronically. Number of Addenda: 0 Note Initiated On: 06/02/2021 12:50 PM Scope Withdrawal Time: 0 hours 22 minutes 11 seconds  Total Procedure  Duration: 0 hours 30 minutes 44 seconds  Estimated Blood Loss:  Estimated blood loss was minimal.      [facility]

## 2021-06-02 NOTE — Anesthesia Procedure Notes (Signed)
Procedure Name: MAC Date/Time: 06/02/2021 12:58 PM Performed by: Jerrye Noble, CRNA Pre-anesthesia Checklist: Patient identified, Emergency Drugs available, Suction available and Patient being monitored Patient Re-evaluated:Patient Re-evaluated prior to induction Oxygen Delivery Method: Nasal cannula

## 2021-06-03 ENCOUNTER — Encounter: Payer: Self-pay | Admitting: Gastroenterology

## 2021-06-05 ENCOUNTER — Other Ambulatory Visit: Payer: Self-pay | Admitting: Family Medicine

## 2021-06-05 DIAGNOSIS — N6489 Other specified disorders of breast: Secondary | ICD-10-CM

## 2021-06-05 DIAGNOSIS — R928 Other abnormal and inconclusive findings on diagnostic imaging of breast: Secondary | ICD-10-CM

## 2021-06-05 LAB — SURGICAL PATHOLOGY

## 2021-06-07 ENCOUNTER — Ambulatory Visit
Admission: RE | Admit: 2021-06-07 | Discharge: 2021-06-07 | Disposition: A | Discharge status: Home / Self Care | Payer: Medicare Other | Source: Ambulatory Visit | Attending: Family Medicine | Admitting: Family Medicine

## 2021-06-07 DIAGNOSIS — N6489 Other specified disorders of breast: Secondary | ICD-10-CM | POA: Diagnosis present

## 2021-06-07 DIAGNOSIS — R928 Other abnormal and inconclusive findings on diagnostic imaging of breast: Secondary | ICD-10-CM

## 2022-02-27 ENCOUNTER — Ambulatory Visit: Admission: RE | Admit: 2022-02-27 | Payer: Medicare Other | Source: Ambulatory Visit

## 2022-03-09 ENCOUNTER — Other Ambulatory Visit: Payer: Self-pay | Admitting: *Deleted

## 2022-03-09 DIAGNOSIS — F17210 Nicotine dependence, cigarettes, uncomplicated: Secondary | ICD-10-CM

## 2022-03-09 DIAGNOSIS — Z87891 Personal history of nicotine dependence: Secondary | ICD-10-CM

## 2022-03-09 DIAGNOSIS — Z122 Encounter for screening for malignant neoplasm of respiratory organs: Secondary | ICD-10-CM

## 2022-03-27 ENCOUNTER — Ambulatory Visit: Payer: Medicare Other | Attending: Acute Care

## 2022-04-02 DIAGNOSIS — K4020 Bilateral inguinal hernia, without obstruction or gangrene, not specified as recurrent: Secondary | ICD-10-CM

## 2022-04-02 HISTORY — DX: Bilateral inguinal hernia, without obstruction or gangrene, not specified as recurrent: K40.20

## 2022-04-03 ENCOUNTER — Ambulatory Visit: Payer: Self-pay | Admitting: Surgery

## 2022-04-03 NOTE — H&P (Signed)
Subjective:   CC: Non-recurrent bilateral inguinal hernia without obstruction or gangrene [K40.20]  HPI:  Ruth Morgan is a 67 y.o. female who returns for evaluation of above. Underwent second opinion at [facility], now wants to return to [facility] for surgery.  Pain has continued intermittently since last exam.  She has since quit smoking but still take BC powder.    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  Past Surgical History:  Past Surgical History:  Procedure Laterality Date   PHOTOREFRACTIVE KERATOTOMY/LASIK Bilateral 01/02/2004   Dr. Maudie Mercury   EYE SURGERY Right 2016   cataracts   EXTRACTION CATARACT EXTRACAPSULAR W/INSERTION INTRAOCULAR PROSTHESIS Right 05/04/2014   Procedure: R--EXTRACTION CATARACT EXTRACAPSULAR WITH PHACO WITH INSERTION INTRAOCULAR PROSTHESIS;  Surgeon: Shirlee More, MD;  Location: [place];  Service: Ophthalmology;  Laterality: Right;   EXTRACTION CATARACT EXTRACAPSULAR W/INSERTION INTRAOCULAR PROSTHESIS Left 06/09/2014   Procedure: L--EXTRACTION CATARACT EXTRACAPSULAR WITH PHACO WITH INSERTION INTRAOCULAR PROSTHESIS;  Surgeon: Shirlee More, MD;  Location: [place];  Service: Ophthalmology;  Laterality: Left;  pre phone 6/6-amf   COLONOSCOPY  06/02/2021   (6) Tubular adenomas/(1)Hyperplastic polyp/PHx CP/Repeat 39yrs/SMR   AMPUTATION FINGER / THUMB     FUNCTIONAL ENDOSCOPIC SINUS SURGERY     HYSTERECTOMY     HYSTERECTOMY VAGINAL W/REMOVAL TUBES &/OR OVARIES      Family History: family history includes Blindness in her sister.  Social History:   reports that she quit smoking about 17 months ago. Her smoking use included cigarettes. She has a 30 pack-year smoking history. She has been exposed to tobacco smoke. She quit smokeless tobacco use about 17 months ago. She reports that she does not drink alcohol and does not use drugs.  Current Medications: has a current medication list which includes the following prescription(s): acetaminophen, albuterol mdi (proventil, ventolin, proair) hfa, breztri aerosphere, cholecalciferol, estradiol, lisinopril, multivitamin, rosuvastatin, incruse ellipta, dextroamphetamine-amphetamine, and gabapentin.  Allergies:  Allergies as of 04/03/2022   (No Known Allergies)    ROS:  A 15 point review of systems was performed and pertinent positives and negatives noted in HPI   Objective:     BP 137/76   Pulse 96   Ht 154.9 cm (5' 0.98")   Wt 51.6 kg (113 lb 12.1 oz)   BMI 21.51 kg/m   Constitutional :  alert, appears stated age, cooperative and no distress  Lymphatics/Throat:  no asymmetry, masses, or scars  Respiratory:  clear to auscultation bilaterally  Cardiovascular:  regular rate and rhythm  Gastrointestinal: soft, non-tender; bowel sounds normal; no masses,  no organomegaly. inguinal hernia noted.  small, reducible, no overlying skin changes and left, possible right  Musculoskeletal: Steady gait and movement  Skin: Cool and moist  Psychiatric: Normal affect, non-agitated, not confused       LABS:  n/a   RADS: CLINICAL DATA:  Left lower quadrant pain.  Acute abdominal pain   EXAM:  CT ABDOMEN AND PELVIS WITH CONTRAST   TECHNIQUE:  Multidetector CT imaging of the abdomen and pelvis was performed  using the standard protocol following bolus administration of  intravenous contrast.   CONTRAST:  80mL OMNIPAQUE IOHEXOL 300 MG/ML  SOLN   COMPARISON:  None.   FINDINGS:  Lower chest:  No contributory findings.  Hepatobiliary: No focal liver abnormality.No evidence of biliary   obstruction or stone.   Pancreas: Unremarkable.   Spleen: Unremarkable.   Adrenals/Urinary Tract: Negative adrenals. No hydronephrosis or  stone. 4 cm simple right lower pole renal cyst. Unremarkable  bladder.   Stomach/Bowel:  No obstruction. Appendectomy.   Vascular/Lymphatic: No acute vascular abnormality. No mass or  adenopathy.   Reproductive:Hysterectomy and possible oophorectomies.   Other: Left inguinal hernia containing congested/stranded fat,  likely the symptomatic finding. There is also fatty enlargement of  the right inguinal canal.   Musculoskeletal: No acute abnormalities. Disc degeneration with  dextroscoliosis and L4-5 right foraminal narrowing.   IMPRESSION:  Left inguinal hernia containing congested or inflamed fat, likely  the symptomatic finding.    Electronically Signed    By: Monte Fantasia M.D.    On: 09/19/2018 05:57  Assessment:       Non-recurrent bilateral inguinal hernia without obstruction or gangrene [K40.20]   Aspirin use  Plan:     1. Non-recurrent bilateral inguinal hernia without obstruction or gangrene [K40.20]   Discussed the risk of surgery including recurrence, which can be up to 50% in the case of incisional or complex hernias, possible use of prosthetic materials (mesh) and the increased risk of mesh infxn if used, bleeding, chronic pain, post-op infxn, post-op SBO or ileus, and possible re-operation to address said risks. The risks of general anesthetic, if used, includes MI, CVA, sudden death or even reaction to anesthetic medications also discussed. Alternatives include continued observation.  Benefits include possible symptom relief, prevention of incarceration, strangulation, enlargement in size over time, and the risk of emergency surgery in the face of strangulation.   Typical post-op recovery time of 3-5 days with 2 weeks of activity restrictions were also discussed.  ED return precautions given for sudden increase in  pain, size of hernia with accompanying fever, nausea, and/or vomiting.  The patient verbalized understanding and all questions were answered to the patient's satisfaction.   2. Patient has elected to proceed with surgical treatment. Procedure will be scheduled.  Will wait at minimum 7 days due to [facility] powder use this am.  Specifically stated we cannot proceed with surgery if she continues BC powder use.  She stated she will stop.  Gabapentin ordered in the meantime for symptom control, to avoid BC powder use.  Percocet and ibuprofen has not worked in the past.  robotic assisted laparoscopic, bilateral

## 2022-04-03 NOTE — H&P (View-Only) (Signed)
Subjective:   CC: Non-recurrent bilateral inguinal hernia without obstruction or gangrene [K40.20]  HPI:  Ruth Morgan is a 66 y.o. female who returns for evaluation of above. Underwent second opinion at [facility], now wants to return to [facility] for surgery.  Pain has continued intermittently since last exam.  She has since quit smoking but still take BC powder.    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  Past Surgical History:  Past Surgical History:  Procedure Laterality Date   PHOTOREFRACTIVE KERATOTOMY/LASIK Bilateral 01/02/2004   Dr. Kim   EYE SURGERY Right 2016   cataracts   EXTRACTION CATARACT EXTRACAPSULAR W/INSERTION INTRAOCULAR PROSTHESIS Right 05/04/2014   Procedure: R--EXTRACTION CATARACT EXTRACAPSULAR WITH PHACO WITH INSERTION INTRAOCULAR PROSTHESIS;  Surgeon: Anupama Betkerur Horne, MD;  Location: [facility] OR;  Service: Ophthalmology;  Laterality: Right;   EXTRACTION CATARACT EXTRACAPSULAR W/INSERTION INTRAOCULAR PROSTHESIS Left 06/09/2014   Procedure: L--EXTRACTION CATARACT EXTRACAPSULAR WITH PHACO WITH INSERTION INTRAOCULAR PROSTHESIS;  Surgeon: Anupama Betkerur Horne, MD;  Location: [facility] OR;  Service: Ophthalmology;  Laterality: Left;  pre phone 6/6-amf   COLONOSCOPY  06/02/2021   (6) Tubular adenomas/(1)Hyperplastic polyp/PHx CP/Repeat 3yrs/SMR   AMPUTATION FINGER / THUMB     FUNCTIONAL ENDOSCOPIC SINUS SURGERY     HYSTERECTOMY     HYSTERECTOMY VAGINAL W/REMOVAL TUBES &/OR OVARIES      Family History: family history includes Blindness in her sister.  Social History:   reports that she quit smoking about 17 months ago. Her smoking use included cigarettes. She has a 30 pack-year smoking history. She has been exposed to tobacco smoke. She quit smokeless tobacco use about 17 months ago. She reports that she does not drink alcohol and does not use drugs.  Current Medications: has a current medication list which includes the following prescription(s): acetaminophen, albuterol mdi (proventil, ventolin, proair) hfa, breztri aerosphere, cholecalciferol, estradiol, lisinopril, multivitamin, rosuvastatin, incruse ellipta, dextroamphetamine-amphetamine, and gabapentin.  Allergies:  Allergies as of 04/03/2022   (No Known Allergies)    ROS:  A 15 point review of systems was performed and pertinent positives and negatives noted in HPI   Objective:     BP 137/76   Pulse 96   Ht 154.9 cm (5' 0.98")   Wt 51.6 kg (113 lb 12.1 oz)   BMI 21.51 kg/m   Constitutional :  alert, appears stated age, cooperative and no distress  Lymphatics/Throat:  no asymmetry, masses, or scars  Respiratory:  clear to auscultation bilaterally  Cardiovascular:  regular rate and rhythm  Gastrointestinal: soft, non-tender; bowel sounds normal; no masses,  no organomegaly. inguinal hernia noted.  small, reducible, no overlying skin changes and left, possible right  Musculoskeletal: Steady gait and movement  Skin: Cool and moist  Psychiatric: Normal affect, non-agitated, not confused       LABS:  n/a   RADS: CLINICAL DATA:  Left lower quadrant pain.  Acute abdominal pain   EXAM:  CT ABDOMEN AND PELVIS WITH CONTRAST   TECHNIQUE:  Multidetector CT imaging of the abdomen and pelvis was performed  using the standard protocol following bolus administration of  intravenous contrast.   CONTRAST:  75mL OMNIPAQUE IOHEXOL 300 MG/ML  SOLN   COMPARISON:  None.   FINDINGS:  Lower chest:  No contributory findings.     Hepatobiliary: No focal liver abnormality.No evidence of biliary   obstruction or stone.   Pancreas: Unremarkable.   Spleen: Unremarkable.   Adrenals/Urinary Tract: Negative adrenals. No hydronephrosis or  stone. 4 cm simple right lower pole renal cyst. Unremarkable  bladder.   Stomach/Bowel:  No obstruction. Appendectomy.   Vascular/Lymphatic: No acute vascular abnormality. No mass or  adenopathy.   Reproductive:Hysterectomy and possible oophorectomies.   Other: Left inguinal hernia containing congested/stranded fat,  likely the symptomatic finding. There is also fatty enlargement of  the right inguinal canal.   Musculoskeletal: No acute abnormalities. Disc degeneration with  dextroscoliosis and L4-5 right foraminal narrowing.   IMPRESSION:  Left inguinal hernia containing congested or inflamed fat, likely  the symptomatic finding.    Electronically Signed    By: Jonathon  Watts M.D.    On: 09/19/2018 05:57  Assessment:       Non-recurrent bilateral inguinal hernia without obstruction or gangrene [K40.20]   Aspirin use  Plan:     1. Non-recurrent bilateral inguinal hernia without obstruction or gangrene [K40.20]   Discussed the risk of surgery including recurrence, which can be up to 50% in the case of incisional or complex hernias, possible use of prosthetic materials (mesh) and the increased risk of mesh infxn if used, bleeding, chronic pain, post-op infxn, post-op SBO or ileus, and possible re-operation to address said risks. The risks of general anesthetic, if used, includes MI, CVA, sudden death or even reaction to anesthetic medications also discussed. Alternatives include continued observation.  Benefits include possible symptom relief, prevention of incarceration, strangulation, enlargement in size over time, and the risk of emergency surgery in the face of strangulation.   Typical post-op recovery time of 3-5 days with 2 weeks of activity restrictions were also discussed.  ED return precautions given for sudden increase in  pain, size of hernia with accompanying fever, nausea, and/or vomiting.  The patient verbalized understanding and all questions were answered to the patient's satisfaction.   2. Patient has elected to proceed with surgical treatment. Procedure will be scheduled.  Will wait at minimum 7 days due to BC powder use this am.  Specifically stated we cannot proceed with surgery if she continues BC powder use.  She stated she will stop.  Gabapentin ordered in the meantime for symptom control, to avoid BC powder use.  Percocet and ibuprofen has not worked in the past.  robotic assisted laparoscopic, bilateral   

## 2022-04-19 ENCOUNTER — Inpatient Hospital Stay
Admission: RE | Admit: 2022-04-19 | Discharge: 2022-04-19 | Disposition: A | Discharge status: Home / Self Care | Payer: Medicare Other | Source: Ambulatory Visit

## 2022-04-19 NOTE — Patient Instructions (Signed)
Your procedure is scheduled on: 04/27/22 - Friday Report to the Registration Desk on the 1st floor of the [facility]. To find out your arrival time, please call [phone] between 1PM - 3PM on: 04/26/22 - Thursday If your arrival time is 6:00 am, do not arrive before that time as the [facility] entrance doors do not open until 6:00 am.  REMEMBER: Instructions that are not followed completely may result in serious medical risk, up to and including death; or upon the discretion of your surgeon and anesthesiologist your surgery may need to be rescheduled.  Do not eat food after midnight the night before surgery.  No gum chewing or hard candies.  You may however, drink CLEAR liquids up to 2 hours before you are scheduled to arrive for your surgery. Do not drink anything within 2 hours of your scheduled arrival time.  Clear liquids include: - water  - apple juice without pulp - gatorade (not RED colors) - black coffee or tea (Do NOT add milk or creamers to the coffee or tea) Do NOT drink anything that is not on this list.  One week prior to surgery: Stop taking beginning 04/20/22 all Anti-inflammatories (NSAIDS) such as Advil, Aleve, Ibuprofen, Motrin, Naproxen, Naprosyn and Aspirin based products such as Excedrin, Goody's Powder, BC Powder.  Stop ANY OVER THE COUNTER supplements until after surgery.  You may however, continue to take Tylenol if needed for pain up until the day of surgery.  TAKE ONLY THESE MEDICATIONS THE MORNING OF SURGERY WITH A SIP OF WATER:  amphetamine-dextroamphetamine (ADDERALL)   2.   Use albuterol (VENTOLIN HFA)  on the day of surgery and bring to the hospital.   No Alcohol for 24 hours before or after surgery.  No Smoking including e-cigarettes for 24 hours before surgery.  No chewable tobacco products for at least 6 hours before surgery.  No nicotine patches on the day of surgery.  Do not use any "recreational" drugs for at least a week  (preferably 2 weeks) before your surgery.  Please be advised that the combination of cocaine and anesthesia may have negative outcomes, up to and including death. If you test positive for cocaine, your surgery will be cancelled.  On the morning of surgery brush your teeth with toothpaste and water, you may rinse your mouth with mouthwash if you wish. Do not swallow any toothpaste or mouthwash.  Use CHG Soap or wipes as directed on instruction sheet.  Do not wear jewelry, make-up, hairpins, clips or nail polish.  Do not wear lotions, powders, or perfumes.   Do not shave body hair from the neck down 48 hours before surgery.  Contact lenses, hearing aids and dentures may not be worn into surgery.  Do not bring valuables to the hospital. [facility] is not responsible for any missing/lost belongings or valuables.   Total Shoulder Arthroplasty:  use Benzoyl Peroxide 5% Gel as directed on instruction sheet.  Bring your C-PAP to the hospital in case you may have to spend the night.   Notify your doctor if there is any change in your medical condition (cold, fever, infection).  Wear comfortable clothing (specific to your surgery type) to the hospital.  After surgery, you can help prevent lung complications by doing breathing exercises.  Take deep breaths and cough every 1-2 hours. Your doctor may order a device called an Incentive Spirometer to help you take deep breaths. When coughing or sneezing, hold a pillow firmly against your incision with both  hands. This is called "splinting." Doing this helps protect your incision. It also decreases belly discomfort.  If you are being admitted to the hospital overnight, leave your suitcase in the car. After surgery it may be brought to your room.  In case of increased patient census, it may be necessary for you, the patient, to continue your postoperative care in the Same Day Surgery department.  If you are being discharged the day of surgery, you  will not be allowed to drive home. You will need a responsible individual to drive you home and stay with you for 24 hours after surgery.   If you are taking public transportation, you will need to have a responsible individual with you.  Please call the Pre-admissions Testing Dept. at [phone] if you have any questions about these instructions.  Surgery Visitation Policy:  Patients having surgery or a procedure may have two visitors.  Children under the age of 101 must have an adult with them who is not the patient.  Inpatient Visitation:    Visiting hours are 7 a.m. to 8 p.m. Up to four visitors are allowed at one time in a patient room. The visitors may rotate out with other people during the day.  One visitor age 52 or older may stay with the patient overnight and must be in the room by 8 p.m.

## 2022-04-20 ENCOUNTER — Other Ambulatory Visit: Payer: Self-pay | Admitting: Internal Medicine

## 2022-04-20 ENCOUNTER — Inpatient Hospital Stay: Admission: RE | Admit: 2022-04-20 | Payer: Medicare Other | Source: Ambulatory Visit

## 2022-04-20 DIAGNOSIS — R079 Chest pain, unspecified: Secondary | ICD-10-CM

## 2022-04-23 ENCOUNTER — Encounter
Admission: RE | Admit: 2022-04-23 | Discharge: 2022-04-23 | Disposition: A | Discharge status: Home / Self Care | Payer: Medicare Other | Source: Ambulatory Visit | Attending: Surgery | Admitting: Surgery

## 2022-04-23 VITALS — Ht 61.0 in | Wt 118.0 lb

## 2022-04-23 DIAGNOSIS — Z01812 Encounter for preprocedural laboratory examination: Secondary | ICD-10-CM

## 2022-04-23 DIAGNOSIS — I10 Essential (primary) hypertension: Secondary | ICD-10-CM

## 2022-04-23 HISTORY — DX: Attention-deficit hyperactivity disorder, unspecified type: F90.9

## 2022-04-23 HISTORY — DX: Orthostatic hypotension: I95.1

## 2022-04-23 HISTORY — DX: Unspecified asthma, uncomplicated: J45.909

## 2022-04-23 HISTORY — DX: Gastro-esophageal reflux disease without esophagitis: K21.9

## 2022-04-23 HISTORY — DX: Pure hypercholesterolemia, unspecified: E78.00

## 2022-04-23 HISTORY — DX: Unspecified cataract: H26.9

## 2022-04-23 HISTORY — DX: Essential (primary) hypertension: I10

## 2022-04-23 HISTORY — DX: Hyperlipidemia, unspecified: E78.5

## 2022-04-23 NOTE — Patient Instructions (Addendum)
Your procedure is scheduled on: Friday, April 26 Report to the Registration Desk on the 1st floor of the [facility] To find out your arrival time, please call [phone] between 1PM - 3PM on: Thursday, April 25 If your arrival time is 6:00 am, do not arrive before that time as the [facility] entrance doors do not open until 6:00 am.  REMEMBER: Instructions that are not followed completely may result in serious medical risk, up to and including death; or upon the discretion of your surgeon and anesthesiologist your surgery may need to be rescheduled.  Do not eat food after midnight the night before surgery.  No gum chewing or hard candies.  You may however, drink CLEAR liquids up to 2 hours before you are scheduled to arrive for your surgery. Do not drink anything within 2 hours of your scheduled arrival time.  Clear liquids include: - water  - apple juice without pulp - gatorade (not RED colors) - black coffee or tea (Do NOT add milk or creamers to the coffee or tea) Do NOT drink anything that is not on this list.  One week prior to surgery: starting today, April 22 Stop Anti-inflammatories (NSAIDS) such as Advil, Aleve, Ibuprofen, Motrin, Naproxen, Naprosyn and Aspirin based products such as Excedrin, Goody's Powder, BC Powder. Stop ANY OVER THE COUNTER supplements until after surgery. Stop multiple vitamin, vitamin D. You may however, continue to take Tylenol if needed for pain up until the day of surgery.  Continue taking all prescribed medications   TAKE ONLY THESE MEDICATIONS THE MORNING OF SURGERY  Breztri inhaler  Use inhalers on the day of surgery and bring albuterol inhaler to the hospital.  No Alcohol for 24 hours before or after surgery.  No Smoking including e-cigarettes for 24 hours before surgery.  No chewable tobacco products for at least 6 hours before surgery.  No nicotine patches on the day of surgery.  Do not use any "recreational" drugs for at least  a week (preferably 2 weeks) before your surgery.  Please be advised that the combination of cocaine and anesthesia may have negative outcomes, up to and including death. If you test positive for cocaine, your surgery will be cancelled.  On the morning of surgery brush your teeth with toothpaste and water, you may rinse your mouth with mouthwash if you wish. Do not swallow any toothpaste or mouthwash.  Use CHG Soap as directed on instruction sheet.  Do not wear jewelry, make-up, hairpins, clips or nail polish.  Do not wear lotions, powders, or perfumes.   Do not shave body hair from the neck down 48 hours before surgery.  Contact lenses, hearing aids and dentures may not be worn into surgery.  Do not bring valuables to the hospital. [facility] is not responsible for any missing/lost belongings or valuables.   Notify your doctor if there is any change in your medical condition (cold, fever, infection).  Wear comfortable clothing (specific to your surgery type) to the hospital.  After surgery, you can help prevent lung complications by doing breathing exercises.  Take deep breaths and cough every 1-2 hours. Your doctor may order a device called an Incentive Spirometer to help you take deep breaths. When coughing or sneezing, hold a pillow firmly against your incision with both hands. This is called "splinting." Doing this helps protect your incision. It also decreases belly discomfort.  If you are being discharged the day of surgery, you will not be allowed to drive home. You  will need a responsible individual to drive you home and stay with you for 24 hours after surgery.   If you are taking public transportation, you will need to have a responsible individual with you.  Please call the Pre-admissions Testing Dept. at [phone] if you have any questions about these instructions.  Surgery Visitation Policy:  Patients having surgery or a procedure may have two visitors.   Children under the age of 61 must have an adult with them who is not the patient.     Preparing for Surgery with CHLORHEXIDINE GLUCONATE (CHG) Soap  Chlorhexidine Gluconate (CHG) Soap  o An antiseptic cleaner that kills germs and bonds with the skin to continue killing germs even after washing  o Used for showering the night before surgery and morning of surgery  Before surgery, you can play an important role by reducing the number of germs on your skin.  CHG (Chlorhexidine gluconate) soap is an antiseptic cleanser which kills germs and bonds with the skin to continue killing germs even after washing.  Please do not use if you have an allergy to CHG or antibacterial soaps. If your skin becomes reddened/irritated stop using the CHG.  1. Shower the NIGHT BEFORE SURGERY and the MORNING OF SURGERY with CHG soap.  2. If you choose to wash your hair, wash your hair first as usual with your normal shampoo.  3. After shampooing, rinse your hair and body thoroughly to remove the shampoo.  4. Use CHG as you would any other liquid soap. You can apply CHG directly to the skin and wash gently with a scrungie or a clean washcloth.  5. Apply the CHG soap to your body only from the neck down. Do not use on open wounds or open sores. Avoid contact with your eyes, ears, mouth, and genitals (private parts). Wash face and genitals (private parts) with your normal soap.  6. Wash thoroughly, paying special attention to the area where your surgery will be performed.  7. Thoroughly rinse your body with warm water.  8. Do not shower/wash with your normal soap after using and rinsing off the CHG soap.  9. Pat yourself dry with a clean towel.  10. Wear clean pajamas to bed the night before surgery.  12. Place clean sheets on your bed the night of your first shower and do not sleep with pets.  13. Shower again with the CHG soap on the day of surgery prior to arriving at the hospital.  14. Do not  apply any deodorants/lotions/powders.  15. Please wear clean clothes to the hospital.

## 2022-04-24 ENCOUNTER — Encounter
Admission: RE | Admit: 2022-04-24 | Discharge: 2022-04-24 | Disposition: A | Discharge status: Home / Self Care | Payer: Medicare Other | Source: Ambulatory Visit | Attending: Surgery | Admitting: Surgery

## 2022-04-24 ENCOUNTER — Encounter: Payer: Self-pay | Admitting: Surgery

## 2022-04-24 DIAGNOSIS — R9431 Abnormal electrocardiogram [ECG] [EKG]: Secondary | ICD-10-CM | POA: Insufficient documentation

## 2022-04-24 DIAGNOSIS — E785 Hyperlipidemia, unspecified: Secondary | ICD-10-CM | POA: Insufficient documentation

## 2022-04-24 DIAGNOSIS — Z01818 Encounter for other preprocedural examination: Secondary | ICD-10-CM | POA: Insufficient documentation

## 2022-04-24 DIAGNOSIS — I10 Essential (primary) hypertension: Secondary | ICD-10-CM | POA: Insufficient documentation

## 2022-04-24 DIAGNOSIS — R7303 Prediabetes: Secondary | ICD-10-CM | POA: Insufficient documentation

## 2022-04-24 DIAGNOSIS — I2089 Other forms of angina pectoris: Secondary | ICD-10-CM | POA: Insufficient documentation

## 2022-04-24 DIAGNOSIS — Z01810 Encounter for preprocedural cardiovascular examination: Secondary | ICD-10-CM

## 2022-04-24 DIAGNOSIS — Z01812 Encounter for preprocedural laboratory examination: Secondary | ICD-10-CM

## 2022-04-24 LAB — CBC
HCT: 43.2 % (ref 36.0–46.0)
Hemoglobin: 13.9 g/dL (ref 12.0–15.0)
MCH: 31 pg (ref 26.0–34.0)
MCHC: 32.2 g/dL (ref 30.0–36.0)
MCV: 96.4 fL (ref 80.0–100.0)
Platelets: 401 10*3/uL — ABNORMAL HIGH (ref 150–400)
RBC: 4.48 MIL/uL (ref 3.87–5.11)
RDW: 12.8 % (ref 11.5–15.5)
WBC: 7.1 10*3/uL (ref 4.0–10.5)
nRBC: 0 % (ref 0.0–0.2)

## 2022-04-24 LAB — BASIC METABOLIC PANEL
Anion gap: 7 (ref 5–15)
BUN: 14 mg/dL (ref 8–23)
CO2: 27 mmol/L (ref 22–32)
Calcium: 9.2 mg/dL (ref 8.9–10.3)
Chloride: 102 mmol/L (ref 98–111)
Creatinine, Ser: 0.81 mg/dL (ref 0.44–1.00)
GFR, Estimated: >60 mL/min (ref >60)
Glucose, Bld: 111 mg/dL — ABNORMAL HIGH (ref 70–99)
Potassium: 4.3 mmol/L (ref 3.5–5.1)
Sodium: 136 mmol/L (ref 135–145)

## 2022-04-24 NOTE — Progress Notes (Signed)
Perioperative / Anesthesia Services  Pre-Admission Testing Clinical Review / Preoperative Anesthesia Consult  Date: 04/25/22  Patient Demographics:  Name: Ruth Morgan DOB:   [birth year] MRN:   [id number]  Planned Surgical Procedure(s):    Case: [id number] Date/Time: 04/27/22 0715   Procedure: XI ROBOTIC ASSISTED INGUINAL HERNIA (Bilateral: Inguinal)   Anesthesia type: General   Pre-op diagnosis: non recurrent bilateral inguinal hernia w/o obstruction or gangrene K40.20   Location: [facility] OR ROOM 04 / [facility]   Surgeons: Sung Amabile, DO     NOTE: Available PAT nursing documentation and vital signs have been reviewed. Clinical nursing staff has updated patient's PMH/PSHx, current medication list, and drug allergies/intolerances to ensure comprehensive history available to assist in medical decision making as it pertains to the aforementioned surgical procedure and anticipated anesthetic course. Extensive review of available clinical information personally performed. Ruth Morgan PMH and PSHx updated with any diagnoses/procedures that  may have been inadvertently omitted during her intake with the pre-admission testing department's nursing staff.  Clinical Discussion:  Ruth Morgan is a 67 y.o. female who is submitted for pre-surgical anesthesia review and clearance prior to her undergoing the above procedure.Patient is a Former Smoker (34.5 pack years; quit 12/2021). Pertinent PMH includes: CAD, diastolic dysfunction, angina, HTN, HLD, prediabetes, COPD, asthma, lumbar DDD, BILATERAL inguinal hernias, anxiety, chronic opioid therapy.  Patient is followed by cardiology Juliann Pares, MD). She was last seen in the cardiology clinic on 04/18/2022; notes reviewed. At the time of her clinic visit, patient complaining of chest pain at rest.  Patient with associated shortness of breath, however this was felt to be associated with her underlying COPD.  Patient reported that her  breathing was stable and at baseline; followed by pulmonary medicine.  Patient denied any PND, orthopnea, palpitations, significant peripheral edema, vertiginous symptoms, or presyncope/syncope. Patient with a past medical history significant for cardiovascular diagnoses. Documented physical exam was grossly benign, providing no evidence of acute exacerbation and/or decompensation of the patient's known cardiovascular conditions.  Stress echocardiogram was performed on 04/30/2014 revealing a mildly reduced left ventricular systolic function with an EF of 40%.  Global hypokinesis noted.  Patient able to achieve her [facility] following 9 minutes and 9 seconds of activity; maximum heart rate achieved 131 bpm.  Maximum workload was 10.50 METS.  Trivial tricuspid and pulmonary, mild mitral, and moderate aortic valve regurgitation noted.  All transvalvular gradients were noted be normal providing no evidence suggestive of valvular stenosis.  TTE performed on 03/01/2021 revealed a low normal left-ventricular systolic function with an EF of 50%.  There were no regional wall motion abnormalities. Left ventricular diastolic Doppler parameters consistent with abnormal relaxation (G1DD).  Trivial mitral and pulmonic, mild tricuspid, and moderate aortic valve regurgitation noted.  Again, all transvalvular gradients were noted to be normal providing no evidence suggestive of valvular stenosis.  There was no pericardial effusion.  Myocardial perfusion imaging study performed on 03/01/2021 revealed a mildly reduced left-ventricular systolic function with an EF of 45-50%.  There was no evidence of stress-induced myocardial ischemia or arrhythmia; no sonographic evidence of scar.  Study determined to be low risk overall.  Cardiac CTA was performed on 03/23/2021 revealing a low coronary calcium score of 1.22 Agatston units.  This was a 48 percentile for age and sex matched control.  Coronary calcium in the RCA distribution.  Heart  healthy diet and risk factor modification recommended.  Blood pressure well controlled at 124/68 mmHg on currently prescribed ACEi (lisinopril) monotherapy.  Patient is on rosuvastatin for her HLD diagnosis and ASCVD prevention.  Patient has a prediabetes diagnosis; last HgbA1c was 6.1% when checked on 07/21/2021.  Patient does not have an OSAH diagnosis.  Functional capacity somewhat limited by patient's age and multiple medical comorbidities.  However, with that being said, patient still felt to be able to achieve at least 4 METS of physical activity without experiencing any degree of significant angina/anginal equivalent symptoms.  No changes were made to her medication regimen.  Patient to follow-up with outpatient cardiology in 6 months or sooner if needed.  Ruth Morgan is scheduled for an elective XI ROBOTIC ASSISTED INGUINAL HERNIA  on 04/27/2022 with Dr. Sung Amabile, DO. Given patient's past medical history significant for cardiovascular diagnoses, presurgical cardiac clearance was sought by the PAT team. Per cardiology, "this patient is optimized for surgery and may proceed with the planned procedural course with a LOW risk of significant perioperative cardiovascular complications".  In review of her medication reconciliation, the patient is not noted to be taking any type of anticoagulation or antiplatelet therapies that would need to be held during her perioperative course.  Patient denies previous perioperative complications with anesthesia in the past. In review of the available records, it is noted that patient underwent a general anesthetic course here at [facility] ([facility]) in 06/2021 without documented complications.      04/23/2022    4:21 PM 06/02/2021    2:02 PM 06/02/2021    1:42 PM  Vitals with BMI  Height     Weight 118 lbs    BMI 22.31    Systolic  148   Diastolic  69   Pulse   86    Providers/Specialists:   NOTE: Primary  physician provider listed below. Patient may have been seen by APP or partner within same practice.   PROVIDER ROLE / SPECIALTY LAST Michelle Nasuti, DO General Surgery (Surgeon) 04/03/2022  Lorrine Kin, MD Primary Care Provider 04/17/2022  Rudean Hitt, MD Cardiology 04/18/2022  Lequita Asal, MD Pulmonary Medicine 01/19/2022   Allergies:  Patient has no known allergies.  Current Home Medications:   No current facility-administered medications for this encounter.    albuterol (VENTOLIN HFA) 108 (90 Base) MCG/ACT inhaler   amphetamine-dextroamphetamine (ADDERALL) 20 MG tablet   Budeson-Glycopyrrol-Formoterol (BREZTRI AEROSPHERE) 160-9-4.8 MCG/ACT AERO   Cholecalciferol (VITAMIN D-3) 25 MCG (1000 UT) CAPS   gabapentin (NEURONTIN) 100 MG capsule   lisinopril (ZESTRIL) 10 MG tablet   MULTIPLE VITAMIN PO   oxyCODONE (OXY IR/ROXICODONE) 5 MG immediate release tablet   rosuvastatin (CRESTOR) 20 MG tablet   History:   Past Medical History:  Diagnosis Date   ADHD (attention deficit hyperactivity disorder)    a.) on amphetamine-dextroamphetamine   Angina at rest    Anxiety state, unspecified 12/05/2012   Asthma    Bilateral inguinal hernia 04/2022   CAD (coronary artery disease) 03/23/2021   a.) MPI 03/01/2021: no ischemia; b.) cCTA 03/23/2021: Ca score 1.22 (48th percentile for age/sex match control); 1-99% cor Ca in RCA distribution   Carpal tunnel syndrome    right - repaired. Left - not repaired   Chronic, continuous use of opioids    COPD (chronic obstructive pulmonary disease)    DDD (degenerative disc disease), lumbar    by MRI   Diastolic dysfunction 03/01/2021   a.) TTE 03/01/2021: EF 50%, triv MR/PR, mild TR, mod AR, G1DD   GERD (gastroesophageal reflux disease)  Headache    History of 2019 novel coronavirus disease (COVID-19) 2021   History of bilateral cataract extraction 2016   History of shingles 2013   Hyperlipidemia    Hypertension     Orthostatic hypotension    Prediabetes 12/05/2012   Shingles 06/25/2017   Smoker 12/05/2012   Tubular adenoma of colon 2010   Past Surgical History:  Procedure Laterality Date   APPENDECTOMY  1972   BREAST BIOPSY Left    needle bx /clip neg   BROW LIFT Bilateral 04/16/2017   Procedure: BLEPHAROPLASTY UPPER EYELID: W/EXCESS SKIN;  Surgeon: Imagene Riches, MD;  Location: [facility];  Service: Ophthalmology;  Laterality: Bilateral;   CARPAL TUNNEL RELEASE Right 2014   CATARACT EXTRACTION W/ INTRAOCULAR LENS IMPLANT Right 05/04/2014   CATARACT EXTRACTION W/ INTRAOCULAR LENS IMPLANT Left 06/09/2014   COLONOSCOPY N/A 06/02/2021   Procedure: COLONOSCOPY;  Surgeon: Jaynie Collins, DO;  Location: [facility];  Service: Gastroenterology;  Laterality: N/A;   LASIK  2006   NASAL SINUS SURGERY  2004   TOTAL ABDOMINAL HYSTERECTOMY W/ BILATERAL SALPINGOOPHORECTOMY  1985   benign ovarian tumors   Family History  Problem Relation Age of Onset   Diabetes Sister    Diabetes Brother    Cancer Father        stomach   CAD Father 27       MI   Kidney failure Father        ESRD on HD   Hyperlipidemia Father    Cancer Paternal Grandfather        throat   Cancer Paternal Grandmother        ovarian   CAD Sister        MI   CAD Brother        MI   Hypertension Mother    Breast cancer Niece 62   Social History   Tobacco Use   Smoking status: Former    Packs/day: 0.75    Years: 46.00    Additional pack years: 0.00    Total pack years: 34.50    Types: Cigarettes    Quit date: 12/2021    Years since quitting: 0.3   Smokeless tobacco: Never   Tobacco comments:    .5 ppd currently  Vaping Use   Vaping Use: Never used  Substance Use Topics   Alcohol use: Yes    Comment: rare   Drug use: No    Pertinent Clinical Results:  LABS:   Hospital Outpatient Visit on 04/24/2022  Component Date Value Ref Range Status   Sodium 04/24/2022 136  135 - 145 mmol/L Final    Potassium 04/24/2022 4.3  3.5 - 5.1 mmol/L Final   Chloride 04/24/2022 102  98 - 111 mmol/L Final   CO2 04/24/2022 27  22 - 32 mmol/L Final   Glucose, Bld 04/24/2022 111 (H)  70 - 99 mg/dL Final   Glucose reference range applies only to samples taken after fasting for at least 8 hours.   BUN 04/24/2022 14  8 - 23 mg/dL Final   Creatinine, Ser 04/24/2022 0.81  0.44 - 1.00 mg/dL Final   Calcium 16/10/9602 9.2  8.9 - 10.3 mg/dL Final   GFR, Estimated 04/24/2022 >60  >60 mL/min Final   Comment: (NOTE) Calculated using the CKD-EPI Creatinine Equation (2021)    Anion gap 04/24/2022 7  5 - 15 Final   Performed at [facility], [address] [place], [place] [address]   WBC 04/24/2022  7.1  4.0 - 10.5 K/uL Final   RBC 04/24/2022 4.48  3.87 - 5.11 MIL/uL Final   Hemoglobin 04/24/2022 13.9  12.0 - 15.0 g/dL Final   HCT 16/10/9602 43.2  36.0 - 46.0 % Final   MCV 04/24/2022 96.4  80.0 - 100.0 fL Final   MCH 04/24/2022 31.0  26.0 - 34.0 pg Final   MCHC 04/24/2022 32.2  30.0 - 36.0 g/dL Final   RDW 54/09/8117 12.8  11.5 - 15.5 % Final   Platelets 04/24/2022 401 (H)  150 - 400 K/uL Final   nRBC 04/24/2022 0.0  0.0 - 0.2 % Final   Performed at [facility], [address] [place], [place] [address]    ECG: Date: 04/25/2022 Time ECG obtained: 1511 PM Rate: 90 bpm Rhythm: normal sinus Axis (leads I and aVF): Normal Intervals: PR 150 ms. QRS 86 ms. QTc 447 ms. ST segment and T wave changes: No evidence of acute ST segment elevation or depression Comparison: Similar to previous tracing obtained on 02/17/2018   IMAGING / PROCEDURES: CT CARDIAC SCORING performed on 03/23/2021 Low coronary calcium score of 1.22. This was 48th percentile for age and sex matched control. CAC 1-99 in RCA. CAC-DRS A1/N1. Continue heart healthy lifestyle and risk factor modification.  MYOCARDIAL PERFUSION IMAGING STUDY (LEXISCAN) performed on 03/01/2021 Mildly reduced left ventricular systolic  function with an EF of 45-50% No artifact Left ventricular cavity size normal No evidence of stress-induced myocardial ischemia or arrhythmia; no scintigraphic evidence of scar Low risk scan  TRANSTHORACIC ECHOCARDIOGRAM performed on 03/01/2021 Low normal left ventricular systolic function with an EF of 50% Left ventricular diastolic Doppler parameters consistent with abnormal relaxation (G1DD). Normal right ventricular size and function Trivial MR and TR Mild TR Moderate AR Normal gradients; no valvular stenosis No pericardial effusion  Impression and Plan:  Ruth Morgan has been referred for pre-anesthesia review and clearance prior to her undergoing the planned anesthetic and procedural courses. Available labs, pertinent testing, and imaging results were personally reviewed by me in preparation for upcoming operative/procedural course. [facility] medical record has been updated following extensive record review and patient interview with PAT staff.   This patient has been appropriately cleared by cardiology with an overall LOW risk of significant perioperative cardiovascular complications. Based on clinical review performed today (04/25/22), barring any significant acute changes in the patient's overall condition, it is anticipated that she will be able to proceed with the planned surgical intervention. Any acute changes in clinical condition may necessitate her procedure being postponed and/or cancelled. Patient will meet with anesthesia team (MD and/or CRNA) on the day of her procedure for preoperative evaluation/assessment. Questions regarding anesthetic course will be fielded at that time.   Pre-surgical instructions were reviewed with the patient during her PAT appointment, and questions were fielded to satisfaction by PAT clinical staff. She has been instructed on which medications that she will need to hold prior to surgery, as well as the ones that have been deemed  safe/appropriate to take of the day of her procedure. As part of the general education provided by PAT, patient made aware both verbally and in writing, that she would need to abstain from the use of any illegal substances during her perioperative course.  She was advised that failure to follow the provided instructions could necessitate case cancellation or result serious perioperative complications up to and including death. Patient encouraged to contact PAT and/or her surgeon's office to discuss any questions or concerns that may arise  prior to surgery; verbalized understanding.   Quentin Mulling, MSN, APRN, FNP-C, CEN [facility]  Peri-operative Services Nurse Practitioner Phone: [phone] Fax: [phone] 04/25/22 10:51 AM  NOTE: This note has been prepared using Dragon dictation software. Despite my best ability to proofread, there is always the potential that unintentional transcriptional errors may still occur from this process.

## 2022-04-26 MED ORDER — CHLORHEXIDINE GLUCONATE CLOTH 2 % EX PADS
6.0000 | MEDICATED_PAD | Freq: Once | CUTANEOUS | Status: AC
  Administered 2022-04-27: 6 via TOPICAL

## 2022-04-26 MED ORDER — CHLORHEXIDINE GLUCONATE 0.12 % MT SOLN
15.0000 mL | Freq: Once | OROMUCOSAL | Status: AC
  Administered 2022-04-27: 15 mL via OROMUCOSAL

## 2022-04-26 MED ORDER — GABAPENTIN 300 MG PO CAPS
300.0000 mg | ORAL_CAPSULE | ORAL | Status: AC
  Administered 2022-04-27: 300 mg via ORAL

## 2022-04-26 MED ORDER — ORAL CARE MOUTH RINSE: 15.0000 mL | Freq: Once | OROMUCOSAL | Status: AC

## 2022-04-26 MED ORDER — CELECOXIB 200 MG PO CAPS
200.0000 mg | ORAL_CAPSULE | ORAL | Status: AC
  Administered 2022-04-27: 200 mg via ORAL

## 2022-04-26 MED ORDER — LACTATED RINGERS IV SOLN: INTRAVENOUS | Status: DC

## 2022-04-26 MED ORDER — ACETAMINOPHEN 500 MG PO TABS
1000.0000 mg | ORAL_TABLET | ORAL | Status: AC
  Administered 2022-04-27: 1000 mg via ORAL

## 2022-04-26 MED ORDER — CEFAZOLIN SODIUM-DEXTROSE 2-4 GM/100ML-% IV SOLN
2.0000 g | INTRAVENOUS | Status: AC
  Administered 2022-04-27: 2 g via INTRAVENOUS

## 2022-04-26 MED ORDER — FAMOTIDINE 20 MG PO TABS
20.0000 mg | ORAL_TABLET | Freq: Once | ORAL | Status: AC
  Administered 2022-04-27: 20 mg via ORAL

## 2022-04-27 ENCOUNTER — Other Ambulatory Visit: Payer: Self-pay

## 2022-04-27 ENCOUNTER — Ambulatory Visit: Payer: Medicare Other | Admitting: Urgent Care

## 2022-04-27 ENCOUNTER — Encounter
Admission: RE | Disposition: A | Discharge status: Home / Self Care | Payer: Self-pay | Source: Home / Self Care | Attending: Surgery

## 2022-04-27 ENCOUNTER — Ambulatory Visit
Admission: RE | Admit: 2022-04-27 | Discharge: 2022-04-27 | Disposition: A | Discharge status: Home / Self Care | Payer: Medicare Other | Attending: Surgery | Admitting: Surgery

## 2022-04-27 ENCOUNTER — Encounter: Payer: Self-pay | Admitting: Surgery

## 2022-04-27 DIAGNOSIS — Z09 Encounter for follow-up examination after completed treatment for conditions other than malignant neoplasm: Secondary | ICD-10-CM | POA: Insufficient documentation

## 2022-04-27 DIAGNOSIS — E785 Hyperlipidemia, unspecified: Secondary | ICD-10-CM

## 2022-04-27 DIAGNOSIS — J449 Chronic obstructive pulmonary disease, unspecified: Secondary | ICD-10-CM | POA: Diagnosis not present

## 2022-04-27 DIAGNOSIS — I2510 Atherosclerotic heart disease of native coronary artery without angina pectoris: Secondary | ICD-10-CM | POA: Diagnosis not present

## 2022-04-27 DIAGNOSIS — F419 Anxiety disorder, unspecified: Secondary | ICD-10-CM | POA: Diagnosis not present

## 2022-04-27 DIAGNOSIS — I10 Essential (primary) hypertension: Secondary | ICD-10-CM | POA: Insufficient documentation

## 2022-04-27 DIAGNOSIS — Z01810 Encounter for preprocedural cardiovascular examination: Secondary | ICD-10-CM

## 2022-04-27 DIAGNOSIS — K4020 Bilateral inguinal hernia, without obstruction or gangrene, not specified as recurrent: Secondary | ICD-10-CM | POA: Insufficient documentation

## 2022-04-27 DIAGNOSIS — I2089 Other forms of angina pectoris: Secondary | ICD-10-CM

## 2022-04-27 DIAGNOSIS — Z7722 Contact with and (suspected) exposure to environmental tobacco smoke (acute) (chronic): Secondary | ICD-10-CM | POA: Insufficient documentation

## 2022-04-27 DIAGNOSIS — Z87891 Personal history of nicotine dependence: Secondary | ICD-10-CM | POA: Diagnosis not present

## 2022-04-27 DIAGNOSIS — K219 Gastro-esophageal reflux disease without esophagitis: Secondary | ICD-10-CM | POA: Diagnosis not present

## 2022-04-27 DIAGNOSIS — R7303 Prediabetes: Secondary | ICD-10-CM

## 2022-04-27 HISTORY — DX: Opioid use, unspecified, uncomplicated: F11.90

## 2022-04-27 HISTORY — DX: Other forms of angina pectoris: I20.89

## 2022-04-27 HISTORY — PX: INSERTION OF MESH: SHX5868

## 2022-04-27 SURGERY — HERNIORRHAPHY, INGUINAL, ROBOT-ASSISTED, LAPAROSCOPIC
Anesthesia: General | Site: Inguinal | Laterality: Bilateral

## 2022-04-27 MED ORDER — ONDANSETRON HCL 4 MG/2ML IJ SOLN
INTRAMUSCULAR | Status: DC | PRN
  Administered 2022-04-27: 4 mg via INTRAVENOUS

## 2022-04-27 MED ORDER — IBUPROFEN 800 MG PO TABS: 800.0000 mg | ORAL_TABLET | Freq: Three times a day (TID) | ORAL | 0 refills | Status: DC | PRN

## 2022-04-27 MED ORDER — MIDAZOLAM HCL 2 MG/2ML IJ SOLN
INTRAMUSCULAR | Status: AC
  Filled 2022-04-27: qty 2

## 2022-04-27 MED ORDER — MIDAZOLAM HCL 2 MG/2ML IJ SOLN
INTRAMUSCULAR | Status: DC | PRN
  Administered 2022-04-27: 2 mg via INTRAVENOUS

## 2022-04-27 MED ORDER — CELECOXIB 200 MG PO CAPS
ORAL_CAPSULE | ORAL | Status: AC
  Filled 2022-04-27: qty 1

## 2022-04-27 MED ORDER — BUPIVACAINE-EPINEPHRINE 0.5% -1:200000 IJ SOLN
INTRAMUSCULAR | Status: DC | PRN
  Administered 2022-04-27: 30 mL

## 2022-04-27 MED ORDER — DOCUSATE SODIUM 100 MG PO CAPS: 100.0000 mg | ORAL_CAPSULE | Freq: Two times a day (BID) | ORAL | 0 refills | Status: AC | PRN

## 2022-04-27 MED ORDER — DEXAMETHASONE SODIUM PHOSPHATE 10 MG/ML IJ SOLN
INTRAMUSCULAR | Status: DC | PRN
  Administered 2022-04-27: 10 mg via INTRAVENOUS

## 2022-04-27 MED ORDER — BUPIVACAINE HCL (PF) 0.5 % IJ SOLN
INTRAMUSCULAR | Status: AC
  Filled 2022-04-27: qty 30

## 2022-04-27 MED ORDER — METOPROLOL TARTRATE 5 MG/5ML IV SOLN
INTRAVENOUS | Status: DC | PRN
  Administered 2022-04-27: 3 mg via INTRAVENOUS

## 2022-04-27 MED ORDER — GABAPENTIN 300 MG PO CAPS
ORAL_CAPSULE | ORAL | Status: AC
  Filled 2022-04-27: qty 1

## 2022-04-27 MED ORDER — BUPIVACAINE LIPOSOME 1.3 % IJ SUSP
INTRAMUSCULAR | Status: DC | PRN
  Administered 2022-04-27: 20 mL

## 2022-04-27 MED ORDER — FENTANYL CITRATE (PF) 100 MCG/2ML IJ SOLN
INTRAMUSCULAR | Status: DC | PRN
  Administered 2022-04-27 (×2): 50 ug via INTRAVENOUS

## 2022-04-27 MED ORDER — CEFAZOLIN SODIUM-DEXTROSE 2-4 GM/100ML-% IV SOLN
INTRAVENOUS | Status: AC
  Filled 2022-04-27: qty 100

## 2022-04-27 MED ORDER — FENTANYL CITRATE (PF) 100 MCG/2ML IJ SOLN
INTRAMUSCULAR | Status: AC
  Filled 2022-04-27: qty 2

## 2022-04-27 MED ORDER — BUPIVACAINE LIPOSOME 1.3 % IJ SUSP
INTRAMUSCULAR | Status: AC
  Filled 2022-04-27: qty 20

## 2022-04-27 MED ORDER — SEVOFLURANE IN SOLN
RESPIRATORY_TRACT | Status: AC
  Filled 2022-04-27: qty 250

## 2022-04-27 MED ORDER — CHLORHEXIDINE GLUCONATE 0.12 % MT SOLN
OROMUCOSAL | Status: AC
  Filled 2022-04-27: qty 15

## 2022-04-27 MED ORDER — PROPOFOL 10 MG/ML IV BOLUS
INTRAVENOUS | Status: DC | PRN
  Administered 2022-04-27: 70 mg via INTRAVENOUS

## 2022-04-27 MED ORDER — DEXMEDETOMIDINE HCL IN NACL 200 MCG/50ML IV SOLN
INTRAVENOUS | Status: DC | PRN
  Administered 2022-04-27: 4 ug via INTRAVENOUS
  Administered 2022-04-27: 8 ug via INTRAVENOUS

## 2022-04-27 MED ORDER — LIDOCAINE HCL (CARDIAC) PF 100 MG/5ML IV SOSY
PREFILLED_SYRINGE | INTRAVENOUS | Status: DC | PRN
  Administered 2022-04-27: 40 mg via INTRAVENOUS
  Administered 2022-04-27 (×2): 100 mg via INTRAVENOUS

## 2022-04-27 MED ORDER — ACETAMINOPHEN 325 MG PO TABS: 650.0000 mg | ORAL_TABLET | Freq: Three times a day (TID) | ORAL | 0 refills | Status: AC | PRN

## 2022-04-27 MED ORDER — OXYCODONE HCL 5 MG PO TABS
ORAL_TABLET | ORAL | Status: AC
  Filled 2022-04-27: qty 1

## 2022-04-27 MED ORDER — SUGAMMADEX SODIUM 200 MG/2ML IV SOLN
INTRAVENOUS | Status: DC | PRN
  Administered 2022-04-27: 200 mg via INTRAVENOUS

## 2022-04-27 MED ORDER — ONDANSETRON HCL 4 MG/2ML IJ SOLN: 4.0000 mg | Freq: Once | INTRAMUSCULAR | Status: DC | PRN

## 2022-04-27 MED ORDER — FENTANYL CITRATE (PF) 100 MCG/2ML IJ SOLN
25.0000 ug | INTRAMUSCULAR | Status: DC | PRN
  Administered 2022-04-27 (×4): 25 ug via INTRAVENOUS

## 2022-04-27 MED ORDER — PROPOFOL 10 MG/ML IV BOLUS
INTRAVENOUS | Status: AC
  Filled 2022-04-27: qty 40

## 2022-04-27 MED ORDER — OXYCODONE HCL 5 MG/5ML PO SOLN: 5.0000 mg | Freq: Once | ORAL | Status: AC | PRN

## 2022-04-27 MED ORDER — ACETAMINOPHEN 500 MG PO TABS
ORAL_TABLET | ORAL | Status: AC
  Filled 2022-04-27: qty 2

## 2022-04-27 MED ORDER — FAMOTIDINE 20 MG PO TABS
ORAL_TABLET | ORAL | Status: AC
  Filled 2022-04-27: qty 1

## 2022-04-27 MED ORDER — ROCURONIUM BROMIDE 100 MG/10ML IV SOLN
INTRAVENOUS | Status: DC | PRN
  Administered 2022-04-27: 10 mg via INTRAVENOUS
  Administered 2022-04-27: 20 mg via INTRAVENOUS
  Administered 2022-04-27: 40 mg via INTRAVENOUS

## 2022-04-27 MED ORDER — PHENYLEPHRINE HCL (PRESSORS) 10 MG/ML IV SOLN
INTRAVENOUS | Status: DC | PRN
  Administered 2022-04-27 (×3): 160 ug via INTRAVENOUS

## 2022-04-27 MED ORDER — OXYCODONE HCL 5 MG PO TABS: 5.0000 mg | ORAL_TABLET | Freq: Two times a day (BID) | ORAL | 0 refills | Status: DC | PRN

## 2022-04-27 MED ORDER — OXYCODONE HCL 5 MG PO TABS
5.0000 mg | ORAL_TABLET | Freq: Once | ORAL | Status: AC | PRN
  Administered 2022-04-27: 5 mg via ORAL

## 2022-04-27 MED ORDER — EPINEPHRINE PF 1 MG/ML IJ SOLN
INTRAMUSCULAR | Status: AC
  Filled 2022-04-27: qty 1

## 2022-04-27 SURGICAL SUPPLY — 49 items
ADH SKN CLS APL DERMABOND .7 (GAUZE/BANDAGES/DRESSINGS) ×1
BAG PRESSURE INF REUSE 1000 (BAG) IMPLANT
BLADE SURG SZ11 CARB STEEL (BLADE) ×1 IMPLANT
BNDG GAUZE DERMACEA FLUFF 4 (GAUZE/BANDAGES/DRESSINGS) ×1 IMPLANT
BNDG GZE DERMACEA 4 6PLY (GAUZE/BANDAGES/DRESSINGS)
COVER TIP SHEARS 8 DVNC (MISCELLANEOUS) ×1 IMPLANT
COVER WAND RF STERILE (DRAPES) ×1 IMPLANT
DERMABOND ADVANCED .7 DNX12 (GAUZE/BANDAGES/DRESSINGS) ×1 IMPLANT
DRAPE ARM DVNC X/XI (DISPOSABLE) ×3 IMPLANT
DRAPE COLUMN DVNC XI (DISPOSABLE) ×1 IMPLANT
ELECT CAUTERY BLADE 6.4 (BLADE) IMPLANT
ELECT REM PT RETURN 9FT ADLT (ELECTROSURGICAL) ×1
ELECTRODE REM PT RTRN 9FT ADLT (ELECTROSURGICAL) ×1 IMPLANT
FORCEPS BPLR FENES DVNC XI (FORCEP) ×1 IMPLANT
GLOVE BIOGEL PI IND STRL 7.0 (GLOVE) ×2 IMPLANT
GLOVE SURG SYN 6.5 ES PF (GLOVE) ×2 IMPLANT
GLOVE SURG SYN 6.5 PF PI (GLOVE) ×2 IMPLANT
GOWN STRL REUS W/ TWL LRG LVL3 (GOWN DISPOSABLE) ×3 IMPLANT
GOWN STRL REUS W/TWL LRG LVL3 (GOWN DISPOSABLE) ×4
IRRIGATOR SUCT 8 DISP DVNC XI (IRRIGATION / IRRIGATOR) IMPLANT
IV NS 1000ML (IV SOLUTION)
IV NS 1000ML BAXH (IV SOLUTION) IMPLANT
LABEL OR SOLS (LABEL) IMPLANT
MANIFOLD NEPTUNE II (INSTRUMENTS) ×1 IMPLANT
MESH 3DMAX MID 4X6 LT LRG (Mesh General) IMPLANT
MESH 3DMAX MID 4X6 RT LRG (Mesh General) IMPLANT
NDL DRIVE SUT CUT DVNC (INSTRUMENTS) ×1 IMPLANT
NDL HYPO 22X1.5 SAFETY MO (MISCELLANEOUS) ×1 IMPLANT
NDL INSUFFLATION 14GA 120MM (NEEDLE) ×1 IMPLANT
NEEDLE DRIVE SUT CUT DVNC (INSTRUMENTS) ×1 IMPLANT
NEEDLE HYPO 22X1.5 SAFETY MO (MISCELLANEOUS) ×1 IMPLANT
NEEDLE INSUFFLATION 14GA 120MM (NEEDLE) ×1 IMPLANT
OBTURATOR OPTICAL STND 8 DVNC (TROCAR) ×1
OBTURATOR OPTICALSTD 8 DVNC (TROCAR) ×1 IMPLANT
PACK LAP CHOLECYSTECTOMY (MISCELLANEOUS) ×1 IMPLANT
PENCIL SMOKE EVACUATOR (MISCELLANEOUS) ×1 IMPLANT
SCISSORS MNPLR CVD DVNC XI (INSTRUMENTS) ×1 IMPLANT
SEAL UNIV 5-12 XI (MISCELLANEOUS) ×3 IMPLANT
SET TUBE SMOKE EVAC HIGH FLOW (TUBING) ×1 IMPLANT
SOL ELECTROSURG ANTI STICK (MISCELLANEOUS) ×1
SOLUTION ELECTROSURG ANTI STCK (MISCELLANEOUS) ×1 IMPLANT
SUT MNCRL 4-0 (SUTURE) ×2
SUT MNCRL 4-0 27XMFL (SUTURE) ×2
SUT VIC AB 2-0 SH 27 (SUTURE) ×2
SUT VIC AB 2-0 SH 27XBRD (SUTURE) ×1 IMPLANT
SUT VLOC 90 6 CV-15 VIOLET (SUTURE) ×2 IMPLANT
SUTURE MNCRL 4-0 27XMF (SUTURE) ×1 IMPLANT
SYR 30ML LL (SYRINGE) ×1 IMPLANT
TAPE TRANSPORE STRL 2 31045 (GAUZE/BANDAGES/DRESSINGS) ×1 IMPLANT

## 2022-04-27 NOTE — Anesthesia Preprocedure Evaluation (Signed)
Anesthesia Evaluation  Patient identified by MRN, date of birth, ID band Patient awake    Reviewed: Allergy & Precautions, NPO status , Patient's Chart, lab work & pertinent test results  History of Anesthesia Complications Negative for: history of anesthetic complications  Airway Mallampati: III  TM Distance: >3 FB Neck ROM: full    Dental  (+) Teeth Intact, Caps   Pulmonary neg sleep apnea, COPD,  COPD inhaler, Patient abstained from smoking.Not current smoker, former smoker   Pulmonary exam normal breath sounds clear to auscultation       Cardiovascular Exercise Tolerance: Good METShypertension, + angina  + CAD  (-) Past MI Normal cardiovascular exam(-) dysrhythmias  Rhythm:Regular Rate:Normal - Systolic murmurs    Neuro/Psych  Headaches PSYCHIATRIC DISORDERS Anxiety        GI/Hepatic Neg liver ROS,GERD  Controlled,,  Endo/Other  neg diabetes  prediabetes  Renal/GU negative Renal ROS  negative genitourinary   Musculoskeletal  (+) Arthritis ,    Abdominal Normal abdominal exam  (+)   Peds  Hematology negative hematology ROS (+)   Anesthesia Other Findings Past Medical History: 12/05/2012: Anxiety state, unspecified No date: Carpal tunnel syndrome     Comment:  right - repaired. Left - not repaired 2010: Colon polyp     Comment:  at [facility] No date: COPD (chronic obstructive pulmonary disease) (HCC) No date: DDD (degenerative disc disease), lumbar     Comment:  by MRI No date: Headache 2013: History of shingles No date: Hyperglycemia 12/05/2012: Prediabetes 12/05/2012: Smoker  Past Surgical History: No date: ABDOMINAL HYSTERECTOMY 1972: APPENDECTOMY No date: BREAST BIOPSY; Left     Comment:  needle bx /clip neg 04/16/2017: BROW LIFT; Bilateral     Comment:  Procedure: BLEPHAROPLASTY UPPER EYELID: W/EXCESS SKIN;                Surgeon: Imagene Riches, MD;  Location: [facility];   Service: Ophthalmology;  Laterality: Bilateral; 2014: CARPAL TUNNEL RELEASE; Right 2006: LASIK 2004: NASAL SINUS SURGERY 1985: TOTAL ABDOMINAL HYSTERECTOMY W/ BILATERAL SALPINGOOPHORECTOMY     Comment:  benign ovarian tumors     Reproductive/Obstetrics negative OB ROS                              Anesthesia Physical Anesthesia Plan  ASA: 2  Anesthesia Plan: General   Post-op Pain Management: Tylenol PO (pre-op)*, Celebrex PO (pre-op)* and Gabapentin PO (pre-op)*   Induction: Intravenous  PONV Risk Score and Plan: 3 and Propofol infusion, TIVA, Ondansetron, Dexamethasone and Midazolam  Airway Management Planned: Oral ETT and Video Laryngoscope Planned  Additional Equipment: None  Intra-op Plan:   Post-operative Plan: Extubation in OR  Informed Consent: I have reviewed the patients History and Physical, chart, labs and discussed the procedure including the risks, benefits and alternatives for the proposed anesthesia with the patient or authorized representative who has indicated his/her understanding and acceptance.     Dental Advisory Given  Plan Discussed with: Anesthesiologist, CRNA and Surgeon  Anesthesia Plan Comments: (Discussed risks of anesthesia with patient, including PONV, sore throat, lip/dental/eye damage. Rare risks discussed as well, such as cardiorespiratory and neurological sequelae, and allergic reactions. Discussed the role of CRNA in patient's perioperative care. Patient understands.)         Anesthesia Quick Evaluation

## 2022-04-27 NOTE — Op Note (Addendum)
Preoperative diagnosis: Bilateral initial reducible inguinal Hernia.  Postoperative diagnosis: Bilateral inguinal Hernia  Procedure: Robotic assisted laparoscopic bilateral inguinal hernia repair with mesh  Anesthesia: General  Surgeon: Dr. Tonna Boehringer  Wound Classification: Clean  Specimen: none  Complications: None  Estimated Blood Loss: 10mL   Indications:  inguinal hernia. Repair was indicated to avoid complications of incarceration, obstruction and pain, and a prosthetic mesh repair was elected.  See H&P for further details.  Findings: 1. Bard 3D max medium weight mesh used for repair 2. Adequate hemostasis achieved  Description of procedure: The patient was taken to the operating room. A time-out was completed verifying correct patient, procedure, site, positioning, and implant(s) and/or special equipment prior to beginning this procedure.  Area was prepped and draped in the usual sterile fashion. An incision was marked 20 cm above the pubic tubercle, slightly above the umbilicus  Scrotum wrapped in Kerlix roll.  Veress needle inserted at palmer's point.  Saline drop test noted to be positive with gradual increase in pressure after initiation of gas insufflation.  15 mm of pressure was achieved prior to removing the Veress needle and then placing a 8 mm port via the Optiview technique through the supraumbilical site.  Inspection of the area afterwards noted no injury to the surrounding organs during insertion of the needle and the port.  2 port sites were marked 8 cm to the lateral sides of the initial port, and a 8 mm robotic port was placed on the left side, another 8 mm robotic port on the right side under direct supervision.  Local anesthesia  infused to the preplanned incision sites prior to insertion of the port.  The [employer] platform was then brought into the operative field and docked to the ports.  Examination of the abdominal cavity noted a bilateral inguinal hernia.  Left  side addressed first.  A peritoneal flap was created approximately 8cm cephalad to the defect by using scissors with electrocautery.  Dissection was carried down towards the pubic tubercle, developing the myopectineal orifice view.  Laterally the flap was carried towards the ASIS.  Small hernia sac was noted, which carefully dissected away from the adjacent tissues to be fully reduced out of hernia cavity.  Any bleeding was controlled with combination of electrocautery and manual pressure.    After confirming adequate dissection and the peritoneal reflection completely down and away from the cord structures, a Large Bard 3DMax medium weight mesh was placed within the anterior abdominal wall, secured in place using 2-0 Vicryl on an SH needle immediately above the pubic tubercle.  After noting proper placement of the mesh with the peritoneal reflection deep to it, the previously created peritoneal flap was secured back up to the anterior abdominal wall using running 3-0 V-Lock.  Both needles were then removed out of the abdominal cavity.  Right side addressed next.  A peritoneal flap was created approximately 8cm cephalad to the defect by using scissors with electrocautery.  Dissection was carried down towards the pubic tubercle, developing the myopectineal orifice view.  Laterally the flap was carried towards the ASIS.  Small hernia sac was noted, which carefully dissected away from the adjacent tissues to be fully reduced out of hernia cavity.  Any bleeding was controlled with combination of electrocautery and manual pressure.    After confirming adequate dissection and the peritoneal reflection completely down and away from the cord structures, a Large Bard 3DMax medium weight mesh was placed within the anterior abdominal wall, secured in  place using 2-0 Vicryl on an SH needle immediately above the pubic tubercle.  After noting proper placement of the mesh with the peritoneal reflection deep to it, the  previously created peritoneal flap was secured back up to the anterior abdominal wall using running 3-0 V-Lock.  Both needles were then removed out of the abdominal cavity. Xi platform undocked from the ports and removed off of operative field.  exparel infused as ilioinguinal block.  Abdomen then desufflated and ports removed. All the skin incisions were then closed with a subcuticular stitch of Monocryl 4-0. Dermabond was applied. The testis was gently pulled down into its anatomic position in the scrotum.  The patient tolerated the procedure well and was taken to the postanesthesia care unit in stable condition. Sponge and instrument count correct at end of procedure.

## 2022-04-27 NOTE — Discharge Instructions (Addendum)
Hernia repair, Care After This sheet gives you information about how to care for yourself after your procedure. Your health care provider may also give you more specific instructions. If you have problems or questions, contact your health care provider. What can I expect after the procedure? After your procedure, it is common to have the following: Pain in your abdomen, especially in the incision areas. You will be given medicine to control the pain. Tiredness. This is a normal part of the recovery process. Your energy level will return to normal over the next several weeks. Changes in your bowel movements, such as constipation or needing to go more often. Talk with your health care provider about how to manage this. Follow these instructions at home: Medicines  tylenol and advil as needed for discomfort.  Please alternate between the two every four hours as needed for pain.    Use narcotics, if prescribed, only when tylenol and motrin is not enough to control pain.  325-650mg every 8hrs to max of 3000mg/24hrs (including the 325mg in every norco dose) for the tylenol.    Advil up to 800mg per dose every 8hrs as needed for pain.   PLEASE RECORD NUMBER OF PILLS TAKEN UNTIL NEXT FOLLOW UP APPT.  THIS WILL HELP DETERMINE HOW READY YOU ARE TO BE RELEASED FROM ANY ACTIVITY RESTRICTIONS Do not drive or use heavy machinery while taking prescription pain medicine. Do not drink alcohol while taking prescription pain medicine.  Incision care    Follow instructions from your health care provider about how to take care of your incision areas. Make sure you: Keep your incisions clean and dry. Wash your hands with soap and water before and after applying medicine to the areas, and before and after changing your bandage (dressing). If soap and water are not available, use hand sanitizer. Change your dressing as told by your health care provider. Leave stitches (sutures), skin glue, or adhesive strips in  place. These skin closures may need to stay in place for 2 weeks or longer. If adhesive strip edges start to loosen and curl up, you may trim the loose edges. Do not remove adhesive strips completely unless your health care provider tells you to do that. Do not wear tight clothing over the incisions. Tight clothing may rub and irritate the incision areas, which may cause the incisions to open. Do not take baths, swim, or use a hot tub until your health care provider approves. OK TO SHOWER IN 24HRS.   Check your incision area every day for signs of infection. Check for: More redness, swelling, or pain. More fluid or blood. Warmth. Pus or a bad smell. Activity Avoid lifting anything that is heavier than 10 lb (4.5 kg) for 2 weeks or until your health care provider says it is okay. No pushing/pulling greater than 30lbs You may resume normal activities as told by your health care provider. Ask your health care provider what activities are safe for you. Take rest breaks during the day as needed. Eating and drinking Follow instructions from your health care provider about what you can eat after surgery. To prevent or treat constipation while you are taking prescription pain medicine, your health care provider may recommend that you: Drink enough fluid to keep your urine clear or pale yellow. Take over-the-counter or prescription medicines. Eat foods that are high in fiber, such as fresh fruits and vegetables, whole grains, and beans. Limit foods that are high in fat and processed sugars, such as fried and   sweet foods. General instructions Ask your health care provider when you will need an appointment to get your sutures or staples removed. Keep all follow-up visits as told by your health care provider. This is important. Contact a health care provider if: You have more redness, swelling, or pain around your incisions. You have more fluid or blood coming from the incisions. Your incisions feel  warm to the touch. You have pus or a bad smell coming from your incisions or your dressing. You have a fever. You have an incision that breaks open (edges not staying together) after sutures or staples have been removed. You develop a rash. You have chest pain or difficulty breathing. You have pain or swelling in your legs. You feel light-headed or you faint. Your abdomen swells (becomes distended). You have nausea or vomiting. You have blood in your stool (feces). This information is not intended to replace advice given to you by your health care provider. Make sure you discuss any questions you have with your health care provider. Document Released: 07/07/2004 Document Revised: 09/06/2017 Document Reviewed: 09/19/2015 Elsevier Interactive Patient Education  2019 [employer]        AMBULATORY SURGERY  DISCHARGE INSTRUCTIONS   The drugs that you were given will stay in your system until tomorrow so for the next 24 hours you should not:  Drive an automobile Make any legal decisions Drink any alcoholic beverage   You may resume regular meals tomorrow.  Today it is better to start with liquids and gradually work up to solid foods.  You may eat anything you prefer, but it is better to start with liquids, then soup and crackers, and gradually work up to solid foods.   Please notify your doctor immediately if you have any unusual bleeding, trouble breathing, redness and pain at the surgery site, drainage, fever, or pain not relieved by medication.     Your post-operative visit with Dr.                                       is: Date:                        Time:    Please call to schedule your post-operative visit.  Additional Instructions: PLEASE LEAVE GREEN/TEAL BRACELET ON FOR 4 DAYS 

## 2022-04-27 NOTE — Transfer of Care (Signed)
Immediate Anesthesia Transfer of Care Note  Patient: Ruth Morgan  Procedure(s) Performed: XI ROBOTIC ASSISTED INGUINAL HERNIA (Bilateral: Inguinal) INSERTION OF MESH (Bilateral: Inguinal)  Patient Location: PACU  Anesthesia Type:General  Level of Consciousness: drowsy  Airway & Oxygen Therapy: Patient Spontanous Breathing and Patient connected to face mask oxygen  Post-op Assessment: Report given to RN and Post -op Vital signs reviewed and stable  Post vital signs: stable  Last Vitals:  Vitals Value Taken Time  BP 130/71 04/27/22 0937  Temp 36 C 04/27/22 0937  Pulse 70 04/27/22 0940  Resp 12 04/27/22 0940  SpO2 100 % 04/27/22 0940  Vitals shown include unvalidated device data.  Last Pain:  Vitals:   04/27/22 0937  TempSrc:   PainSc: Asleep         Complications: No notable events documented.

## 2022-04-27 NOTE — Anesthesia Postprocedure Evaluation (Signed)
Anesthesia Post Note  Patient: Ruth Morgan  Procedure(s) Performed: XI ROBOTIC ASSISTED INGUINAL HERNIA (Bilateral: Inguinal) INSERTION OF MESH (Bilateral: Inguinal)  Patient location during evaluation: PACU Anesthesia Type: General Level of consciousness: awake and alert Pain management: pain level controlled Vital Signs Assessment: post-procedure vital signs reviewed and stable Respiratory status: spontaneous breathing, nonlabored ventilation, respiratory function stable and patient connected to nasal cannula oxygen Cardiovascular status: blood pressure returned to baseline and stable Postop Assessment: no apparent nausea or vomiting Anesthetic complications: no   There were no known notable events for this encounter.   Last Vitals:  Vitals:   04/27/22 1030 04/27/22 1046  BP: 134/84 (!) 133/90  Pulse: 81 80  Resp: 15 14  Temp:  (!) 36.3 C  SpO2: 94% 94%    Last Pain:  Vitals:   04/27/22 1046  TempSrc: Temporal  PainSc: 5                  Corinda Gubler

## 2022-04-27 NOTE — Interval H&P Note (Signed)
No change. OK to proceed.

## 2022-04-27 NOTE — Anesthesia Procedure Notes (Signed)
Procedure Name: Intubation Date/Time: 04/27/2022 8:04 AM  Performed by: Maryla Morrow., CRNAPre-anesthesia Checklist: Patient identified, Patient being monitored, Timeout performed, Emergency Drugs available and Suction available Patient Re-evaluated:Patient Re-evaluated prior to induction Oxygen Delivery Method: Circle system utilized Preoxygenation: Pre-oxygenation with 100% oxygen Induction Type: IV induction Ventilation: Mask ventilation without difficulty Laryngoscope Size: 3 and McGraph Grade View: Grade I Tube type: Oral Tube size: 6.0 mm Number of attempts: 1 Airway Equipment and Method: Stylet Placement Confirmation: ETT inserted through vocal cords under direct vision, positive ETCO2 and breath sounds checked- equal and bilateral Secured at: 18 cm Tube secured with: Tape Dental Injury: Teeth and Oropharynx as per pre-operative assessment

## 2022-05-17 ENCOUNTER — Other Ambulatory Visit: Payer: Self-pay | Admitting: Family Medicine

## 2022-05-17 DIAGNOSIS — Z1231 Encounter for screening mammogram for malignant neoplasm of breast: Secondary | ICD-10-CM

## 2022-05-22 ENCOUNTER — Ambulatory Visit
Admission: RE | Admit: 2022-05-22 | Discharge: 2022-05-22 | Disposition: A | Discharge status: Home / Self Care | Payer: Medicare Other | Source: Ambulatory Visit | Attending: Acute Care | Admitting: Acute Care

## 2022-05-22 DIAGNOSIS — F17210 Nicotine dependence, cigarettes, uncomplicated: Secondary | ICD-10-CM | POA: Diagnosis present

## 2022-05-22 DIAGNOSIS — Z87891 Personal history of nicotine dependence: Secondary | ICD-10-CM

## 2022-05-22 DIAGNOSIS — Z122 Encounter for screening for malignant neoplasm of respiratory organs: Secondary | ICD-10-CM

## 2022-05-25 ENCOUNTER — Other Ambulatory Visit: Payer: Self-pay | Admitting: Acute Care

## 2022-05-25 DIAGNOSIS — Z122 Encounter for screening for malignant neoplasm of respiratory organs: Secondary | ICD-10-CM

## 2022-05-25 DIAGNOSIS — Z87891 Personal history of nicotine dependence: Secondary | ICD-10-CM

## 2022-06-18 ENCOUNTER — Inpatient Hospital Stay: Admission: RE | Admit: 2022-06-18 | Payer: Medicare Other | Source: Ambulatory Visit

## 2022-06-22 IMAGING — MG MM DIGITAL DIAGNOSTIC UNILAT*L* W/ TOMO W/ CAD
8 series · 8 of 24 positions shown · non-contrast
Comparison: Previous exam(s).

CLINICAL DATA: Callback for 2 LEFT breast asymmetries.

EXAM:
DIGITAL DIAGNOSTIC UNILATERAL LEFT MAMMOGRAM WITH TOMOSYNTHESIS AND
CAD; ULTRASOUND LEFT BREAST LIMITED
TECHNIQUE: Left digital diagnostic mammography and breast tomosynthesis was
performed. The images were evaluated with computer-aided detection.;
Targeted ultrasound examination of the left breast was performed.

[L ML synth-2D (1 of 2)]
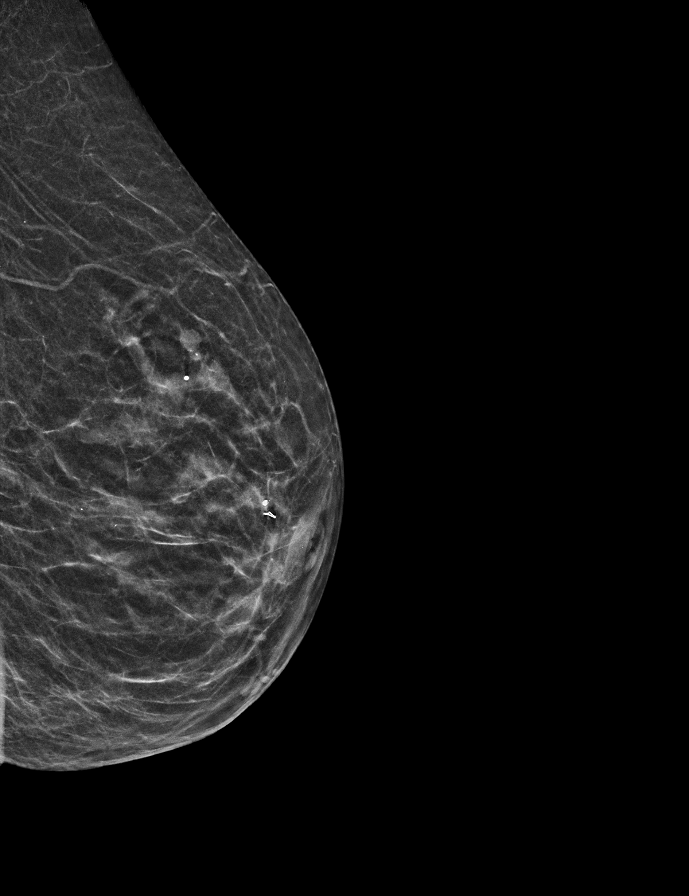

[L CC synth-2D]
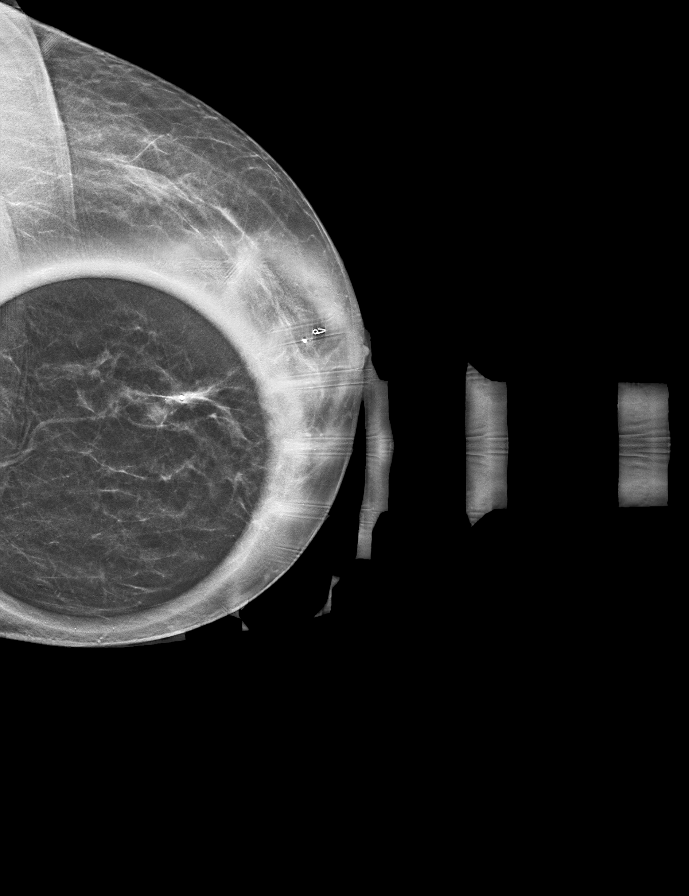

[L MLO synth-2D]
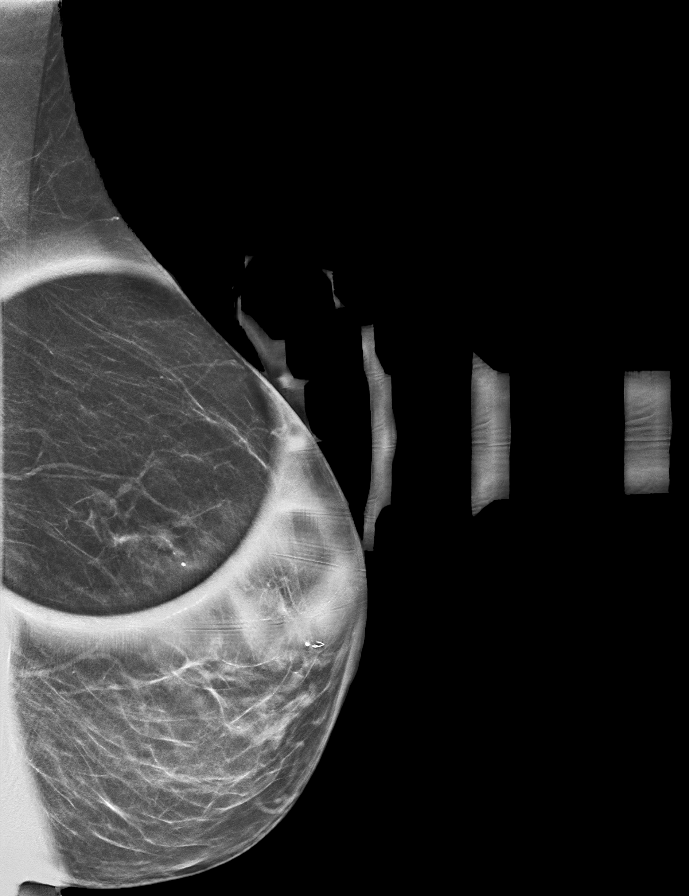

[L ML synth-2D (2 of 2)]
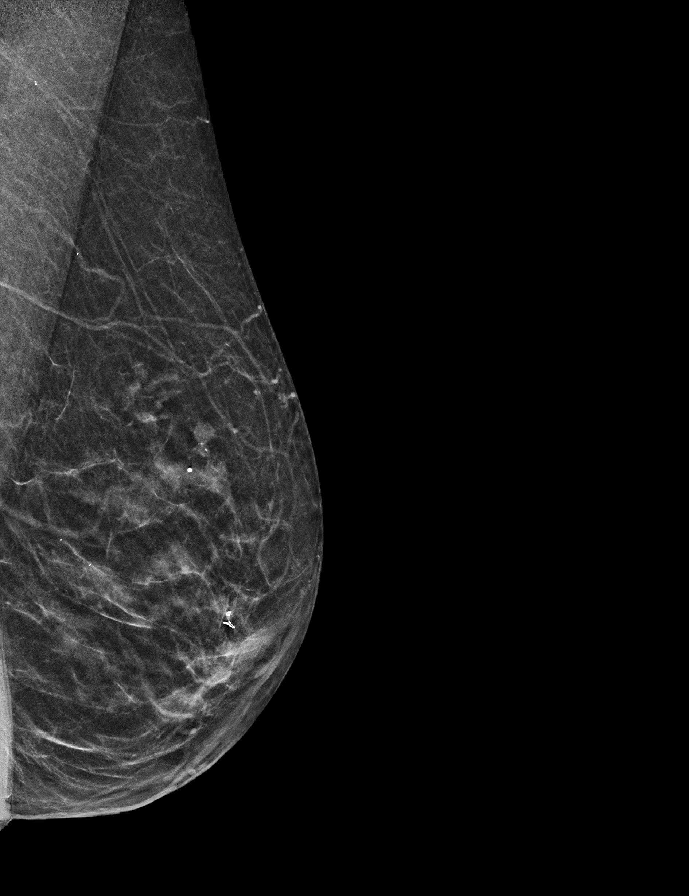

[L ML tomo (1 of 2) · tomo slice 20/39.0]
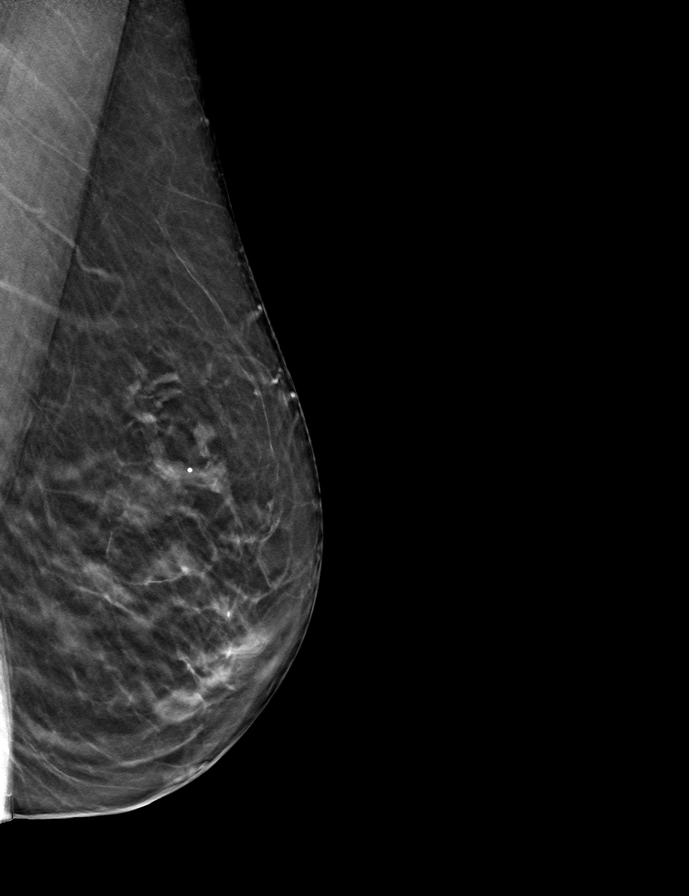

[L CC tomo · tomo slice 21/40.0]
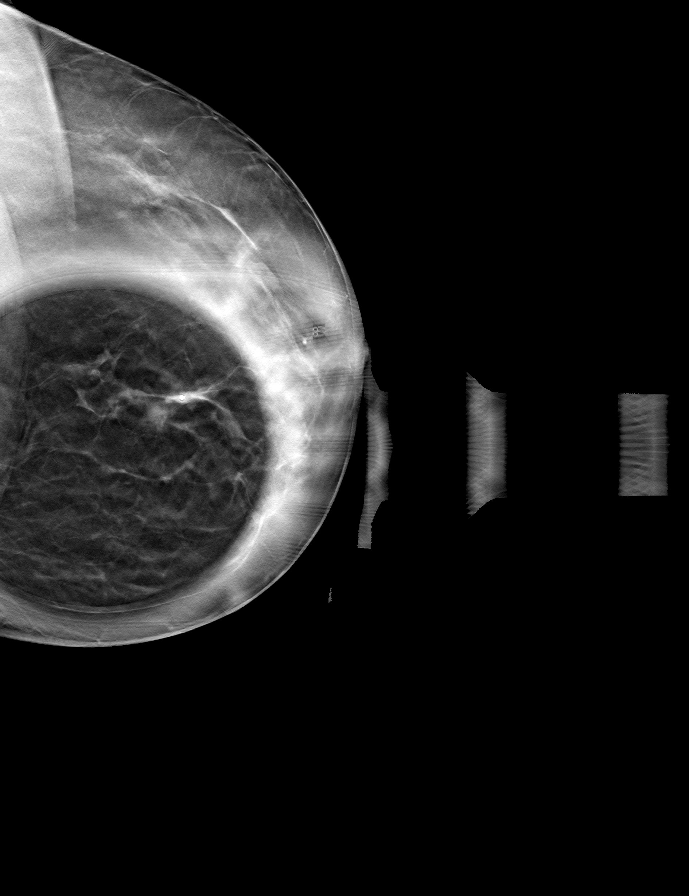

[L MLO tomo · tomo slice 21/41.0]
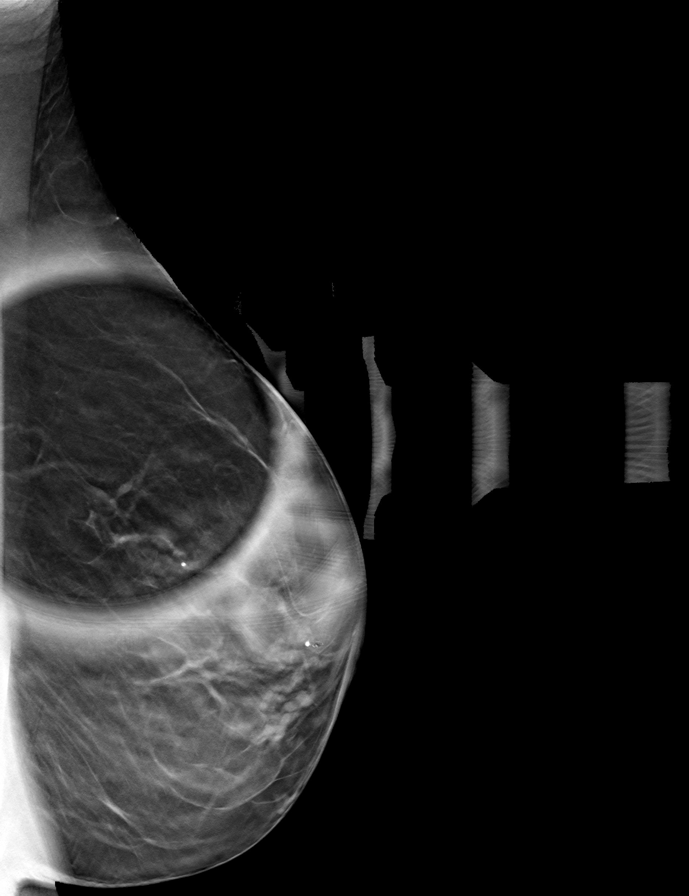

[L ML tomo (2 of 2) · tomo slice 19/36.0]
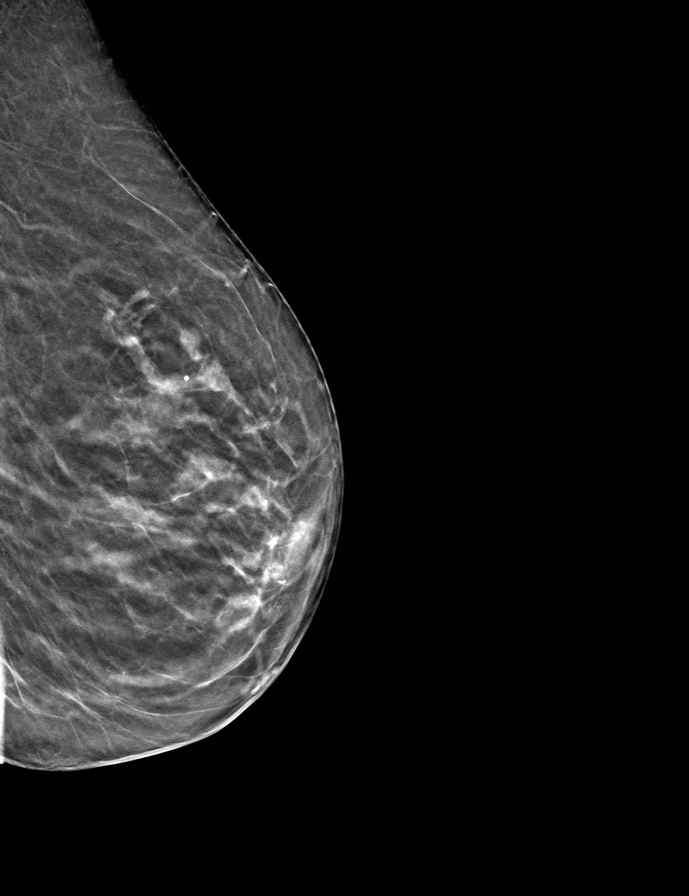

[8 of 24 positions shown; findings below may reference images not displayed]

ACR Breast Density Category b: There are scattered areas of
fibroglandular density.
FINDINGS: Spot compression tomosynthesis views confirm persistence of an oval
circumscribed mass in the LEFT upper breast at middle depth. There
is an additional persistent tubular asymmetry both anterior and
posterior to this oval mass.

Targeted ultrasound was performed of the LEFT upper breast. At 11
o'clock 5 cm from the nipple, there is an oval circumscribed
anechoic mass with posterior acoustic enhancement. It measures 5 x 5
by 5 mm and is consistent with a benign simple cyst. This
corresponds to the oval mass noted mammographically.

Immediately posterior to this cyst is a tangle of anechoic ducts
without suspicious intraductal mass. This corresponds to the
posterior tubular asymmetry is noted mammographically. At [DATE] 5 cm
from the nipple, there is an oval circumscribed anechoic mass versus
focally distended duct which spans 8 x 5 x 3 mm. This corresponds to
the asymmetry noted anterior to the oval mass mammographically. This
is consistent with a benign cyst versus focal duct ectasia.
IMPRESSION: There are benign cysts and duct ectasia at the sites of screening
mammographic concern. No mammographic or sonographic evidence of
malignancy.

RECOMMENDATION:
Screening mammogram in one year.(Code:[id number])

I have discussed the findings and recommendations with the patient.
If applicable, a reminder letter will be sent to the patient
regarding the next appointment.

BI-RADS CATEGORY  2: Benign.

## 2022-07-25 ENCOUNTER — Other Ambulatory Visit: Payer: Self-pay

## 2022-07-25 ENCOUNTER — Telehealth (HOSPITAL_COMMUNITY): Payer: Self-pay | Admitting: *Deleted

## 2022-07-25 MED ORDER — METOPROLOL TARTRATE 100 MG PO TABS
ORAL_TABLET | ORAL | 0 refills | Status: DC
  Filled 2022-07-25: qty 1, 1d supply, fill #0

## 2022-07-25 MED ORDER — IVABRADINE HCL 7.5 MG PO TABS
ORAL_TABLET | ORAL | 0 refills | Status: DC
  Filled 2022-07-25: qty 2, 1d supply, fill #0

## 2022-07-25 NOTE — Telephone Encounter (Signed)
Attempted to call patient regarding upcoming cardiac CT appointment. °Left message on voicemail with name and callback number ° °Merle Prescott RN Navigator Cardiac Imaging °[facility] °[phone] Office °[phone] Cell ° °

## 2022-07-25 NOTE — Telephone Encounter (Signed)
Reaching out to patient to offer assistance regarding upcoming cardiac imaging study; pt verbalizes understanding of appt date/time, parking situation and where to check in, pre-test NPO status and medications ordered, and verified current allergies; name and call back number provided for further questions should they arise  Merle Prescott RN Navigator Cardiac Imaging [facility] [phone] office [phone] cell  Patient to take 100mg metoprolol tartrate and 15mg ivabradine two hours prior to her cardiac CT scan. 

## 2022-07-26 ENCOUNTER — Ambulatory Visit
Admission: RE | Admit: 2022-07-26 | Discharge: 2022-07-26 | Disposition: A | Discharge status: Home / Self Care | Payer: Medicare Other | Source: Ambulatory Visit | Attending: Internal Medicine | Admitting: Internal Medicine

## 2022-07-26 ENCOUNTER — Other Ambulatory Visit: Payer: Self-pay

## 2022-07-26 DIAGNOSIS — R079 Chest pain, unspecified: Secondary | ICD-10-CM | POA: Diagnosis present

## 2022-07-26 DIAGNOSIS — I2510 Atherosclerotic heart disease of native coronary artery without angina pectoris: Secondary | ICD-10-CM | POA: Diagnosis not present

## 2022-07-26 DIAGNOSIS — J439 Emphysema, unspecified: Secondary | ICD-10-CM | POA: Insufficient documentation

## 2022-07-26 DIAGNOSIS — R072 Precordial pain: Secondary | ICD-10-CM | POA: Diagnosis not present

## 2022-07-26 MED ORDER — NITROGLYCERIN 0.4 MG SL SUBL
0.4000 mg | SUBLINGUAL_TABLET | Freq: Once | SUBLINGUAL | Status: AC
  Administered 2022-07-26: 0.4 mg via SUBLINGUAL

## 2022-07-26 MED ORDER — SODIUM CHLORIDE 0.9 % IV BOLUS: 150.0000 mL | Freq: Once | INTRAVENOUS | Status: DC

## 2022-07-26 MED ORDER — IOHEXOL 350 MG/ML SOLN
75.0000 mL | Freq: Once | INTRAVENOUS | Status: AC | PRN
  Administered 2022-07-26: 75 mL via INTRAVENOUS

## 2022-07-26 NOTE — Progress Notes (Signed)
Patient tolerated procedure well. Ambulate w/o difficulty. Denies light headedness or being dizzy. Sitting in chair drinking water provided. Encouraged to drink extra water today and reasoning explained. Verbalized understanding. All questions answered. ABC intact. No further needs. Discharge from procedure area w/o issues.   °

## 2022-09-05 ENCOUNTER — Inpatient Hospital Stay: Admission: RE | Admit: 2022-09-05 | Payer: Medicare Other | Source: Ambulatory Visit

## 2022-09-10 ENCOUNTER — Ambulatory Visit
Admission: RE | Admit: 2022-09-10 | Discharge: 2022-09-10 | Disposition: A | Discharge status: Home / Self Care | Payer: Medicare Other | Source: Ambulatory Visit | Attending: Family Medicine | Admitting: Family Medicine

## 2022-09-10 DIAGNOSIS — Z1231 Encounter for screening mammogram for malignant neoplasm of breast: Secondary | ICD-10-CM | POA: Insufficient documentation

## 2022-09-11 ENCOUNTER — Encounter: Payer: Self-pay | Admitting: Emergency Medicine

## 2022-09-11 ENCOUNTER — Ambulatory Visit
Admission: EM | Admit: 2022-09-11 | Discharge: 2022-09-11 | Disposition: A | Discharge status: Home / Self Care | Payer: Medicare Other | Attending: Urgent Care | Admitting: Urgent Care

## 2022-09-11 DIAGNOSIS — U071 COVID-19: Secondary | ICD-10-CM | POA: Insufficient documentation

## 2022-09-11 LAB — SARS CORONAVIRUS 2 BY RT PCR: SARS Coronavirus 2 by RT PCR: POSITIVE — AB

## 2022-09-11 MED ORDER — MOLNUPIRAVIR EUA 200MG CAPSULE
4.0000 | ORAL_CAPSULE | Freq: Two times a day (BID) | ORAL | 0 refills | Status: AC
Start: 2022-09-11 — End: 2022-09-16

## 2022-09-11 NOTE — Discharge Instructions (Signed)
Diagnosed with COVID-19 and an antiviral medication has been prescribed.  Recommend rest, fluids, over-the-counter medication to control your symptoms including the headache.  If you develop shortness of breath or difficulty breathing, go to the emergency room.

## 2022-09-11 NOTE — ED Triage Notes (Signed)
Pt presents with a cough x 3-4 days. She developed a headache yesterday.  She has been exposed to Covid.

## 2022-09-11 NOTE — ED Provider Notes (Signed)
[facility]    CSN: [id number] Arrival date & time: 09/11/22  1338      History   Chief Complaint Chief Complaint  Patient presents with   Headache   Cough    HPI Ruth Morgan is a 67 y.o. female.    Headache Associated symptoms: cough   Cough Associated symptoms: headaches    Presents to urgent care with complaint of cough x 3 to 4 days.  She states she developed a headache yesterday.  Also endorses exposure to COVID.  Somewhat elevated blood pressure of 164/82.  Treated with lisinopril 10 mg daily for hypertension but recently stopped because of report of chronic cough.  Elevated heart rate of 103 bpm.  She denies loss of appetite, nausea, vomiting, diarrhea.  Denies nasal congestion or upper respiratory symptoms other than cough and headache.  Past Medical History:  Diagnosis Date   ADHD (attention deficit hyperactivity disorder)    a.) on amphetamine-dextroamphetamine   Angina at rest    Anxiety state, unspecified 12/05/2012   Asthma    Bilateral inguinal hernia 04/2022   CAD (coronary artery disease) 03/23/2021   a.) MPI 03/01/2021: no ischemia; b.) cCTA 03/23/2021: Ca score 1.22 (48th percentile for age/sex match control); 1-99% cor Ca in RCA distribution   Carpal tunnel syndrome    right - repaired. Left - not repaired   Chronic, continuous use of opioids    COPD (chronic obstructive pulmonary disease) (HCC)    DDD (degenerative disc disease), lumbar    by MRI   Diastolic dysfunction 03/01/2021   a.) TTE 03/01/2021: EF 50%, triv MR/PR, mild TR, mod AR, G1DD   GERD (gastroesophageal reflux disease)    Headache    History of 2019 novel coronavirus disease (COVID-19) 2021   History of bilateral cataract extraction 2016   History of shingles 2013   Hyperlipidemia    Hypertension    Orthostatic hypotension    Prediabetes 12/05/2012   Shingles 06/25/2017   Smoker 12/05/2012   Tubular adenoma of colon 2010    Patient Active Problem  List   Diagnosis Date Noted   Lesion of lip 05/04/2013   Right knee pain 05/04/2013   Healthcare maintenance 01/02/2013   Prediabetes 12/05/2012   Smoker 12/05/2012   Anxiety state, unspecified 12/05/2012   Headache(784.0) 12/05/2012    Past Surgical History:  Procedure Laterality Date   APPENDECTOMY  1972   BREAST BIOPSY Left    needle bx /clip neg   BROW LIFT Bilateral 04/16/2017   Procedure: BLEPHAROPLASTY UPPER EYELID: W/EXCESS SKIN;  Surgeon: Imagene Riches, MD;  Location: [facility];  Service: Ophthalmology;  Laterality: Bilateral;   CARPAL TUNNEL RELEASE Right 2014   CATARACT EXTRACTION W/ INTRAOCULAR LENS IMPLANT Right 05/04/2014   CATARACT EXTRACTION W/ INTRAOCULAR LENS IMPLANT Left 06/09/2014   COLONOSCOPY N/A 06/02/2021   Procedure: COLONOSCOPY;  Surgeon: Jaynie Collins, DO;  Location: [facility];  Service: Gastroenterology;  Laterality: N/A;   INSERTION OF MESH Bilateral 04/27/2022   Procedure: INSERTION OF MESH;  Surgeon: Sung Amabile, DO;  Location: [facility];  Service: General;  Laterality: Bilateral;   LASIK  2006   NASAL SINUS SURGERY  2004   TOTAL ABDOMINAL HYSTERECTOMY W/ BILATERAL SALPINGOOPHORECTOMY  1985   benign ovarian tumors    OB History   No obstetric history on file.      Home Medications    Prior to Admission medications   Medication Sig Start Date End Date Taking? Authorizing Provider  albuterol (VENTOLIN HFA) 108 (90 Base) MCG/ACT inhaler Inhale 2 puffs into the lungs every 6 (six) hours as needed for wheezing or shortness of breath. 12/16/20   Becky Augusta, NP  amphetamine-dextroamphetamine (ADDERALL) 20 MG tablet Take 30 mg by mouth 2 (two) times daily.    [provider]  Budeson-Glycopyrrol-Formoterol (BREZTRI AEROSPHERE) 160-9-4.8 MCG/ACT AERO Inhale 1 puff into the lungs daily.    [provider]  Cholecalciferol (VITAMIN D-3) 25 MCG (1000 UT) CAPS Take 1 capsule by mouth daily.    [provider]  gabapentin (NEURONTIN) 100 MG capsule Take 100 mg by mouth 2 (two) times daily as needed.    [provider]  ibuprofen (ADVIL) 800 MG tablet Take 1 tablet (800 mg total) by mouth every 8 (eight) hours as needed for mild pain or moderate pain. 04/27/22   Tonna Boehringer, Isami, DO  ivabradine (CORLANOR) 7.5 MG TABS tablet Take tablets (15mg ) TWO hours prior to your cardiac CT scan. 07/25/22   Debbe Odea, MD  lisinopril (ZESTRIL) 10 MG tablet Take 10 mg by mouth at bedtime.    [provider]  metoprolol tartrate (LOPRESSOR) 100 MG tablet Take tablet (100mg ) TWO hours prior to your cardiac CT scan. 07/25/22   Debbe Odea, MD  MULTIPLE VITAMIN PO Take 1 tablet by mouth daily.    [provider]  oxyCODONE (OXY IR/ROXICODONE) 5 MG immediate release tablet Take 1 tablet (5 mg total) by mouth 2 (two) times daily as needed for up to 6 doses for severe pain. 04/27/22   Tonna Boehringer, Isami, DO  rosuvastatin (CRESTOR) 20 MG tablet Take 20 mg by mouth at bedtime. 09/06/20   [provider]    Family History Family History  Problem Relation Age of Onset   Diabetes Sister    Diabetes Brother    Cancer Father        stomach   CAD Father 67       MI   Kidney failure Father        ESRD on HD   Hyperlipidemia Father    Cancer Paternal Grandfather        throat   Cancer Paternal Grandmother        ovarian   CAD Sister        MI   CAD Brother        MI   Hypertension Mother    Breast cancer Niece 4    Social History Social History   Tobacco Use   Smoking status: Former    Current packs/day: 0.00    Average packs/day: 0.8 packs/day for 46.0 years (34.5 ttl pk-yrs)    Types: Cigarettes    Start date: 12/1975    Quit date: 12/2021    Years since quitting: 0.7   Smokeless tobacco: Never   Tobacco comments:    .5 ppd currently  Vaping Use   Vaping status: Never Used  Substance Use Topics   Alcohol use: Yes    Comment: rare   Drug use: No      Allergies   Patient has no known allergies.   Review of Systems Review of Systems  Respiratory:  Positive for cough.   Neurological:  Positive for headaches.     Physical Exam Triage Vital Signs ED Triage Vitals  Encounter Vitals Group     BP 09/11/22 1346 (!) 164/82     Systolic BP Percentile --      Diastolic BP Percentile --  Pulse Rate 09/11/22 1346 (!) 103     Resp 09/11/22 1346 18     Temp 09/11/22 1346 97.8 F (36.6 C)     Temp Source 09/11/22 1346 Oral     SpO2 09/11/22 1346 97 %     Weight --      Height --      Head Circumference --      Peak Flow --      Pain Score 09/11/22 1345 7     Pain Loc --      Pain Education --      Exclude from Growth Chart --    No data found.  Updated Vital Signs BP (!) 164/82 (BP Location: Right Arm)   Pulse (!) 103   Temp 97.8 F (36.6 C) (Oral)   Resp 18   SpO2 97%   Visual Acuity Right Eye Distance:   Left Eye Distance:   Bilateral Distance:    Right Eye Near:   Left Eye Near:    Bilateral Near:     Physical Exam Vitals reviewed.  Constitutional:      Appearance: She is well-developed. She is ill-appearing.  Cardiovascular:     Rate and Rhythm: Normal rate and regular rhythm.  Pulmonary:     Effort: Pulmonary effort is normal.     Breath sounds: Normal breath sounds.  Skin:    General: Skin is warm and dry.  Neurological:     Mental Status: She is alert and oriented to person, place, and time.  Psychiatric:        Mood and Affect: Mood normal.        Behavior: Behavior normal.      UC Treatments / Results  Labs (all labs ordered are listed, but only abnormal results are displayed) Labs Reviewed  SARS CORONAVIRUS 2 BY RT PCR    EKG   Radiology No results found.  Procedures Procedures (including critical care time)  Medications Ordered in UC Medications - No data to display  Initial Impression / Assessment and Plan / UC Course  I have reviewed the triage vital signs and the  nursing notes.  Pertinent labs & imaging results that were available during my care of the patient were reviewed by me and considered in my medical decision making (see chart for details).   Ruth Morgan is a 67 y.o. female presenting with COVID-19. Patient is afebrile without recent antipyretics, satting well on room air. Overall is ill appearing though non-toxic, well hydrated, without respiratory distress. Pulmonary exam is unremarkable.  Lungs CTAB without wheezing, rhonchi, rales. RRR without murmurs, rubs, gallops.  Reviewed relevant chart history.   Patient symptoms are consistent with acute viral process.  She is positive for COVID-19 via PCR swab.  Because of her comorbidities and other, she is contraindicated for Paxlovid.  Prescribing molnupiravir.  Recommend supportive care and use of over-the-counter medication for symptom control as needed.  Discussed need to go to the ER if she develops worsening symptoms.  Counseled patient on potential for adverse effects with medications prescribed/recommended today, ER and return-to-clinic precautions discussed, patient verbalized understanding and agreement with care plan.  Final Clinical Impressions(s) / UC Diagnoses   Final diagnoses:  None   Discharge Instructions   None    ED Prescriptions   None    PDMP not reviewed this encounter.   Charma Igo, [place] 09/11/22 1428

## 2022-09-12 ENCOUNTER — Encounter: Payer: Self-pay | Admitting: Family Medicine

## 2022-09-14 ENCOUNTER — Other Ambulatory Visit: Payer: Self-pay | Admitting: Student

## 2022-09-14 DIAGNOSIS — R928 Other abnormal and inconclusive findings on diagnostic imaging of breast: Secondary | ICD-10-CM

## 2022-09-14 DIAGNOSIS — N63 Unspecified lump in unspecified breast: Secondary | ICD-10-CM

## 2022-09-21 ENCOUNTER — Ambulatory Visit
Admission: RE | Admit: 2022-09-21 | Discharge: 2022-09-21 | Disposition: A | Discharge status: Home / Self Care | Payer: Medicare Other | Source: Ambulatory Visit | Attending: Student | Admitting: Student

## 2022-09-21 ENCOUNTER — Encounter: Payer: Self-pay | Admitting: Student

## 2022-09-21 DIAGNOSIS — N63 Unspecified lump in unspecified breast: Secondary | ICD-10-CM | POA: Diagnosis present

## 2022-09-21 DIAGNOSIS — R928 Other abnormal and inconclusive findings on diagnostic imaging of breast: Secondary | ICD-10-CM

## 2022-09-25 ENCOUNTER — Other Ambulatory Visit: Payer: Self-pay | Admitting: Student

## 2022-09-25 DIAGNOSIS — N6489 Other specified disorders of breast: Secondary | ICD-10-CM

## 2022-09-25 DIAGNOSIS — R928 Other abnormal and inconclusive findings on diagnostic imaging of breast: Secondary | ICD-10-CM

## 2022-10-02 DIAGNOSIS — D0512 Intraductal carcinoma in situ of left breast: Secondary | ICD-10-CM

## 2022-10-02 HISTORY — DX: Intraductal carcinoma in situ of left breast: D05.12

## 2022-10-05 ENCOUNTER — Ambulatory Visit
Admission: RE | Admit: 2022-10-05 | Discharge: 2022-10-05 | Disposition: A | Discharge status: Home / Self Care | Payer: Medicare Other | Source: Ambulatory Visit | Attending: Student | Admitting: Student

## 2022-10-05 DIAGNOSIS — N6489 Other specified disorders of breast: Secondary | ICD-10-CM | POA: Diagnosis present

## 2022-10-05 DIAGNOSIS — R928 Other abnormal and inconclusive findings on diagnostic imaging of breast: Secondary | ICD-10-CM

## 2022-10-05 DIAGNOSIS — D0512 Intraductal carcinoma in situ of left breast: Secondary | ICD-10-CM | POA: Insufficient documentation

## 2022-10-05 HISTORY — PX: BREAST BIOPSY: SHX20

## 2022-10-05 MED ORDER — LIDOCAINE-EPINEPHRINE 1 %-1:100000 IJ SOLN
20.0000 mL | Freq: Once | INTRAMUSCULAR | Status: AC
  Administered 2022-10-05: 20 mL
  Filled 2022-10-05: qty 20

## 2022-10-05 MED ORDER — LIDOCAINE 1 % OPTIME INJ - NO CHARGE
5.0000 mL | Freq: Once | INTRAMUSCULAR | Status: AC
  Administered 2022-10-05: 5 mL
  Filled 2022-10-05: qty 6

## 2022-10-08 ENCOUNTER — Encounter: Payer: Self-pay | Admitting: *Deleted

## 2022-10-08 DIAGNOSIS — D0512 Intraductal carcinoma in situ of left breast: Secondary | ICD-10-CM

## 2022-10-08 LAB — SURGICAL PATHOLOGY

## 2022-10-08 NOTE — Progress Notes (Signed)
Received referral for newly diagnosed breast cancer from [facility].  Navigation initiated.  She will see Dr. Cathie Hoops on Wednesday at 3:00.   [facility] surgery will let me know when they can work her in.  I will call her with that appointment.

## 2022-10-09 ENCOUNTER — Encounter: Payer: Self-pay | Admitting: *Deleted

## 2022-10-09 NOTE — Progress Notes (Signed)
She will see Dr. Maia Plan or Dr. Tonna Boehringer on 10/17 at 2:00.  Appt. Details given to her.

## 2022-10-10 ENCOUNTER — Inpatient Hospital Stay: Payer: Medicare Other

## 2022-10-10 ENCOUNTER — Encounter: Payer: Self-pay | Admitting: *Deleted

## 2022-10-10 ENCOUNTER — Inpatient Hospital Stay: Payer: Medicare Other | Attending: Oncology | Admitting: Oncology

## 2022-10-10 ENCOUNTER — Encounter: Payer: Self-pay | Admitting: Oncology

## 2022-10-10 VITALS — BP 137/82 | HR 96 | Temp 97.4°F | Resp 18 | Wt 120.2 lb

## 2022-10-10 DIAGNOSIS — Z808 Family history of malignant neoplasm of other organs or systems: Secondary | ICD-10-CM | POA: Diagnosis not present

## 2022-10-10 DIAGNOSIS — Z809 Family history of malignant neoplasm, unspecified: Secondary | ICD-10-CM | POA: Diagnosis not present

## 2022-10-10 DIAGNOSIS — Z87891 Personal history of nicotine dependence: Secondary | ICD-10-CM | POA: Diagnosis not present

## 2022-10-10 DIAGNOSIS — Z8041 Family history of malignant neoplasm of ovary: Secondary | ICD-10-CM | POA: Insufficient documentation

## 2022-10-10 DIAGNOSIS — D0512 Intraductal carcinoma in situ of left breast: Secondary | ICD-10-CM | POA: Insufficient documentation

## 2022-10-10 DIAGNOSIS — Z803 Family history of malignant neoplasm of breast: Secondary | ICD-10-CM | POA: Insufficient documentation

## 2022-10-10 DIAGNOSIS — D0510 Intraductal carcinoma in situ of unspecified breast: Secondary | ICD-10-CM | POA: Insufficient documentation

## 2022-10-10 LAB — CMP (CANCER CENTER ONLY)
ALT: 21 U/L (ref 0–44)
AST: 20 U/L (ref 15–41)
Albumin: 4.4 g/dL (ref 3.5–5.0)
Alkaline Phosphatase: 75 U/L (ref 38–126)
Anion gap: 7 (ref 5–15)
BUN: 11 mg/dL (ref 8–23)
CO2: 26 mmol/L (ref 22–32)
Calcium: 9.6 mg/dL (ref 8.9–10.3)
Chloride: 106 mmol/L (ref 98–111)
Creatinine: 0.61 mg/dL (ref 0.44–1.00)
GFR, Estimated: >60 mL/min (ref >60)
Glucose, Bld: 100 mg/dL — ABNORMAL HIGH (ref 70–99)
Potassium: 4.5 mmol/L (ref 3.5–5.1)
Sodium: 139 mmol/L (ref 135–145)
Total Bilirubin: 0.7 mg/dL (ref 0.3–1.2)
Total Protein: 7.9 g/dL (ref 6.5–8.1)

## 2022-10-10 LAB — CBC WITH DIFFERENTIAL (CANCER CENTER ONLY)
Abs Immature Granulocytes: 0.02 10*3/uL (ref 0.00–0.07)
Basophils Absolute: 0.1 10*3/uL (ref 0.0–0.1)
Basophils Relative: 1 %
Eosinophils Absolute: 0.3 10*3/uL (ref 0.0–0.5)
Eosinophils Relative: 4 %
HCT: 45.2 % (ref 36.0–46.0)
Hemoglobin: 14.6 g/dL (ref 12.0–15.0)
Immature Granulocytes: 0 %
Lymphocytes Relative: 16 %
Lymphs Abs: 1.2 10*3/uL (ref 0.7–4.0)
MCH: 30.9 pg (ref 26.0–34.0)
MCHC: 32.3 g/dL (ref 30.0–36.0)
MCV: 95.8 fL (ref 80.0–100.0)
Monocytes Absolute: 0.9 10*3/uL (ref 0.1–1.0)
Monocytes Relative: 11 %
Neutro Abs: 5.4 10*3/uL (ref 1.7–7.7)
Neutrophils Relative %: 68 %
Platelet Count: 379 10*3/uL (ref 150–400)
RBC: 4.72 MIL/uL (ref 3.87–5.11)
RDW: 12.9 % (ref 11.5–15.5)
WBC Count: 8 10*3/uL (ref 4.0–10.5)
nRBC: 0 % (ref 0.0–0.2)

## 2022-10-10 NOTE — Progress Notes (Signed)
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  We discussed that DCIS is a non invasive breast cancer,  cancer is only in the duct and has not spread into the tissues around it. High grade DCIS has 10% chance of having invasive disease on final surgical pathology.   Recommend surgery evaluation for lumpectomy.  ER staining will be obtained on surgical specimen. If ER positive, recommend adjuvant endocrinology.   Family history of cancer Recommend genetic testing. Refer to counselor.    Orders Placed This Encounter  Procedures   CMP (Cancer Center only)    Standing Status:   Future    Number of Occurrences:   1    Standing Expiration Date:   10/10/2023   CBC with Differential (Cancer Center Only)    Standing Status:   Future    Number of Occurrences:   1    Standing Expiration Date:   10/10/2023   Cancer antigen 27.29    Standing Status:   Future    Number of Occurrences:   1    Standing Expiration Date:   10/10/2023   Cancer antigen 15-3    Standing Status:   Future    Number of Occurrences:   1    Standing Expiration Date:   10/10/2023   Ambulatory referral to Genetics    Referral Priority:   Routine    Referral Type:   Consultation    Referral Reason:   Specialty Services Required    Number of Visits Requested:   1   Follow up 1-2 weeks after surgery All questions were answered. The patient knows to call the clinic with any problems, questions or concerns.  Rickard Patience, MD, PhD [facility] Hematology Oncology 10/10/2022    HISTORY OF PRESENTING ILLNESS:  Ruth Morgan 67 y.o. female presents to establish care for left breast DCIS I have reviewed her chart and  materials related to her cancer extensively and collaborated history with the patient. Summary of oncologic history is as follows: Oncology History  DCIS (ductal carcinoma in situ)  09/12/2022 Imaging   Bilateral screening mammogram  In the left breast, a possible mass with architectural distortion warrants further evaluation. In the right breast, no findings suspicious for malignancy.    09/21/2022 Mammogram   Unilateral left diagnostic mammogram and [place]  1. Developing asymmetry central, slightly medial aspect of the left breast, without associated mass or convincing distortion. Tissue sampling is recommended.   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 Breast, left, needle core biopsy, UIQ, ribbon clip :  - DUCTAL CARCINOMA IN SITU,  APOCRINE AND SECRETORY FEATURES, HIGH GRADE (3)  - NECROSIS: NOT IDENTIFIED  - CALCIFICATIONS: NOT IDENTIFIED  - DCIS LENGTH: 0.3 CM   Patient reports family history cancer.   Menarche at age of 52 or 67 yo First live birth at age of 76 OCP use: no  History of hysterectomy: yes, ovaries removed. Menopausal status: postmenopausal History of HRT use for 5 years History of chest radiation: no  Number of previous breast biopsies: no    10/10/2022 Cancer Staging   Staging form: Breast, AJCC 8th Edition - Clinical stage from 10/10/2022: Stage 0 (cTis (DCIS),  cN0, cM0) - Signed by Rickard Patience, MD on 10/10/2022 Stage prefix: Initial diagnosis     MEDICAL HISTORY:  Past Medical History:  Diagnosis Date   ADHD (attention deficit hyperactivity disorder)    a.) on amphetamine-dextroamphetamine   Angina at rest [facility])    Anxiety state, unspecified 12/05/2012   Asthma    Bilateral inguinal hernia 04/2022   CAD (coronary artery disease) 03/23/2021   a.) MPI 03/01/2021: no ischemia; b.) cCTA 03/23/2021: Ca score 1.22 (48th percentile for age/sex match control); 1-99% cor Ca in RCA distribution   Carpal tunnel syndrome     right - repaired. Left - not repaired   Chronic, continuous use of opioids    COPD (chronic obstructive pulmonary disease) (HCC)    DDD (degenerative disc disease), lumbar    by MRI   Diastolic dysfunction 03/01/2021   a.) TTE 03/01/2021: EF 50%, triv MR/PR, mild TR, mod AR, G1DD   GERD (gastroesophageal reflux disease)    Headache    History of 2019 novel coronavirus disease (COVID-19) 2021   History of bilateral cataract extraction 2016   History of shingles 2013   Hyperlipidemia    Hypertension    Orthostatic hypotension    Prediabetes 12/05/2012   Shingles 06/25/2017   Smoker 12/05/2012   Tubular adenoma of colon 2010    SURGICAL HISTORY: Past Surgical History:  Procedure Laterality Date   APPENDECTOMY  1972   BREAST BIOPSY Left    needle bx /clip neg   BREAST BIOPSY Left 10/05/2022   stereo bx, asymmetry, X clip-path pending   BREAST BIOPSY Left 10/05/2022   MM LT BREAST BX W LOC DEV 1ST LESION IMAGE BX SPEC STEREO GUIDE 10/05/2022 [facility]   BROW LIFT Bilateral 04/16/2017   Procedure: BLEPHAROPLASTY UPPER EYELID: W/EXCESS SKIN;  Surgeon: Imagene Riches, MD;  Location: [facility];  Service: Ophthalmology;  Laterality: Bilateral;   CARPAL TUNNEL RELEASE Right 2014   CATARACT EXTRACTION W/ INTRAOCULAR LENS IMPLANT Right 05/04/2014   CATARACT EXTRACTION W/ INTRAOCULAR LENS IMPLANT Left 06/09/2014   COLONOSCOPY N/A 06/02/2021   Procedure: COLONOSCOPY;  Surgeon: Jaynie Collins, DO;  Location: [facility];  Service: Gastroenterology;  Laterality: N/A;   INSERTION OF MESH Bilateral 04/27/2022   Procedure: INSERTION OF MESH;  Surgeon: Sung Amabile, DO;  Location: [facility];  Service: General;  Laterality: Bilateral;   LASIK  2006   NASAL SINUS SURGERY  2004   TOTAL ABDOMINAL HYSTERECTOMY W/ BILATERAL SALPINGOOPHORECTOMY  1985   benign ovarian tumors    SOCIAL HISTORY: Social History   Socioeconomic History   Marital status: Divorced    Spouse  name: Not on file   Number of children: Not on file   Years of education: Not on file   Highest education level: Not on file  Occupational History   Not on file  Tobacco Use   Smoking status: Former    Current packs/day: 0.00    Average packs/day: 0.8 packs/day for 46.0 years (34.5 ttl pk-yrs)    Types: Cigarettes    Start date: 12/1975    Quit date: 12/2021    Years since quitting: 0.8   Smokeless tobacco: Never   Tobacco comments:    .5 ppd currently  Vaping Use   Vaping status: Never Used  Substance and Sexual Activity   Alcohol use: Yes    Comment: rare   Drug use: No   Sexual activity: Not on file  Other Topics Concern   Not on file  Social History Narrative   Lives with boyfriend   Social Determinants of Health   Financial Resource Strain: Low Risk  (10/10/2022)   Overall Financial Resource Strain (CARDIA)    Difficulty of Paying Living Expenses: Not very hard  Food Insecurity: No Food Insecurity (10/10/2022)   Hunger Vital Sign    Worried About Running Out of Food in the Last Year: Never true    Ran Out of Food in the Last Year: Never true  Transportation Needs: No Transportation Needs (10/10/2022)   PRAPARE - Administrator, Civil Service (Medical): No    Lack of Transportation (Non-Medical): No  Physical Activity: Not on file  Stress: Not on file  Social Connections: Not on file  Intimate Partner Violence: Not At Risk (10/10/2022)   Humiliation, Afraid, Rape, and Kick questionnaire    Fear of Current or Ex-Partner: No    Emotionally Abused: No    Physically Abused: No    Sexually Abused: No    FAMILY HISTORY: Family History  Problem Relation Age of Onset   Hypertension Mother    Cancer Father        stomach   CAD Father 23       MI   Kidney failure Father        ESRD on HD   Hyperlipidemia Father    Diabetes Sister    CAD Sister        MI   Diabetes Brother    CAD Brother        MI   Cancer Paternal Uncle    Cancer Paternal  Grandmother        ovarian   Cancer Paternal Grandfather        throat   Breast cancer Niece 80    ALLERGIES:  is allergic to lisinopril.  MEDICATIONS:  Current Outpatient Medications  Medication Sig Dispense Refill   albuterol (VENTOLIN HFA) 108 (90 Base) MCG/ACT inhaler Inhale 2 puffs into the lungs every 6 (six) hours as needed for wheezing or shortness of breath. 18 g 0   amphetamine-dextroamphetamine (ADDERALL) 20 MG tablet Take 30 mg by mouth 2 (two) times daily.     Budeson-Glycopyrrol-Formoterol (BREZTRI AEROSPHERE) 160-9-4.8 MCG/ACT AERO Inhale 1 puff into the lungs daily.     Cholecalciferol (VITAMIN D-3) 25 MCG (1000 UT) CAPS Take 1 capsule by mouth daily.     gabapentin (NEURONTIN) 100 MG capsule Take 100 mg by mouth 2 (two) times daily as needed.     ibuprofen (ADVIL) 800 MG tablet Take 1 tablet (800 mg total) by mouth every 8 (eight) hours as needed for mild pain or moderate pain. 30 tablet 0   ivabradine (CORLANOR) 7.5 MG TABS tablet Take tablets (15mg ) TWO hours prior to your cardiac CT scan. 2 tablet 0   metoprolol tartrate (LOPRESSOR) 100 MG tablet Take tablet (100mg ) TWO hours prior to your cardiac CT scan. 1 tablet 0   MULTIPLE VITAMIN PO Take 1 tablet by mouth daily.     oxyCODONE (OXY IR/ROXICODONE) 5 MG immediate release tablet Take 1 tablet (5 mg total) by mouth 2 (two) times daily as needed for up to 6 doses for severe pain. 6 tablet 0   rosuvastatin (CRESTOR) 20 MG tablet Take 20 mg by mouth at bedtime.     lisinopril (ZESTRIL) 10 MG tablet Take 10 mg by mouth at bedtime. (Patient not taking: Reported on 10/10/2022)     No current facility-administered medications for this visit.  Review of Systems  Constitutional:  Negative for appetite change, chills, fatigue and fever.  HENT:   Negative for hearing loss and voice change.   Eyes:  Negative for eye problems.  Respiratory:  Negative for chest tightness and cough.   Cardiovascular:  Negative for chest pain.   Gastrointestinal:  Negative for abdominal distention, abdominal pain and blood in stool.  Endocrine: Negative for hot flashes.  Genitourinary:  Negative for difficulty urinating and frequency.   Musculoskeletal:  Negative for arthralgias.  Skin:  Negative for itching and rash.  Neurological:  Negative for extremity weakness.  Hematological:  Negative for adenopathy.  Psychiatric/Behavioral:  Negative for confusion.      PHYSICAL EXAMINATION: ECOG PERFORMANCE STATUS: 0 - Asymptomatic  Vitals:   10/10/22 1503  BP: 137/82  Pulse: 96  Resp: 18  Temp: (!) 97.4 F (36.3 C)  SpO2: 97%   Filed Weights   10/10/22 1503  Weight: 120 lb 3.2 oz (54.5 kg)    Physical Exam Constitutional:      General: She is not in acute distress.    Appearance: She is not diaphoretic.  HENT:     Head: Normocephalic and atraumatic.  Eyes:     General: No scleral icterus. Cardiovascular:     Rate and Rhythm: Normal rate and regular rhythm.     Heart sounds: No murmur heard. Pulmonary:     Effort: Pulmonary effort is normal. No respiratory distress.     Breath sounds: No wheezing.  Abdominal:     General: There is no distension.     Palpations: Abdomen is soft.     Tenderness: There is no abdominal tenderness.  Musculoskeletal:        General: Normal range of motion.     Cervical back: Normal range of motion and neck supple.  Skin:    General: Skin is warm and dry.     Findings: No erythema.  Neurological:     Mental Status: She is alert and oriented to person, place, and time. Mental status is at baseline.     Cranial Nerves: No cranial nerve deficit.     Motor: No abnormal muscle tone.  Psychiatric:        Mood and Affect: Mood and affect normal.    Breast exam was performed in seated and lying down position. Patient is status post left breast biopsy, with focal skin bruising and tissue swelling.  No palpable breast masses bilaterally.  No palpable axillary adenopathy bilaterally.   LABORATORY DATA:  I have reviewed the data as listed    Latest Ref Rng & Units 10/10/2022    3:36 PM 04/24/2022    3:01 PM 09/21/2020    3:48 PM  CBC  WBC 4.0 - 10.5 K/uL 8.0  7.1  8.4   Hemoglobin 12.0 - 15.0 g/dL 16.1  09.6  04.5   Hematocrit 36.0 - 46.0 % 45.2  43.2  44.9   Platelets 150 - 400 K/uL 379  401  404       Latest Ref Rng & Units 10/10/2022    3:36 PM 04/24/2022    3:01 PM 09/21/2020    3:48 PM  CMP  Glucose 70 - 99 mg/dL 409  811  914   BUN 8 - 23 mg/dL 11  14  14    Creatinine 0.44 - 1.00 mg/dL 7.82  9.56  2.13   Sodium 135 - 145 mmol/L 139  136  138   Potassium 3.5 - 5.1 mmol/L 4.5  4.3  4.9   Chloride 98 - 111 mmol/L 106  102  103   CO2 22 - 32 mmol/L 26  27  26    Calcium 8.9 - 10.3 mg/dL 9.6  9.2  9.6   Total Protein 6.5 - 8.1 g/dL 7.9     Total Bilirubin 0.3 - 1.2 mg/dL 0.7     Alkaline Phos 38 - 126 U/L 75     AST 15 - 41 U/L 20     ALT 0 - 44 U/L 21        RADIOGRAPHIC STUDIES: I have personally reviewed the radiological images as listed and agreed with the findings in the report. MM LT BREAST BX W LOC DEV 1ST LESION IMAGE BX SPEC STEREO GUIDE  Addendum Date: 10/08/2022   ADDENDUM REPORT: 10/08/2022 12:39 ADDENDUM: PATHOLOGY revealed: 1. Breast, LEFT, needle core biopsy, UIQ, ribbon clip : - DUCTAL CARCINOMA IN SITU, APOCRINE AND SECRETORY FEATURES, HIGH GRADE (3) Pathology results are CONCORDANT with imaging findings, per Dr. Norva Pavlov. Pathology results and recommendations were discussed with patient via telephone on 10/08/2022. Patient reported biopsy site doing well with no adverse symptoms, and only slight tenderness at the site. Post biopsy care instructions were reviewed, questions were answered and my direct phone number was provided. Patient was instructed to call [facility] for any additional questions or concerns related to biopsy site. RECOMMENDATIONS: 1. Surgical and oncological consultation. Request for surgical and oncological  consultation relayed to Irving Shows RN at [facility] by Donell Sievert, RN on Monday, October 08, 2022. Pathology results reported by Donell Sievert, RN on 10/08/2022. Electronically Signed   By: Norva Pavlov M.D.   On: 10/08/2022 12:39   Result Date: 10/08/2022 CLINICAL DATA:  Patient presents for stereotactic biopsy of asymmetry in the LEFT breast. EXAM: LEFT BREAST STEREOTACTIC CORE NEEDLE BIOPSY COMPARISON:  Previous exam(s). FINDINGS: The patient and I discussed the procedure of stereotactic-guided biopsy including benefits and alternatives. We discussed the high likelihood of a successful procedure. We discussed the risks of the procedure including infection, bleeding, tissue injury, clip migration, and inadequate sampling. Informed written consent was given. The usual time out protocol was performed immediately prior to the procedure. Using sterile technique and lidocaine and lidocaine with epinephrine as local anesthetic, under stereotactic guidance, a 9 gauge vacuum assisted device was used to perform core needle biopsy of asymmetry in the UPPER INNER QUADRANT of the LEFT breast using a craniocaudal approach. Lesion quadrant: UPPER INNER QUADRANT LEFT breast At the conclusion of the procedure, ribbon shaped tissue marker clip was deployed into the biopsy cavity. Follow-up 2-view mammogram was performed and dictated separately. IMPRESSION: Stereotactic-guided biopsy of LEFT breast asymmetry. No apparent complications. Electronically Signed: By: Norva Pavlov M.D. On: 10/05/2022 09:35   MM CLIP PLACEMENT LEFT  Result Date: 10/05/2022 CLINICAL DATA:  Status post stereotactic biopsy of asymmetry in the LEFT breast. EXAM: 3D DIAGNOSTIC LEFT MAMMOGRAM POST STEREOTACTIC BIOPSY COMPARISON:  Previous exam(s). FINDINGS: 3D Mammographic images were obtained following stereotactic guided biopsy of asymmetry in the UPPER INNER QUADRANT of the LEFT breast and placement of a ribbon shaped clip. The  biopsy marking clip is in expected position at the site of biopsy. IMPRESSION: Appropriate positioning of the ribbon shaped biopsy marking clip at the site of biopsy in the UPPER INNER QUADRANT LEFT breast asymmetry. Final Assessment: Post Procedure Mammograms for Marker Placement BI-RADS CATEGORY  28M: Post-Procedure Mammogram for Marker Placement Electronically Signed   By: Lanora Manis  Manson Passey M.D.   On: 10/05/2022 09:56   MM 3D DIAGNOSTIC MAMMOGRAM UNILATERAL LEFT BREAST  Result Date: 09/21/2022 CLINICAL DATA:  Screening recall for possible left breast mass with store shin. EXAM: DIGITAL DIAGNOSTIC UNILATERAL LEFT MAMMOGRAM WITH TOMOSYNTHESIS AND CAD; ULTRASOUND LEFT BREAST LIMITED TECHNIQUE: Left digital diagnostic mammography and breast tomosynthesis was performed. The images were evaluated with computer-aided detection. ; Targeted ultrasound examination of the left breast was performed. COMPARISON:  Previous exam(s). ACR Breast Density Category b: There are scattered areas of fibroglandular density. FINDINGS: On spot compression imaging, the possible mass and distortion mostly disperse. There is residual asymmetry that projects in the central, slightly medial left breast. A small previously evaluated cyst lies superior to this. No other masses. On physical exam, no mass is palpated in medial left breast. Targeted ultrasound is performed, showing a simple cyst at 11 o'clock, 7 cm the nipple, previously evaluated. There is a smaller cyst adjacent to this. No solid masses. No sonographic architectural distortion. Sonographic imaging of the left axilla demonstrates normal lymph nodes. No enlarged or abnormal lymph nodes. IMPRESSION: 1. Developing asymmetry central, slightly medial aspect of the left breast, without associated mass or convincing distortion. Tissue sampling is recommended. RECOMMENDATION: 1. Stereotactic core needle biopsy of the left breast. I have discussed the findings and recommendations with  the patient. If applicable, a reminder letter will be sent to the patient regarding the next appointment. BI-RADS CATEGORY  4: Suspicious. Electronically Signed   By: Amie Portland M.D.   On: 09/21/2022 11:45   [place] LIMITED ULTRASOUND INCLUDING AXILLA LEFT BREAST   Result Date: 09/21/2022 CLINICAL DATA:  Screening recall for possible left breast mass with store shin. EXAM: DIGITAL DIAGNOSTIC UNILATERAL LEFT MAMMOGRAM WITH TOMOSYNTHESIS AND CAD; ULTRASOUND LEFT BREAST LIMITED TECHNIQUE: Left digital diagnostic mammography and breast tomosynthesis was performed. The images were evaluated with computer-aided detection. ; Targeted ultrasound examination of the left breast was performed. COMPARISON:  Previous exam(s). ACR Breast Density Category b: There are scattered areas of fibroglandular density. FINDINGS: On spot compression imaging, the possible mass and distortion mostly disperse. There is residual asymmetry that projects in the central, slightly medial left breast. A small previously evaluated cyst lies superior to this. No other masses. On physical exam, no mass is palpated in medial left breast. Targeted ultrasound is performed, showing a simple cyst at 11 o'clock, 7 cm the nipple, previously evaluated. There is a smaller cyst adjacent to this. No solid masses. No sonographic architectural distortion. Sonographic imaging of the left axilla demonstrates normal lymph nodes. No enlarged or abnormal lymph nodes. IMPRESSION: 1. Developing asymmetry central, slightly medial aspect of the left breast, without associated mass or convincing distortion. Tissue sampling is recommended. RECOMMENDATION: 1. Stereotactic core needle biopsy of the left breast. I have discussed the findings and recommendations with the patient. If applicable, a reminder letter will be sent to the patient regarding the next appointment. BI-RADS CATEGORY  4: Suspicious. Electronically Signed   By: Amie Portland M.D.   On: 09/21/2022 11:45

## 2022-10-10 NOTE — Assessment & Plan Note (Addendum)
High grade left breast DCIS The DCIS diagnosis and care plan were discussed with patient in detail.  We discussed that DCIS is a non invasive breast cancer,  cancer is only in the duct and has not spread into the tissues around it. High grade DCIS has 10% chance of having invasive disease on final surgical pathology.   Recommend surgery evaluation for lumpectomy.  ER staining will be obtained on surgical specimen. If ER positive, recommend adjuvant endocrinology.

## 2022-10-10 NOTE — Assessment & Plan Note (Signed)
Recommend genetic testing. Refer to counselor.

## 2022-10-11 ENCOUNTER — Encounter: Payer: Self-pay | Admitting: Licensed Clinical Social Worker

## 2022-10-11 ENCOUNTER — Inpatient Hospital Stay: Payer: Medicare Other

## 2022-10-11 ENCOUNTER — Inpatient Hospital Stay: Payer: Medicare Other | Admitting: Licensed Clinical Social Worker

## 2022-10-11 DIAGNOSIS — Z8041 Family history of malignant neoplasm of ovary: Secondary | ICD-10-CM

## 2022-10-11 DIAGNOSIS — Z803 Family history of malignant neoplasm of breast: Secondary | ICD-10-CM

## 2022-10-11 DIAGNOSIS — D0512 Intraductal carcinoma in situ of left breast: Secondary | ICD-10-CM

## 2022-10-11 DIAGNOSIS — Z800 Family history of malignant neoplasm of digestive organs: Secondary | ICD-10-CM

## 2022-10-11 DIAGNOSIS — Z801 Family history of malignant neoplasm of trachea, bronchus and lung: Secondary | ICD-10-CM

## 2022-10-11 LAB — CANCER ANTIGEN 27.29: CA 27.29: 25.4 U/mL (ref 0.0–38.6)

## 2022-10-11 LAB — CANCER ANTIGEN 15-3: CA 15-3: 17.8 U/mL (ref 0.0–25.0)

## 2022-10-11 NOTE — Progress Notes (Signed)
REFERRING PROVIDER: Rickard Patience, MD [address] [place],  [place] [address]  PRIMARY PROVIDER:  Lorrine Kin, MD  PRIMARY REASON FOR VISIT:  1. Ductal carcinoma in situ (DCIS) of left breast   2. Family history of breast cancer   3. Family history of ovarian cancer   4. Family history of lung cancer   5. Family history of stomach cancer      HISTORY OF PRESENT ILLNESS:   Ruth Morgan, a 67 y.o. female, was seen for a [facility] cancer genetics consultation at the request of Ruth Morgan due to a personal and family history of cancer.  Ruth Morgan presents to clinic today to discuss the possibility of a hereditary predisposition to cancer, genetic testing, and to further clarify her future cancer risks, as well as potential cancer risks for family members.   In 2024, at the age of 79 Ruth Morgan was diagnosed with DCIS of the left breast. The treatment plan is still being determined. She does not think genetic testing would change her mind about a surgical decision.  CANCER HISTORY:  Oncology History  DCIS (ductal carcinoma in situ)  09/12/2022 Imaging   Bilateral screening mammogram  In the left breast, a possible mass with architectural distortion warrants further evaluation. In the right breast, no findings suspicious for malignancy.    09/21/2022 Mammogram   Unilateral left diagnostic mammogram and [place]  1. Developing asymmetry central, slightly medial aspect of the left breast, without associated mass or convincing distortion. Tissue sampling is recommended.   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 Breast, left, needle core biopsy, UIQ, ribbon clip :  - DUCTAL CARCINOMA IN SITU,  APOCRINE AND SECRETORY FEATURES, HIGH GRADE (3)  - NECROSIS: NOT IDENTIFIED  - CALCIFICATIONS: NOT IDENTIFIED  - DCIS LENGTH: 0.3 CM   Patient reports family history cancer.   Menarche at age of 4 or 67 yo First live birth at age of 58 OCP use:  no  History of hysterectomy: yes, ovaries removed. Menopausal status: postmenopausal History of HRT use for 5 years History of chest radiation: no  Number of previous breast biopsies: no    10/10/2022 Cancer Staging   Staging form: Breast, AJCC 8th Edition - Clinical stage from 10/10/2022: Stage 0 (cTis (DCIS), cN0, cM0) - Signed by Ruth Patience, MD on 10/10/2022 Stage prefix: Initial diagnosis     RISK FACTORS:  Menarche was at age 49-14.  First live birth at age 25.  Ovaries intact: no.  Hysterectomy: yes.  Menopausal status: postmenopausal.  HRT use: 5 years. Colonoscopy: yes; normal.  Past Medical History:  Diagnosis Date   ADHD (attention deficit hyperactivity disorder)    a.) on amphetamine-dextroamphetamine   Angina at rest [facility])    Anxiety state, unspecified 12/05/2012   Asthma    Bilateral inguinal hernia 04/2022   CAD (coronary artery disease) 03/23/2021   a.) MPI 03/01/2021: no ischemia; b.) cCTA 03/23/2021: Ca score 1.22 (48th percentile for age/sex match control); 1-99% cor Ca in RCA distribution   Carpal tunnel syndrome    right - repaired. Left - not repaired   Chronic, continuous use of opioids    COPD (chronic obstructive pulmonary disease) (HCC)    DDD (degenerative disc disease), lumbar    by MRI   Diastolic dysfunction 03/01/2021   a.) TTE 03/01/2021: EF 50%, triv MR/PR, mild TR, mod AR, G1DD   GERD (gastroesophageal reflux disease)    Headache  History of 2019 novel coronavirus disease (COVID-19) 2021   History of bilateral cataract extraction 2016   History of shingles 2013   Hyperlipidemia    Hypertension    Orthostatic hypotension    Prediabetes 12/05/2012   Shingles 06/25/2017   Smoker 12/05/2012   Tubular adenoma of colon 2010    Past Surgical History:  Procedure Laterality Date   APPENDECTOMY  1972   BREAST BIOPSY Left    needle bx /clip neg   BREAST BIOPSY Left 10/05/2022   stereo bx, asymmetry, X clip-path pending   BREAST BIOPSY  Left 10/05/2022   MM LT BREAST BX W LOC DEV 1ST LESION IMAGE BX SPEC STEREO GUIDE 10/05/2022 [facility]   BROW LIFT Bilateral 04/16/2017   Procedure: BLEPHAROPLASTY UPPER EYELID: W/EXCESS SKIN;  Surgeon: Imagene Riches, MD;  Location: [facility];  Service: Ophthalmology;  Laterality: Bilateral;   CARPAL TUNNEL RELEASE Right 2014   CATARACT EXTRACTION W/ INTRAOCULAR LENS IMPLANT Right 05/04/2014   CATARACT EXTRACTION W/ INTRAOCULAR LENS IMPLANT Left 06/09/2014   COLONOSCOPY N/A 06/02/2021   Procedure: COLONOSCOPY;  Surgeon: Jaynie Collins, DO;  Location: [facility];  Service: Gastroenterology;  Laterality: N/A;   INSERTION OF MESH Bilateral 04/27/2022   Procedure: INSERTION OF MESH;  Surgeon: Sung Amabile, DO;  Location: [facility] ORS;  Service: General;  Laterality: Bilateral;   LASIK  2006   NASAL SINUS SURGERY  2004   TOTAL ABDOMINAL HYSTERECTOMY W/ BILATERAL SALPINGOOPHORECTOMY  1985   benign ovarian tumors    FAMILY HISTORY:  We obtained a detailed, 4-generation family history.  Significant diagnoses are listed below: Family History  Problem Relation Age of Onset   Hypertension Mother    Stomach cancer Father        d. early 80s   CAD Father 42       MI   Kidney failure Father        ESRD on HD   Hyperlipidemia Father    Diabetes Sister    CAD Sister        MI   Diabetes Brother    CAD Brother        MI   Lung cancer Paternal Uncle    Lung cancer Paternal Uncle    Ovarian cancer Paternal Grandmother        d. 87s   Lung cancer Paternal Grandfather        d.>50   Breast cancer Niece 24   Ruth Morgan has 1 daughter, 75. She has 6 sisters and 3 brothers, none have had cancer. A niece had breast cancer at 70, unsure if she had genetic testing.  Ruth Morgan mother died at 61. No known cancers on this side of the family.  Ruth Morgan father passed in his early 81s of stomach cancer. Two paternal uncles had lung cancer. Paternal grandfather also  had lung cancer, all three had history of smoking. Paternal grandmother died of ovarian cancer in her 15s.   Ruth Morgan is unaware of previous family history of genetic testing for hereditary cancer risks. There is no reported Ashkenazi Jewish ancestry. There is no known consanguinity.    GENETIC COUNSELING ASSESSMENT: Ms. Popper is a 67 y.o. female with a personal and family history of cancer which is somewhat suggestive of a hereditary cancer syndrome and predisposition to cancer. We, therefore, discussed and recommended the following at today's visit.   DISCUSSION: We discussed that approximately 10% of breast cancer is hereditary. Most cases of hereditary breast cancer  are associated with BRCA1/2 genes, although there are other genes associated with hereditary cancer as well. Cancers and risks are gene specific. We discussed that testing is beneficial for several reasons including knowing about cancer risks, identifying potential screening and risk-reduction options that may be appropriate, and to understand if other family members could be at risk for cancer and allow them to undergo genetic testing.   We reviewed the characteristics, features and inheritance patterns of hereditary cancer syndromes. We also discussed genetic testing, including the appropriate family members to test, the process of testing, insurance coverage and turn-around-time for results. We discussed the implications of a negative, positive and/or variant of uncertain significant result. We recommended Ms. Ambrocio pursue genetic testing for the [facility] Multi-Cancer+RNA gene panel.   Based on Ms. Branscome's personal and family history of cancer, she meets medical criteria for genetic testing. Despite that she meets criteria, she may still have an out of pocket cost.   PLAN: After considering the risks, benefits, and limitations, Ms. Keator provided informed consent to pursue genetic testing and the blood sample was sent  to [facility] for analysis of the Multi-Cancer+RNA panel. Results should be available within approximately 2-3 weeks' time, at which point they will be disclosed by telephone to Ms. Mulligan, as will any additional recommendations warranted by these results. Ms. Stracener will receive a summary of her genetic counseling visit and a copy of her results once available. This information will also be available in Epic.   Ms. Sandell questions were answered to her satisfaction today. Our contact information was provided should additional questions or concerns arise. Thank you for the referral and allowing [place] to share in the care of your patient.   Ruth Duverney, MS, [facility] Genetic Counselor [email or web address] Phone: [phone]  The patient was seen for a total of 15 minutes in face-to-face genetic counseling. Dr. Blake Divine was available for discussion regarding this case.   _______________________________________________________________________ For Office Staff:  Number of people involved in session: 2 Was an Intern/ student involved with case: no

## 2022-10-13 ENCOUNTER — Telehealth: Payer: Self-pay | Admitting: Genetic Counselor

## 2022-10-13 NOTE — Telephone Encounter (Signed)
Scheduled appointment per inbasket message. Left voicemail with appointment details as well as the address.

## 2022-10-18 ENCOUNTER — Ambulatory Visit: Payer: Self-pay | Admitting: Surgery

## 2022-10-18 NOTE — H&P (Signed)
Subjective:   CC: Ductal carcinoma in situ (DCIS) of left breast [D05.12] HPI:  Ruth Morgan is a 67 y.o. female who was referred by Ruth Saa, DO for evaluation of above. Change was noted on last screening mammogram. Age of menarche was 66. Patient denies nipple discharge. Patient denies previous breast biopsy. Patient denies a personal history of breast cancer.   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  Past Surgical History:  has a past surgical history that includes photorefractive keratotomy/lasik (Bilateral, 01/02/2004); Hysterectomy Vaginal W/Removal Tubes &/Or Ovaries; Amputation finger / thumb; Functional endoscopic sinus surgery; Hysterectomy; Colonoscopy (06/02/2021); extraction cataract extracapsular w/insertion intraocular prosthesis (Right, 05/04/2014); extraction cataract extracapsular w/insertion intraocular prosthesis (Left, 06/09/2014); Eye surgery (Right, 2016); and robot assisted laparoscopic repair inguinal hernia (Bilateral, 04/27/2022).  Family History: family history includes Blindness in her sister.  Social History:  reports that she quit smoking about 2 years ago. Her smoking use included cigarettes. She started smoking about 32 years ago. She has a 30 pack-year smoking history. She has been exposed to tobacco smoke. She quit smokeless tobacco use about 2 years ago. She reports that she does not drink alcohol and does not use drugs.  Current Medications: has a current medication list which includes the following prescription(s): acetaminophen, albuterol mdi  (proventil, ventolin, proair) hfa, breztri aerosphere, cholecalciferol, dextroamphetamine-amphetamine, multivitamin, rosuvastatin, and lidocaine-prilocaine.  Allergies:  Allergies as of 10/18/2022 - Reviewed 10/18/2022  Allergen Reaction Noted   Lisinopril Cough 10/01/2022    ROS:  A 15 point review of systems was performed and was negative except as noted in HPI   Objective:     BP (!) 155/86   Pulse 101   Ht 154.9 cm (5\' 1" )   Wt 54.4 kg (120 lb)   BMI 22.67 kg/m   Constitutional :  No distress, cooperative, alert  Lymphatics/Throat:  Supple with no lymphadenopathy  Respiratory:  Clear to auscultation bilaterally  Cardiovascular:  Regular rate and rhythm  Gastrointestinal: Soft, non-tender, non-distended, no organomegaly.  Musculoskeletal: Steady gait and movement  Skin: Cool and moist  Psychiatric: Normal affect, non-agitated, not confused  Breast: Normal appearance and no palpable abnormality in right breasts and axilla. Bruising noted from biopsy on left. No palpable lump or adenopathy. Chaperone present for exam.      LABS:  Component 13 d ago  SURGICAL PATHOLOGY SURGICAL PATHOLOGY [facility] [address] [place], [place] [address] Telephone [phone] or [phone] Fax [phone]  REPORT OF SURGICAL PATHOLOGY   Accession #: [id number] Patient Name: Ruth, Morgan Visit # : [id number]  MRN: [id number] Physician: Ruth Morgan DOB/Age [birth year] (Age: 48) Gender: F Collected Date: 10/05/2022 Received Date: 10/05/2022  FINAL DIAGNOSIS       1. Breast, left, needle core biopsy, UIQ, ribbon clip :      - DUCTAL CARCINOMA IN SITU,  APOCRINE AND SECRETORY FEATURES, HIGH GRADE (3)      - NECROSIS: NOT IDENTIFIED      - CALCIFICATIONS: NOT IDENTIFIED      - DCIS LENGTH: 0.3 CM      - SEE NOTE       Diagnosis Note : Ruth Morgan reviewed the case and concurs with the  interpretation.  A breast prognostic  profile is deferred to the excision      specimen per [facility] guidelines.  Ruth Sievert, RN was notified via secure chat on      10/08/2022.      DATE SIGNED OUT: 10/08/2022 ELECTRONIC SIGNATURE : Ruth Harold M.D., Dossie Arbour., Pathologist, Electronic Signature  MICROSCOPIC DESCRIPTION  CASE COMMENTS STAINS USED IN DIAGNOSIS: H&E-2 H&E-3 H&E-4 H&E    CLINICAL HISTORY  SPECIMEN(S) OBTAINED 1. Breast, left, needle core biopsy, UIQ, Ribbon Clip  SPECIMEN COMMENTS: 1. TIF: 8:22 AM, CIT < 5 minutes; asymmetry SPECIMEN CLINICAL INFORMATION: 1. Possible PASH, FCC, IMC    Gross Description 1. "Left breast stereo bx, asymmetry-UIQ": received in formalin is a 3.0 x 1.5 x 0.3 cm aggregate of multiple fibroadipose tissue cores and fragments. The specimen is submitted in toto in 1 block (1A).      Collection time: 10/05/22 at 0822      Time in formalin: 10/05/22 at 0824      Cold ischemia time: Less than 5 minutes (per requisition)      Localization colon at ribbon clip      AMG 10/05/2022        Report signed out from the following location(s) [place] [address], [place] [address] CLIA #: [id number]  [facility] [address] [place], [place] [address] CLIA #: [id number]     RADS: CLINICAL DATA:  Screening recall for possible left breast mass with  store shin.   EXAM:  DIGITAL DIAGNOSTIC UNILATERAL LEFT MAMMOGRAM WITH TOMOSYNTHESIS AND  CAD; ULTRASOUND LEFT BREAST LIMITED   TECHNIQUE:  Left digital diagnostic mammography and breast tomosynthesis was  performed. The images were evaluated with computer-aided detection.  ; Targeted ultrasound examination of the left breast was performed.   COMPARISON:  Previous exam(s).   ACR Breast Density Category b: There are scattered areas of  fibroglandular density.   FINDINGS:  On spot compression imaging, the possible mass and distortion mostly  disperse. There is residual  asymmetry that projects in the central,  slightly medial left breast. A small previously evaluated cyst lies  superior to this. No other masses.   On physical exam, no mass is palpated in medial left breast.   Targeted ultrasound is performed, showing a simple cyst at 11  o'clock, 7 cm the nipple, previously evaluated. There is a smaller  cyst adjacent to this. No solid masses. No sonographic architectural  distortion.   Sonographic imaging of the left axilla demonstrates normal lymph  nodes. No enlarged or abnormal lymph nodes.   IMPRESSION:  1. Developing asymmetry central, slightly medial aspect of the left  breast, without associated mass or convincing distortion. Tissue  sampling is recommended.   RECOMMENDATION:  1. Stereotactic core needle biopsy of the left breast.   I have discussed the findings and recommendations with the patient.  If applicable, a reminder letter will be sent to the patient  regarding the next appointment.   BI-RADS CATEGORY  4: Suspicious.    Electronically Signed    By: Amie Portland M.D.    On: 09/21/2022 11:45   Addendum by Ruth Pavlov, MD on 10/08/2022  2:39 PM EDT  ADDENDUM REPORT: 10/08/2022 12:39   ADDENDUM:  PATHOLOGY revealed: 1. Breast, LEFT, needle core biopsy, UIQ, ribbon  clip : - DUCTAL CARCINOMA IN SITU, APOCRINE AND SECRETORY FEATURES,  HIGH GRADE (3)   Pathology results are CONCORDANT with imaging findings, per Dr.  Norva Morgan.   Pathology results and recommendations were discussed with patient  via telephone on 10/08/2022. Patient reported biopsy site doing well  with no adverse symptoms, and only slight tenderness at the site.  Post biopsy care instructions were reviewed, questions were answered  and my direct phone number was provided. Patient was instructed to  call [facility] for any additional questions or concerns  related to biopsy site.   RECOMMENDATIONS: 1. Surgical and oncological  consultation. Request  for surgical and oncological consultation relayed to Irving Shows RN at  [facility] by Ruth Sievert, RN on Monday,  October 08, 2022.   Pathology results reported by Ruth Sievert, RN on 10/08/2022.    Electronically Signed    By: Ruth Morgan M.D.    On: 10/08/2022 12:39   Assessment:   Ductal carcinoma in situ (DCIS) of left breast [D05.12]  Plan:     1. Ductal carcinoma in situ (DCIS) of left breast [D05.12]  Discussed the risk of surgery including recurrence, chronic pain, post-op infxn, poor/delayed wound healing, poor cosmesis, seroma, hematoma formation, and possible re-operation to address said risks. The risks of general anesthetic, if used, includes MI, CVA, sudden death or even reaction to anesthetic medications also discussed.  Typical post-op recovery time and possbility of activity restrictions were also discussed.  Alternatives include continued observation.  Benefits include possible symptom relief, pathologic evaluation, and/or curative excision.   The patient verbalized understanding and all questions were answered to the patient's satisfaction.  2. Patient has elected to proceed with surgical treatment. Procedure will be scheduled.  LEFT, SLNB and SCOUT tag placement prior  labs/images/medications/previous chart entries reviewed personally and relevant changes/updates noted above.

## 2022-10-18 NOTE — H&P (View-Only) (Signed)
Subjective:   CC: Ductal carcinoma in situ (DCIS) of left breast [D05.12] HPI:  Ruth Morgan is a 67 y.o. female who was referred by Ruth Saa, DO for evaluation of above. Change was noted on last screening mammogram. Age of menarche was 66. Patient denies nipple discharge. Patient denies previous breast biopsy. Patient denies a personal history of breast cancer.   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  Past Surgical History:  has a past surgical history that includes photorefractive keratotomy/lasik (Bilateral, 01/02/2004); Hysterectomy Vaginal W/Removal Tubes &/Or Ovaries; Amputation finger / thumb; Functional endoscopic sinus surgery; Hysterectomy; Colonoscopy (06/02/2021); extraction cataract extracapsular w/insertion intraocular prosthesis (Right, 05/04/2014); extraction cataract extracapsular w/insertion intraocular prosthesis (Left, 06/09/2014); Eye surgery (Right, 2016); and robot assisted laparoscopic repair inguinal hernia (Bilateral, 04/27/2022).  Family History: family history includes Blindness in her sister.  Social History:  reports that she quit smoking about 2 years ago. Her smoking use included cigarettes. She started smoking about 32 years ago. She has a 30 pack-year smoking history. She has been exposed to tobacco smoke. She quit smokeless tobacco use about 2 years ago. She reports that she does not drink alcohol and does not use drugs.  Current Medications: has a current medication list which includes the following prescription(s): acetaminophen, albuterol mdi  (proventil, ventolin, proair) hfa, breztri aerosphere, cholecalciferol, dextroamphetamine-amphetamine, multivitamin, rosuvastatin, and lidocaine-prilocaine.  Allergies:  Allergies as of 10/18/2022 - Reviewed 10/18/2022  Allergen Reaction Noted   Lisinopril Cough 10/01/2022    ROS:  A 15 point review of systems was performed and was negative except as noted in HPI   Objective:     BP (!) 155/86   Pulse 101   Ht 154.9 cm (5\' 1" )   Wt 54.4 kg (120 lb)   BMI 22.67 kg/m   Constitutional :  No distress, cooperative, alert  Lymphatics/Throat:  Supple with no lymphadenopathy  Respiratory:  Clear to auscultation bilaterally  Cardiovascular:  Regular rate and rhythm  Gastrointestinal: Soft, non-tender, non-distended, no organomegaly.  Musculoskeletal: Steady gait and movement  Skin: Cool and moist  Psychiatric: Normal affect, non-agitated, not confused  Breast: Normal appearance and no palpable abnormality in right breasts and axilla. Bruising noted from biopsy on left. No palpable lump or adenopathy. Chaperone present for exam.      LABS:  Component 13 d ago  SURGICAL PATHOLOGY SURGICAL PATHOLOGY [facility] [address] [place], [place] [address] Telephone [phone] or [phone] Fax [phone]  REPORT OF SURGICAL PATHOLOGY   Accession #: [id number] Patient Name: Ruth, Morgan Visit # : [id number]  MRN: [id number] Physician: Ruth Morgan DOB/Age [birth year] (Age: 48) Gender: F Collected Date: 10/05/2022 Received Date: 10/05/2022  FINAL DIAGNOSIS       1. Breast, left, needle core biopsy, UIQ, ribbon clip :      - DUCTAL CARCINOMA IN SITU,  APOCRINE AND SECRETORY FEATURES, HIGH GRADE (3)      - NECROSIS: NOT IDENTIFIED      - CALCIFICATIONS: NOT IDENTIFIED      - DCIS LENGTH: 0.3 CM      - SEE NOTE       Diagnosis Note : Ruth Morgan reviewed the case and concurs with the  interpretation.  A breast prognostic  profile is deferred to the excision      specimen per [facility] guidelines.  Ruth Sievert, RN was notified via secure chat on      10/08/2022.      DATE SIGNED OUT: 10/08/2022 ELECTRONIC SIGNATURE : Ruth Morgan M.D., Ruth Morgan., Pathologist, Electronic Signature  MICROSCOPIC DESCRIPTION  CASE COMMENTS STAINS USED IN DIAGNOSIS: H&E-2 H&E-3 H&E-4 H&E    CLINICAL HISTORY  SPECIMEN(S) OBTAINED 1. Breast, left, needle core biopsy, UIQ, Ribbon Clip  SPECIMEN COMMENTS: 1. TIF: 8:22 AM, CIT < 5 minutes; asymmetry SPECIMEN CLINICAL INFORMATION: 1. Possible PASH, FCC, IMC    Gross Description 1. "Left breast stereo bx, asymmetry-UIQ": received in formalin is a 3.0 x 1.5 x 0.3 cm aggregate of multiple fibroadipose tissue cores and fragments. The specimen is submitted in toto in 1 block (1A).      Collection time: 10/05/22 at 0822      Time in formalin: 10/05/22 at 0824      Cold ischemia time: Less than 5 minutes (per requisition)      Localization colon at ribbon clip      AMG 10/05/2022        Report signed out from the following location(s) [place] [address], [place] [address] CLIA #: [id number]  [facility] [address] [place], [place] [address] CLIA #: [id number]     RADS: CLINICAL DATA:  Screening recall for possible left breast mass with  store shin.   EXAM:  DIGITAL DIAGNOSTIC UNILATERAL LEFT MAMMOGRAM WITH TOMOSYNTHESIS AND  CAD; ULTRASOUND LEFT BREAST LIMITED   TECHNIQUE:  Left digital diagnostic mammography and breast tomosynthesis was  performed. The images were evaluated with computer-aided detection.  ; Targeted ultrasound examination of the left breast was performed.   COMPARISON:  Previous exam(s).   ACR Breast Density Category b: There are scattered areas of  fibroglandular density.   FINDINGS:  On spot compression imaging, the possible mass and distortion mostly  disperse. There is residual  asymmetry that projects in the central,  slightly medial left breast. A small previously evaluated cyst lies  superior to this. No other masses.   On physical exam, no mass is palpated in medial left breast.   Targeted ultrasound is performed, showing a simple cyst at 11  o'clock, 7 cm the nipple, previously evaluated. There is a smaller  cyst adjacent to this. No solid masses. No sonographic architectural  distortion.   Sonographic imaging of the left axilla demonstrates normal lymph  nodes. No enlarged or abnormal lymph nodes.   IMPRESSION:  1. Developing asymmetry central, slightly medial aspect of the left  breast, without associated mass or convincing distortion. Tissue  sampling is recommended.   RECOMMENDATION:  1. Stereotactic core needle biopsy of the left breast.   I have discussed the findings and recommendations with the patient.  If applicable, a reminder letter will be sent to the patient  regarding the next appointment.   BI-RADS CATEGORY  4: Suspicious.    Electronically Signed    By: Amie Portland M.D.    On: 09/21/2022 11:45   Addendum by Ruth Pavlov, MD on 10/08/2022  2:39 PM EDT  ADDENDUM REPORT: 10/08/2022 12:39   ADDENDUM:  PATHOLOGY revealed: 1. Breast, LEFT, needle core biopsy, UIQ, ribbon  clip : - DUCTAL CARCINOMA IN SITU, APOCRINE AND SECRETORY FEATURES,  HIGH GRADE (3)   Pathology results are CONCORDANT with imaging findings, per Dr.  Norva Morgan.   Pathology results and recommendations were discussed with patient  via telephone on 10/08/2022. Patient reported biopsy site doing well  with no adverse symptoms, and only slight tenderness at the site.  Post biopsy care instructions were reviewed, questions were answered  and my direct phone number was provided. Patient was instructed to  call [facility] for any additional questions or concerns  related to biopsy site.   RECOMMENDATIONS: 1. Surgical and oncological  consultation. Request  for surgical and oncological consultation relayed to Irving Shows RN at  [facility] by Ruth Sievert, RN on Monday,  October 08, 2022.   Pathology results reported by Ruth Sievert, RN on 10/08/2022.    Electronically Signed    By: Ruth Morgan M.D.    On: 10/08/2022 12:39   Assessment:   Ductal carcinoma in situ (DCIS) of left breast [D05.12]  Plan:     1. Ductal carcinoma in situ (DCIS) of left breast [D05.12]  Discussed the risk of surgery including recurrence, chronic pain, post-op infxn, poor/delayed wound healing, poor cosmesis, seroma, hematoma formation, and possible re-operation to address said risks. The risks of general anesthetic, if used, includes MI, CVA, sudden death or even reaction to anesthetic medications also discussed.  Typical post-op recovery time and possbility of activity restrictions were also discussed.  Alternatives include continued observation.  Benefits include possible symptom relief, pathologic evaluation, and/or curative excision.   The patient verbalized understanding and all questions were answered to the patient's satisfaction.  2. Patient has elected to proceed with surgical treatment. Procedure will be scheduled.  LEFT, SLNB and SCOUT tag placement prior  labs/images/medications/previous chart entries reviewed personally and relevant changes/updates noted above.

## 2022-10-19 ENCOUNTER — Encounter: Payer: Self-pay | Admitting: *Deleted

## 2022-10-19 DIAGNOSIS — D0512 Intraductal carcinoma in situ of left breast: Secondary | ICD-10-CM

## 2022-10-19 NOTE — Progress Notes (Signed)
Lumpectomy is scheduled for 10/31.  She will see Dr. Cathie Hoops and Dr. Rushie Chestnut back on 11/19.  VM left asking for return call to confirm appointments.

## 2022-10-22 ENCOUNTER — Encounter: Payer: Self-pay | Admitting: *Deleted

## 2022-10-22 ENCOUNTER — Encounter: Payer: Self-pay | Admitting: Licensed Clinical Social Worker

## 2022-10-22 ENCOUNTER — Ambulatory Visit: Payer: Self-pay | Admitting: Licensed Clinical Social Worker

## 2022-10-22 ENCOUNTER — Other Ambulatory Visit: Payer: Self-pay | Admitting: Surgery

## 2022-10-22 ENCOUNTER — Telehealth: Payer: Self-pay | Admitting: Licensed Clinical Social Worker

## 2022-10-22 DIAGNOSIS — D0512 Intraductal carcinoma in situ of left breast: Secondary | ICD-10-CM

## 2022-10-22 DIAGNOSIS — Z1379 Encounter for other screening for genetic and chromosomal anomalies: Secondary | ICD-10-CM | POA: Insufficient documentation

## 2022-10-22 DIAGNOSIS — Z9189 Other specified personal risk factors, not elsewhere classified: Secondary | ICD-10-CM

## 2022-10-22 NOTE — Progress Notes (Signed)
HPI:   [name] was previously seen in the [facility] due to a personal and family history of cancer and concerns regarding a hereditary predisposition to cancer. Please refer to our prior cancer genetics clinic note for more information regarding our discussion, assessment and recommendations, at the time. [name] recent genetic test results were disclosed to her, as were recommendations warranted by these results. These results and recommendations are discussed in more detail below.  CANCER HISTORY:  Oncology History  DCIS (ductal carcinoma in situ)  09/12/2022 Imaging   Bilateral screening mammogram  In the left breast, a possible mass with architectural distortion warrants further evaluation. In the right breast, no findings suspicious for malignancy.    09/21/2022 Mammogram   Unilateral left diagnostic mammogram and [place]  1. Developing asymmetry central, slightly medial aspect of the left breast, without associated mass or convincing distortion. Tissue sampling is recommended.   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 Breast, left, needle core biopsy, UIQ, ribbon clip :  - DUCTAL CARCINOMA IN SITU,  APOCRINE AND SECRETORY FEATURES, HIGH GRADE (3)  - NECROSIS: NOT IDENTIFIED  - CALCIFICATIONS: NOT IDENTIFIED  - DCIS LENGTH: 0.3 CM   Patient reports family history cancer.   Menarche at age of 62 or 67 yo First live birth at age of 31 OCP use: no  History of hysterectomy: yes, ovaries removed. Menopausal status: postmenopausal History of HRT use for 5 years History of chest radiation: no  Number of previous breast biopsies: no    10/10/2022 Cancer Staging   Staging form: Breast, AJCC 8th Edition - Clinical stage from 10/10/2022: Stage 0 (cTis (DCIS), cN0, cM0) - Signed by Rickard Patience, MD on 10/10/2022 Stage prefix: Initial diagnosis    Genetic Testing   No pathogenic variants identified on the [facility]  Multi-Cancer+RNA panel. VUS in MSH6 called [id number] (p.Lys374del)  identified. The report date is 10/20/2022.  The Multi-Cancer + RNA Panel offered by [facility] includes sequencing and/or deletion/duplication analysis of the following 70 genes:  AIP*, ALK, APC*, ATM*, AXIN2*, BAP1*, BARD1*, BLM*, BMPR1A*, BRCA1*, BRCA2*, BRIP1*, CDC73*, CDH1*, CDK4, CDKN1B*, CDKN2A, CHEK2*, CTNNA1*, DICER1*, EPCAM, EGFR, FH*, FLCN*, GREM1, HOXB13, KIT, LZTR1, MAX*, MBD4, MEN1*, MET, MITF, MLH1*, MSH2*, MSH3*, MSH6*, MUTYH*, NF1*, NF2*, NTHL1*, PALB2*, PDGFRA, PMS2*, POLD1*, POLE*, POT1*, PRKAR1A*, PTCH1*, PTEN*, RAD51C*, RAD51D*, RB1*, RET, SDHA*, SDHAF2*, SDHB*, SDHC*, SDHD*, SMAD4*, SMARCA4*, SMARCB1*, SMARCE1*, STK11*, SUFU*, TMEM127*, TP53*, TSC1*, TSC2*, VHL*. RNA analysis is performed for * genes.     FAMILY HISTORY:  We obtained a detailed, 4-generation family history.  Significant diagnoses are listed below: Family History  Problem Relation Age of Onset   Hypertension Mother    Stomach cancer Father        d. early 59s   CAD Father 75       MI   Kidney failure Father        ESRD on HD   Hyperlipidemia Father    Diabetes Sister    CAD Sister        MI   Diabetes Brother    CAD Brother        MI   Lung cancer Paternal Uncle    Lung cancer Paternal Uncle    Ovarian cancer Paternal Grandmother        d. 36s   Lung cancer Paternal Grandfather        d.>50   Breast cancer Niece 41   [name]  has 1 daughter, 3. She has 6 sisters and 3 brothers, none have had cancer. A niece had breast cancer at 73, unsure if she had genetic testing.   [name] mother died at 43. No known cancers on this side of the family.   [name] father passed in his early 64s of stomach cancer. Two paternal uncles had lung cancer. Paternal grandfather also had lung cancer, all three had history of smoking. Paternal grandmother died of ovarian cancer in her 7s.    [name] is unaware of previous  family history of genetic testing for hereditary cancer risks. There is no reported Ashkenazi Jewish ancestry. There is no known consanguinity.      GENETIC TEST RESULTS:  The [facility] Multi-Cancer+RNA panel Panel found no pathogenic mutations.   The Multi-Cancer + RNA Panel offered by [facility] includes sequencing and/or deletion/duplication analysis of the following 70 genes:  AIP*, ALK, APC*, ATM*, AXIN2*, BAP1*, BARD1*, BLM*, BMPR1A*, BRCA1*, BRCA2*, BRIP1*, CDC73*, CDH1*, CDK4, CDKN1B*, CDKN2A, CHEK2*, CTNNA1*, DICER1*, EPCAM, EGFR, FH*, FLCN*, GREM1, HOXB13, KIT, LZTR1, MAX*, MBD4, MEN1*, MET, MITF, MLH1*, MSH2*, MSH3*, MSH6*, MUTYH*, NF1*, NF2*, NTHL1*, PALB2*, PDGFRA, PMS2*, POLD1*, POLE*, POT1*, PRKAR1A*, PTCH1*, PTEN*, RAD51C*, RAD51D*, RB1*, RET, SDHA*, SDHAF2*, SDHB*, SDHC*, SDHD*, SMAD4*, SMARCA4*, SMARCB1*, SMARCE1*, STK11*, SUFU*, TMEM127*, TP53*, TSC1*, TSC2*, VHL*. RNA analysis is performed for * genes.   The test report has been scanned into EPIC and is located under the Molecular Pathology section of the Results Review tab.  A portion of the result report is included below for reference. Genetic testing reported out on 10/20/2022.     Genetic testing identified a variant of uncertain significance (VUS) in the MSH6 gene.  At this time, it is unknown if this variant is associated with an increased risk for cancer or if it is benign, but most uncertain variants are reclassified to benign. It should not be used to make medical management decisions. With time, we suspect the laboratory will determine the significance of this variant, if any. If the laboratory reclassifies this variant, we will attempt to contact [name] to discuss it further.   Even though a pathogenic variant was not identified, possible explanations for the cancer in the family may include: There may be no hereditary risk for cancer in the family. The cancers in [name] and/or her family may be  sporadic/familial or due to other genetic and environmental factors. There may be a gene mutation in one of these genes that current testing methods cannot detect but that chance is small. There could be another gene that has not yet been discovered, or that we have not yet tested, that is responsible for the cancer diagnoses in the family.  It is also possible there is a hereditary cause for the cancer in the family that [name] did not inherit  Therefore, it is important to remain in touch with cancer genetics in the future so that we can continue to offer [name] the most up to date genetic testing.   ADDITIONAL GENETIC TESTING:  We discussed with [name] that her genetic testing was fairly extensive.  If there are additional relevant genes identified to increase cancer risk that can be analyzed in the future, we would be happy to discuss and coordinate this testing at that time.    CANCER SCREENING RECOMMENDATIONS:  [name] test result is considered negative (normal).  This means that we have not identified a hereditary cause for her personal and family history of  cancer at this time.   An individual's cancer risk and medical management are not determined by genetic test results alone. Overall cancer risk assessment incorporates additional factors, including personal medical history, family history, and any available genetic information that may result in a personalized plan for cancer prevention and surveillance. Therefore, it is recommended she continue to follow the cancer management and screening guidelines provided by her oncology and primary healthcare provider.  RECOMMENDATIONS FOR FAMILY MEMBERS:   Since she did not inherit a identifiable mutation in a cancer predisposition gene included on this panel, her children could not have inherited a known mutation from her in one of these genes. Individuals in this family might be at some increased risk of developing  cancer, over the general population risk, due to the family history of cancer.  Individuals in the family should notify their providers of the family history of cancer. We recommend women in this family have a yearly mammogram beginning at age 49, or 10 years younger than the earliest onset of cancer, an annual clinical breast exam, and perform monthly breast self-exams.  Family members should have colonoscopies by at age 51, or earlier, as recommended by their providers. Other members of the family may still carry a pathogenic variant in one of these genes that Ms. Kwiatek did not inherit. Based on the family history, we recommend her niece who had breast cancer at 38 and any paternal relatives have genetic counseling and testing. [name] will let us know if we can be of any assistance in coordinating genetic counseling and/or testing for this family member.   We do not recommend familial testing for the MSH6 variant of uncertain significance (VUS).  FOLLOW-UP:  Lastly, we discussed with [name] that cancer genetics is a rapidly advancing field and it is possible that new genetic tests will be appropriate for her and/or her family members in the future. We encouraged her to remain in contact with cancer genetics on an annual basis so we can update her personal and family histories and let her know of advances in cancer genetics that may benefit this family.   Our contact number was provided. [name] questions were answered to her satisfaction, and she knows she is welcome to call us at anytime with additional questions or concerns.    Lacy Duverney, MS, [facility] Genetic Counselor [email or web address] Phone: [phone]

## 2022-10-22 NOTE — Progress Notes (Signed)
Spoke with Ruth Morgan about pre surgery [facility] eval.  She is scheduled for 10/30.

## 2022-10-22 NOTE — Telephone Encounter (Signed)
I contacted Ruth Morgan to discuss her genetic testing results. No pathogenic variants were identified in the 70 genes analyzed. Detailed clinic note to follow.   The test report has been scanned into EPIC and is located under the Molecular Pathology section of the Results Review tab.  A portion of the result report is included below for reference.      Ruth Duverney, MS, [facility] Genetic Counselor [email or web address] Phone: [phone]

## 2022-10-23 ENCOUNTER — Other Ambulatory Visit: Payer: Self-pay

## 2022-10-23 ENCOUNTER — Encounter
Admission: RE | Admit: 2022-10-23 | Discharge: 2022-10-23 | Disposition: A | Discharge status: Home / Self Care | Payer: Medicare Other | Source: Ambulatory Visit | Attending: Surgery | Admitting: Surgery

## 2022-10-23 NOTE — Patient Instructions (Addendum)
Your procedure is scheduled on: Thursday 11/01/22 Arrive as instructed at 7:30 am    Report to the Registration Desk on the 1st floor of the [facility] Free Valet parking is available.  If your arrival time is 6:00 am, do not arrive before that time as the [facility] entrance doors do not open until 6:00 am.  REMEMBER: Instructions that are not followed completely may result in serious medical risk, up to and including death; or upon the discretion of your surgeon and anesthesiologist your surgery may need to be rescheduled.  Do not eat food after midnight the night before surgery.  No gum chewing or hard candies.  You may however, drink CLEAR liquids up to 2 hours before you are scheduled to arrive for your surgery. Do not drink anything within 2 hours of your scheduled arrival time.  Clear liquids include: - water  - apple juice without pulp - gatorade (not RED colors) - black coffee or tea (Do NOT add milk or creamers to the coffee or tea) Do NOT drink anything that is not on this list.  Type 1 and Type 2 diabetics should only drink water.  One week prior to surgery: Stop Anti-inflammatories (NSAIDS) such as Advil, Aleve, Ibuprofen, Motrin, Naproxen, Naprosyn and Aspirin based products such as Excedrin, Goody's Powder, BC Powder. You may however, continue to take Tylenol if needed for pain up until the day of surgery.  Stop ANY OVER THE COUNTER supplements and vitamins for 7 days until after surgery.  Continue taking all prescribed medications with the exception of the following: Do not take your Adderall the day of your surgery.  TAKE ONLY THESE MEDICATIONS THE MORNING OF SURGERY WITH A SIP OF WATER:  none  Use inhalers (Breztri) on the day of surgery and bring to the hospital (Albuterol).  No Alcohol for 24 hours before or after surgery.  No Smoking including e-cigarettes for 24 hours before surgery.  No chewable tobacco products for at least 6 hours before surgery.   No nicotine patches on the day of surgery.  Do not use any "recreational" drugs for at least a week (preferably 2 weeks) before your surgery.  Please be advised that the combination of cocaine and anesthesia may have negative outcomes, up to and including death. If you test positive for cocaine, your surgery will be cancelled.  On the morning of surgery brush your teeth with toothpaste and water, you may rinse your mouth with mouthwash if you wish. Do not swallow any toothpaste or mouthwash.  Use CHG Soap or wipes as directed on instruction sheet.  Do not wear lotions, powders, or perfumes. No deodorant.  Do not shave body hair from the neck down 48 hours before surgery.  Wear comfortable clothing (specific to your surgery type) to the hospital.  Do not wear jewelry, make-up, hairpins, clips or nail polish.  For welded (permanent) jewelry: bracelets, anklets, waist bands, etc.  Please have this removed prior to surgery.  If it is not removed, there is a chance that hospital personnel will need to cut it off on the day of surgery. Contact lenses, hearing aids and dentures may not be worn into surgery.  Do not bring valuables to the hospital. [facility] is not responsible for any missing/lost belongings or valuables.   Notify your doctor if there is any change in your medical condition (cold, fever, infection).  If you are being discharged the day of surgery, you will not be allowed to drive home. You  will need a responsible individual to drive you home and stay with you for 24 hours after surgery.   If you are taking public transportation, you will need to have a responsible individual with you.  If you are being admitted to the hospital overnight, leave your suitcase in the car. After surgery it may be brought to your room.  In case of increased patient census, it may be necessary for you, the patient, to continue your postoperative care in the Same Day Surgery  department.  After surgery, you can help prevent lung complications by doing breathing exercises.  Take deep breaths and cough every 1-2 hours. Your doctor may order a device called an Incentive Spirometer to help you take deep breaths. When coughing or sneezing, hold a pillow firmly against your incision with both hands. This is called "splinting." Doing this helps protect your incision. It also decreases belly discomfort.  Surgery Visitation Policy:  Patients undergoing a surgery or procedure may have two family members or support persons with them as long as the person is not COVID-19 positive or experiencing its symptoms.   Inpatient Visitation:    Visiting hours are 7 a.m. to 8 p.m. Up to four visitors are allowed at one time in a patient room. The visitors may rotate out with other people during the day. One designated support person (adult) may remain overnight.  Please call the Pre-admissions Testing Dept. at [phone] if you have any questions about these instructions.     Preparing for Surgery with CHLORHEXIDINE GLUCONATE (CHG) Soap  Chlorhexidine Gluconate (CHG) Soap  o An antiseptic cleaner that kills germs and bonds with the skin to continue killing germs even after washing  o Used for showering the night before surgery and morning of surgery  Before surgery, you can play an important role by reducing the number of germs on your skin.  CHG (Chlorhexidine gluconate) soap is an antiseptic cleanser which kills germs and bonds with the skin to continue killing germs even after washing.  Please do not use if you have an allergy to CHG or antibacterial soaps. If your skin becomes reddened/irritated stop using the CHG.  1. Shower the NIGHT BEFORE SURGERY and the MORNING OF SURGERY with CHG soap.  2. If you choose to wash your hair, wash your hair first as usual with your normal shampoo.  3. After shampooing, rinse your hair and body thoroughly to remove the  shampoo.  4. Use CHG as you would any other liquid soap. You can apply CHG directly to the skin and wash gently with a scrungie or a clean washcloth.  5. Apply the CHG soap to your body only from the neck down. Do not use on open wounds or open sores. Avoid contact with your eyes, ears, mouth, and genitals (private parts). Wash face and genitals (private parts) with your normal soap.  6. Wash thoroughly, paying special attention to the area where your surgery will be performed.  7. Thoroughly rinse your body with warm water.  8. Do not shower/wash with your normal soap after using and rinsing off the CHG soap.  9. Pat yourself dry with a clean towel.  10. Wear clean pajamas to bed the night before surgery.  12. Place clean sheets on your bed the night of your first shower and do not sleep with pets.  13. Shower again with the CHG soap on the day of surgery prior to arriving at the hospital.  14. Do not apply any deodorants/lotions/powders.  15. Please wear clean clothes to the hospital.

## 2022-10-24 ENCOUNTER — Other Ambulatory Visit: Payer: Self-pay | Admitting: Surgery

## 2022-10-24 DIAGNOSIS — D0512 Intraductal carcinoma in situ of left breast: Secondary | ICD-10-CM

## 2022-10-30 ENCOUNTER — Other Ambulatory Visit: Payer: Medicare Other

## 2022-10-30 ENCOUNTER — Encounter: Payer: Medicare Other | Admitting: Licensed Clinical Social Worker

## 2022-10-30 ENCOUNTER — Ambulatory Visit
Admission: RE | Admit: 2022-10-30 | Discharge: 2022-10-30 | Disposition: A | Discharge status: Home / Self Care | Payer: Medicare Other | Source: Ambulatory Visit | Attending: Surgery | Admitting: Surgery

## 2022-10-30 DIAGNOSIS — D0512 Intraductal carcinoma in situ of left breast: Secondary | ICD-10-CM | POA: Insufficient documentation

## 2022-10-30 MED ORDER — LIDOCAINE HCL 1 % IJ SOLN
5.0000 mL | Freq: Once | INTRAMUSCULAR | Status: AC
  Administered 2022-10-30: 5 mL
  Filled 2022-10-30: qty 5

## 2022-10-31 ENCOUNTER — Inpatient Hospital Stay: Payer: Medicare Other | Admitting: Occupational Therapy

## 2022-11-01 ENCOUNTER — Encounter
Admission: RE | Admit: 2022-11-01 | Discharge: 2022-11-01 | Disposition: A | Discharge status: Home / Self Care | Payer: Medicare Other | Source: Ambulatory Visit | Attending: Surgery | Admitting: Surgery

## 2022-11-01 ENCOUNTER — Ambulatory Visit: Payer: Medicare Other | Admitting: Certified Registered Nurse Anesthetist

## 2022-11-01 ENCOUNTER — Ambulatory Visit
Admission: RE | Admit: 2022-11-01 | Discharge: 2022-11-01 | Disposition: A | Discharge status: Home / Self Care | Payer: Medicare Other | Attending: Surgery | Admitting: Surgery

## 2022-11-01 ENCOUNTER — Encounter: Payer: Self-pay | Admitting: Surgery

## 2022-11-01 ENCOUNTER — Other Ambulatory Visit: Payer: Self-pay

## 2022-11-01 ENCOUNTER — Encounter: Payer: Medicare Other | Admitting: Genetic Counselor

## 2022-11-01 ENCOUNTER — Encounter
Admission: RE | Disposition: A | Discharge status: Home / Self Care | Payer: Self-pay | Source: Home / Self Care | Attending: Surgery

## 2022-11-01 ENCOUNTER — Ambulatory Visit
Admission: RE | Admit: 2022-11-01 | Discharge: 2022-11-01 | Disposition: A | Discharge status: Home / Self Care | Payer: Medicare Other | Source: Ambulatory Visit | Attending: Surgery | Admitting: Surgery

## 2022-11-01 ENCOUNTER — Other Ambulatory Visit: Payer: Medicare Other

## 2022-11-01 DIAGNOSIS — E785 Hyperlipidemia, unspecified: Secondary | ICD-10-CM | POA: Insufficient documentation

## 2022-11-01 DIAGNOSIS — Z90722 Acquired absence of ovaries, bilateral: Secondary | ICD-10-CM | POA: Diagnosis not present

## 2022-11-01 DIAGNOSIS — Z8616 Personal history of COVID-19: Secondary | ICD-10-CM | POA: Diagnosis not present

## 2022-11-01 DIAGNOSIS — D0512 Intraductal carcinoma in situ of left breast: Secondary | ICD-10-CM

## 2022-11-01 DIAGNOSIS — Z7722 Contact with and (suspected) exposure to environmental tobacco smoke (acute) (chronic): Secondary | ICD-10-CM | POA: Insufficient documentation

## 2022-11-01 DIAGNOSIS — Z8619 Personal history of other infectious and parasitic diseases: Secondary | ICD-10-CM | POA: Diagnosis not present

## 2022-11-01 DIAGNOSIS — Z90710 Acquired absence of both cervix and uterus: Secondary | ICD-10-CM | POA: Diagnosis not present

## 2022-11-01 DIAGNOSIS — Z87891 Personal history of nicotine dependence: Secondary | ICD-10-CM | POA: Diagnosis not present

## 2022-11-01 DIAGNOSIS — K219 Gastro-esophageal reflux disease without esophagitis: Secondary | ICD-10-CM | POA: Diagnosis not present

## 2022-11-01 DIAGNOSIS — I10 Essential (primary) hypertension: Secondary | ICD-10-CM | POA: Insufficient documentation

## 2022-11-01 DIAGNOSIS — Z89019 Acquired absence of unspecified thumb: Secondary | ICD-10-CM | POA: Insufficient documentation

## 2022-11-01 DIAGNOSIS — J449 Chronic obstructive pulmonary disease, unspecified: Secondary | ICD-10-CM | POA: Diagnosis not present

## 2022-11-01 DIAGNOSIS — R7303 Prediabetes: Secondary | ICD-10-CM | POA: Diagnosis not present

## 2022-11-01 HISTORY — PX: PART MASTECTOMY,RADIO FREQUENCY LOCALIZER,AXILLARY SENTINEL NODE BIOPSY: SHX6901

## 2022-11-01 SURGERY — PART MASTECTOMY,RADIO FREQUENCY LOCALIZER,AXILLARY SENTINEL NODE BIOPSY
Anesthesia: General | Laterality: Left

## 2022-11-01 MED ORDER — OXYCODONE HCL 5 MG PO TABS
5.0000 mg | ORAL_TABLET | Freq: Once | ORAL | Status: DC | PRN
Start: 2022-11-01 — End: 2022-11-01

## 2022-11-01 MED ORDER — CHLORHEXIDINE GLUCONATE CLOTH 2 % EX PADS: 6.0000 | MEDICATED_PAD | Freq: Once | CUTANEOUS | Status: DC

## 2022-11-01 MED ORDER — CEFAZOLIN SODIUM-DEXTROSE 2-4 GM/100ML-% IV SOLN
INTRAVENOUS | Status: AC
  Filled 2022-11-01: qty 100

## 2022-11-01 MED ORDER — MIDAZOLAM HCL 2 MG/2ML IJ SOLN
INTRAMUSCULAR | Status: DC | PRN
  Administered 2022-11-01: 2 mg via INTRAVENOUS

## 2022-11-01 MED ORDER — ACETAMINOPHEN 10 MG/ML IV SOLN
INTRAVENOUS | Status: DC | PRN
  Administered 2022-11-01: 1000 mg via INTRAVENOUS

## 2022-11-01 MED ORDER — ORAL CARE MOUTH RINSE: 15.0000 mL | Freq: Once | OROMUCOSAL | Status: AC

## 2022-11-01 MED ORDER — PROPOFOL 1000 MG/100ML IV EMUL
INTRAVENOUS | Status: AC
  Filled 2022-11-01: qty 100

## 2022-11-01 MED ORDER — ACETAMINOPHEN 325 MG PO TABS: 650.0000 mg | ORAL_TABLET | Freq: Three times a day (TID) | ORAL | 0 refills | Status: AC | PRN

## 2022-11-01 MED ORDER — MIDAZOLAM HCL 2 MG/2ML IJ SOLN
INTRAMUSCULAR | Status: AC
  Filled 2022-11-01: qty 2

## 2022-11-01 MED ORDER — BUPIVACAINE-EPINEPHRINE 0.5% -1:200000 IJ SOLN
INTRAMUSCULAR | Status: DC | PRN
  Administered 2022-11-01: 10 mL

## 2022-11-01 MED ORDER — TECHNETIUM TC 99M TILMANOCEPT KIT
1.0000 | PACK | Freq: Once | INTRAVENOUS | Status: AC
  Administered 2022-11-01: 0.975 via INTRADERMAL

## 2022-11-01 MED ORDER — LIDOCAINE HCL (PF) 1 % IJ SOLN
INTRAMUSCULAR | Status: AC
  Filled 2022-11-01: qty 30

## 2022-11-01 MED ORDER — IBUPROFEN 400 MG PO TABS: 400.0000 mg | ORAL_TABLET | Freq: Three times a day (TID) | ORAL | 0 refills | Status: DC | PRN

## 2022-11-01 MED ORDER — FENTANYL CITRATE (PF) 100 MCG/2ML IJ SOLN
INTRAMUSCULAR | Status: AC
  Filled 2022-11-01: qty 2

## 2022-11-01 MED ORDER — PROPOFOL 10 MG/ML IV BOLUS
INTRAVENOUS | Status: DC | PRN
  Administered 2022-11-01: 100 mg via INTRAVENOUS

## 2022-11-01 MED ORDER — BUPIVACAINE HCL (PF) 0.5 % IJ SOLN
INTRAMUSCULAR | Status: AC
  Filled 2022-11-01: qty 30

## 2022-11-01 MED ORDER — CHLORHEXIDINE GLUCONATE 0.12 % MT SOLN
15.0000 mL | Freq: Once | OROMUCOSAL | Status: AC
  Administered 2022-11-01: 15 mL via OROMUCOSAL

## 2022-11-01 MED ORDER — LIDOCAINE HCL (PF) 2 % IJ SOLN
INTRAMUSCULAR | Status: AC
  Filled 2022-11-01: qty 5

## 2022-11-01 MED ORDER — ACETAMINOPHEN 10 MG/ML IV SOLN: 1000.0000 mg | Freq: Once | INTRAVENOUS | Status: DC | PRN

## 2022-11-01 MED ORDER — PROPOFOL 10 MG/ML IV BOLUS
INTRAVENOUS | Status: AC
  Filled 2022-11-01: qty 20

## 2022-11-01 MED ORDER — HYDROCODONE-ACETAMINOPHEN 5-325 MG PO TABS: 1.0000 | ORAL_TABLET | Freq: Four times a day (QID) | ORAL | 0 refills | Status: DC | PRN

## 2022-11-01 MED ORDER — PROPOFOL 500 MG/50ML IV EMUL
INTRAVENOUS | Status: DC | PRN
  Administered 2022-11-01: 150 ug/kg/min via INTRAVENOUS

## 2022-11-01 MED ORDER — CHLORHEXIDINE GLUCONATE 0.12 % MT SOLN
OROMUCOSAL | Status: AC
  Filled 2022-11-01: qty 15

## 2022-11-01 MED ORDER — OXYCODONE HCL 5 MG/5ML PO SOLN: 5.0000 mg | Freq: Once | ORAL | Status: DC | PRN

## 2022-11-01 MED ORDER — FENTANYL CITRATE (PF) 100 MCG/2ML IJ SOLN
25.0000 ug | INTRAMUSCULAR | Status: DC | PRN
Start: 2022-11-01 — End: 2022-11-01

## 2022-11-01 MED ORDER — FENTANYL CITRATE (PF) 100 MCG/2ML IJ SOLN
INTRAMUSCULAR | Status: DC | PRN
  Administered 2022-11-01 (×2): 25 ug via INTRAVENOUS

## 2022-11-01 MED ORDER — LACTATED RINGERS IV SOLN: INTRAVENOUS | Status: DC

## 2022-11-01 MED ORDER — DEXAMETHASONE SODIUM PHOSPHATE 10 MG/ML IJ SOLN
INTRAMUSCULAR | Status: DC | PRN
  Administered 2022-11-01: 10 mg via INTRAVENOUS

## 2022-11-01 MED ORDER — LIDOCAINE HCL (CARDIAC) PF 100 MG/5ML IV SOSY
PREFILLED_SYRINGE | INTRAVENOUS | Status: DC | PRN
  Administered 2022-11-01: 80 mg via INTRAVENOUS

## 2022-11-01 MED ORDER — LIDOCAINE HCL 1 % IJ SOLN
INTRAMUSCULAR | Status: DC | PRN
  Administered 2022-11-01: 10 mL

## 2022-11-01 MED ORDER — ACETAMINOPHEN 10 MG/ML IV SOLN
INTRAVENOUS | Status: AC
  Filled 2022-11-01: qty 100

## 2022-11-01 MED ORDER — DROPERIDOL 2.5 MG/ML IJ SOLN: 0.6250 mg | Freq: Once | INTRAMUSCULAR | Status: DC | PRN

## 2022-11-01 MED ORDER — CEFAZOLIN SODIUM-DEXTROSE 2-4 GM/100ML-% IV SOLN
2.0000 g | INTRAVENOUS | Status: AC
  Administered 2022-11-01: 2 g via INTRAVENOUS

## 2022-11-01 MED ORDER — ONDANSETRON HCL 4 MG/2ML IJ SOLN
INTRAMUSCULAR | Status: DC | PRN
  Administered 2022-11-01: 4 mg via INTRAVENOUS

## 2022-11-01 MED ORDER — ONDANSETRON HCL 4 MG/2ML IJ SOLN
INTRAMUSCULAR | Status: AC
  Filled 2022-11-01: qty 2

## 2022-11-01 MED ORDER — EPINEPHRINE PF 1 MG/ML IJ SOLN
INTRAMUSCULAR | Status: AC
  Filled 2022-11-01: qty 1

## 2022-11-01 MED ORDER — DOCUSATE SODIUM 100 MG PO CAPS: 100.0000 mg | ORAL_CAPSULE | Freq: Two times a day (BID) | ORAL | 0 refills | Status: AC | PRN

## 2022-11-01 SURGICAL SUPPLY — 43 items
ADH SKN CLS APL DERMABOND .7 (GAUZE/BANDAGES/DRESSINGS) ×1
APL PRP STRL LF DISP 70% ISPRP (MISCELLANEOUS) ×1
BLADE PHOTON ILLUMINATED (MISCELLANEOUS) ×1 IMPLANT
BLADE SURG 15 STRL LF DISP TIS (BLADE) ×1 IMPLANT
BLADE SURG 15 STRL SS (BLADE) ×1
CHLORAPREP W/TINT 26 (MISCELLANEOUS) ×1 IMPLANT
CNTNR URN SCR LID CUP LEK RST (MISCELLANEOUS) IMPLANT
CONT SPEC 4OZ STRL OR WHT (MISCELLANEOUS) ×1
COVER PROBE GAMMA FINDER SLV (MISCELLANEOUS) IMPLANT
DERMABOND ADVANCED .7 DNX12 (GAUZE/BANDAGES/DRESSINGS) ×1 IMPLANT
DEVICE DUBIN SPECIMEN MAMMOGRA (MISCELLANEOUS) ×1 IMPLANT
DRAPE LAPAROTOMY 77X122 PED (DRAPES) ×1 IMPLANT
ELECT CAUTERY BLADE 6.4 (BLADE) IMPLANT
ELECT REM PT RETURN 9FT ADLT (ELECTROSURGICAL) ×1
ELECTRODE REM PT RTRN 9FT ADLT (ELECTROSURGICAL) ×1 IMPLANT
GAUZE 4X4 16PLY ~~LOC~~+RFID DBL (SPONGE) IMPLANT
GLOVE BIOGEL PI IND STRL 7.0 (GLOVE) ×1 IMPLANT
GLOVE SURG SYN 6.5 ES PF (GLOVE) ×2 IMPLANT
GLOVE SURG SYN 6.5 PF PI (GLOVE) ×1 IMPLANT
GOWN STRL REUS W/ TWL LRG LVL3 (GOWN DISPOSABLE) ×3 IMPLANT
GOWN STRL REUS W/TWL LRG LVL3 (GOWN DISPOSABLE) ×2
KIT MARKER MARGIN INK (KITS) ×1 IMPLANT
KIT TURNOVER KIT A (KITS) ×1 IMPLANT
LABEL OR SOLS (LABEL) ×1 IMPLANT
LIGHT WAVEGUIDE WIDE FLAT (MISCELLANEOUS) IMPLANT
MANIFOLD NEPTUNE II (INSTRUMENTS) ×1 IMPLANT
MARKER MARGIN CORRECT CLIP (MARKER) ×1 IMPLANT
NDL HYPO 22X1.5 SAFETY MO (MISCELLANEOUS) ×1 IMPLANT
NEEDLE HYPO 22X1.5 SAFETY MO (MISCELLANEOUS) ×1 IMPLANT
PACK BASIN MINOR ARMC (MISCELLANEOUS) ×1 IMPLANT
SHEATH BREAST BIOPSY SKIN MKR (SHEATH) ×1 IMPLANT
SUT MNCRL 4-0 (SUTURE) ×2
SUT MNCRL 4-0 27XMFL (SUTURE) ×2
SUT SILK 3 0 12 30 (SUTURE) IMPLANT
SUT VIC AB 3-0 SH 27 (SUTURE) ×2
SUT VIC AB 3-0 SH 27X BRD (SUTURE) ×1 IMPLANT
SUTURE MNCRL 4-0 27XMF (SUTURE) ×1 IMPLANT
SYR 20ML LL LF (SYRINGE) ×1 IMPLANT
SYR BULB IRRIG 60ML STRL (SYRINGE) ×1 IMPLANT
TRAP FLUID SMOKE EVACUATOR (MISCELLANEOUS) ×1 IMPLANT
TRAP NEPTUNE SPECIMEN COLLECT (MISCELLANEOUS) ×1 IMPLANT
WATER STERILE IRR 1000ML POUR (IV SOLUTION) ×1 IMPLANT
WATER STERILE IRR 500ML POUR (IV SOLUTION) ×1 IMPLANT

## 2022-11-01 NOTE — Transfer of Care (Signed)
Immediate Anesthesia Transfer of Care Note  Patient: Ruth Morgan  Procedure(s) Performed: PART MASTECTOMY,RADIO FREQUENCY LOCALIZER,AXILLARY SENTINEL NODE BIOPSY (Left)  Patient Location: PACU  Anesthesia Type:General  Level of Consciousness: drowsy  Airway & Oxygen Therapy: Patient Spontanous Breathing and Patient connected to face mask oxygen  Post-op Assessment: Report given to RN and Post -op Vital signs reviewed and stable  Post vital signs: Reviewed and stable  Last Vitals:  Vitals Value Taken Time  BP 123/76 11/01/22 1035  Temp    Pulse 75 11/01/22 1037  Resp 12 11/01/22 1037  SpO2 100 % 11/01/22 1037  Vitals shown include unfiled device data.  Last Pain:  Vitals:   11/01/22 0902  TempSrc: Temporal  PainSc:          Complications: No notable events documented.

## 2022-11-01 NOTE — Anesthesia Procedure Notes (Signed)
Procedure Name: LMA Insertion Date/Time: 11/01/2022 9:24 AM  Performed by: Hezzie Bump, CRNAPre-anesthesia Checklist: Patient identified, Patient being monitored, Timeout performed, Emergency Drugs available and Suction available Patient Re-evaluated:Patient Re-evaluated prior to induction Oxygen Delivery Method: Circle system utilized Preoxygenation: Pre-oxygenation with 100% oxygen Induction Type: IV induction Ventilation: Mask ventilation without difficulty LMA: LMA inserted and LMA with gastric port inserted LMA Size: 3.0 Tube type: Oral Number of attempts: 1 Placement Confirmation: positive ETCO2 and breath sounds checked- equal and bilateral Tube secured with: Tape Dental Injury: Teeth and Oropharynx as per pre-operative assessment

## 2022-11-01 NOTE — Interval H&P Note (Signed)
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  The various methods of treatment have been discussed with the patient and family. After consideration of risks, benefits and other options for treatment, the patient has consented to  Procedure(s): PART MASTECTOMY,RADIO FREQUENCY LOCALIZER,AXILLARY SENTINEL NODE BIOPSY (Left) as a surgical intervention.  The patient's history has been reviewed, patient examined, no change in status, stable for surgery.  I have reviewed the patient's chart and labs.  Questions were answered to the patient's satisfaction.     Amardeep Beckers Tonna Boehringer

## 2022-11-01 NOTE — Anesthesia Preprocedure Evaluation (Addendum)
Anesthesia Evaluation  Patient identified by MRN, date of birth, ID band Patient awake    Reviewed: Allergy & Precautions, NPO status , Patient's Chart, lab work & pertinent test results  History of Anesthesia Complications Negative for: history of anesthetic complications  Airway Mallampati: III  TM Distance: >3 FB Neck ROM: full    Dental  (+) Teeth Intact, Caps   Pulmonary neg sleep apnea, COPD,  COPD inhaler, Patient abstained from smoking.Not current smoker, former smoker   Pulmonary exam normal breath sounds clear to auscultation       Cardiovascular Exercise Tolerance: Good METShypertension, (-) angina + CAD  (-) Past MI Normal cardiovascular exam(-) dysrhythmias  Rhythm:Regular Rate:Normal - Systolic murmurs TTE 03/01/2021: EF 50%, triv MR/PR, mild TR, mod AR, G1DD   Neuro/Psych  Headaches PSYCHIATRIC DISORDERS Anxiety        GI/Hepatic Neg liver ROS,GERD  Controlled,,  Endo/Other  neg diabetes  prediabetes  Renal/GU negative Renal ROS  negative genitourinary   Musculoskeletal  (+) Arthritis ,    Abdominal Normal abdominal exam  (+)   Peds  Hematology negative hematology ROS (+)   Anesthesia Other Findings Past Medical History: 12/05/2012: Anxiety state, unspecified No date: Carpal tunnel syndrome     Comment:  right - repaired. Left - not repaired 2010: Colon polyp     Comment:  at [facility] No date: COPD (chronic obstructive pulmonary disease) (HCC) No date: DDD (degenerative disc disease), lumbar     Comment:  by MRI No date: Headache 2013: History of shingles No date: Hyperglycemia 12/05/2012: Prediabetes 12/05/2012: Smoker  Past Surgical History: No date: ABDOMINAL HYSTERECTOMY 1972: APPENDECTOMY No date: BREAST BIOPSY; Left     Comment:  needle bx /clip neg 04/16/2017: BROW LIFT; Bilateral     Comment:  Procedure: BLEPHAROPLASTY UPPER EYELID: W/EXCESS SKIN;                Surgeon: Imagene Riches, MD;  Location: [facility];  Service: Ophthalmology;  Laterality: Bilateral; 2014: CARPAL TUNNEL RELEASE; Right 2006: LASIK 2004: NASAL SINUS SURGERY 1985: TOTAL ABDOMINAL HYSTERECTOMY W/ BILATERAL SALPINGOOPHORECTOMY     Comment:  benign ovarian tumors     Reproductive/Obstetrics negative OB ROS                              Anesthesia Physical Anesthesia Plan  ASA: 2  Anesthesia Plan: General   Post-op Pain Management: Ofirmev IV (intra-op)* and Toradol IV (intra-op)*   Induction: Intravenous  PONV Risk Score and Plan: 3 and Ondansetron, Dexamethasone and Midazolam  Airway Management Planned: LMA  Additional Equipment: None  Intra-op Plan:   Post-operative Plan: Extubation in OR  Informed Consent: I have reviewed the patients History and Physical, chart, labs and discussed the procedure including the risks, benefits and alternatives for the proposed anesthesia with the patient or authorized representative who has indicated his/her understanding and acceptance.     Dental Advisory Given  Plan Discussed with: Anesthesiologist, CRNA and Surgeon  Anesthesia Plan Comments: (Discussed risks of anesthesia with patient, including PONV, sore throat, lip/dental/eye damage. Rare risks discussed as well, such as cardiorespiratory and neurological sequelae, and allergic reactions. Discussed the role of CRNA in patient's perioperative care. Patient understands.)         Anesthesia Quick Evaluation

## 2022-11-01 NOTE — Anesthesia Postprocedure Evaluation (Signed)
Anesthesia Post Note  Patient: Ruth Morgan  Procedure(s) Performed: PART MASTECTOMY,RADIO FREQUENCY LOCALIZER,AXILLARY SENTINEL NODE BIOPSY (Left)  Patient location during evaluation: PACU Anesthesia Type: General Level of consciousness: awake and alert Pain management: pain level controlled Vital Signs Assessment: post-procedure vital signs reviewed and stable Respiratory status: spontaneous breathing, nonlabored ventilation and respiratory function stable Cardiovascular status: blood pressure returned to baseline and stable Postop Assessment: no apparent nausea or vomiting Anesthetic complications: no   No notable events documented.   Last Vitals:  Vitals:   11/01/22 1100 11/01/22 1119  BP: (!) 148/79 (!) 155/88  Pulse: 77 93  Resp: 13 16  Temp: 36.6 C (!) 36.1 C  SpO2: 95% 95%    Last Pain:  Vitals:   11/01/22 1119  TempSrc: Temporal  PainSc: 0-No pain                 Foye Deer

## 2022-11-01 NOTE — Discharge Instructions (Addendum)
Removal, Care After This sheet gives you information about how to care for yourself after your procedure. Your health care provider may also give you more specific instructions. If you have problems or questions, contact your health care provider. What can I expect after the procedure? After the procedure, it is common to have: Soreness. Bruising. Itching. Follow these instructions at home: site care Follow instructions from your health care provider about how to take care of your site. Make sure you: Wash your hands with soap and water before and after you change your bandage (dressing). If soap and water are not available, use hand sanitizer. Leave stitches (sutures), skin glue, or adhesive strips in place. These skin closures may need to stay in place for 2 weeks or longer. If adhesive strip edges start to loosen and curl up, you may trim the loose edges. Do not remove adhesive strips completely unless your health care provider tells you to do that. If the area bleeds or bruises, apply gentle pressure for 10 minutes. OK TO SHOWER IN 24HRS  Check your site every day for signs of infection. Check for: Redness, swelling, or pain. Fluid or blood. Warmth. Pus or a bad smell.  General instructions Rest and then return to your normal activities as told by your health care provider.  tylenol and advil as needed for discomfort.  Please alternate between the two every four hours as needed for pain.    Use narcotics, if prescribed, only when tylenol and motrin is not enough to control pain.  325-650mg every 8hrs to max of 3000mg/24hrs (including the 325mg in every norco dose) for the tylenol.    Advil up to 800mg per dose every 8hrs as needed for pain.   Keep all follow-up visits as told by your health care provider. This is important. Contact a health care provider if: You have redness, swelling, or pain around your site. You have fluid or blood coming from your site. Your site feels warm to  the touch. You have pus or a bad smell coming from your site. You have a fever. Your sutures, skin glue, or adhesive strips loosen or come off sooner than expected. Get help right away if: You have bleeding that does not stop with pressure or a dressing. Summary After the procedure, it is common to have some soreness, bruising, and itching at the site. Follow instructions from your health care provider about how to take care of your site. Check your site every day for signs of infection. Contact a health care provider if you have redness, swelling, or pain around your site, or your site feels warm to the touch. Keep all follow-up visits as told by your health care provider. This is important. This information is not intended to replace advice given to you by your health care provider. Make sure you discuss any questions you have with your health care provider. Document Released: 01/14/2015 Document Revised: 06/17/2017 Document Reviewed: 06/17/2017 Elsevier Interactive Patient Education  2019 [employer]  AMBULATORY SURGERY  DISCHARGE INSTRUCTIONS   The drugs that you were given will stay in your system until tomorrow so for the next 24 hours you should not:  Drive an automobile Make any legal decisions Drink any alcoholic beverage   You may resume regular meals tomorrow.  Today it is better to start with liquids and gradually work up to solid foods.  You may eat anything you prefer, but it is better to start with liquids, then soup and crackers, and gradually   work up to [employer].   Please notify your doctor immediately if you have any unusual bleeding, trouble breathing, redness and pain at the surgery site, drainage, fever, or pain not relieved by medication.    Your post-operative visit with Dr.                                       is: Date:                        Time:    Please call to schedule your post-operative visit.  Additional Instructions:

## 2022-11-01 NOTE — Op Note (Signed)
Preoperative diagnosis: Left breast DCIS.  Postoperative diagnosis: Same.   Procedure: SCOUT tag-localized left breast partial mastectomy.                      Left axillary Sentinel Lymph node biopsy  Anesthesia: GETA  Surgeon: Dr. Sung Amabile  Wound Classification: Clean  Indications: Patient is a 67 y.o. female with a nonpalpable left breast mass noted on mammography with core biopsy demonstrating DCIS requires SCOUT localizer placement, partial mastectomy for treatment with sentinel lymph node biopsy.   Specimen: Left breast mass, Sentinel Lymph nodes x 1, left breast superior margin  Complications: None  Estimated Blood Loss: 20 mL  Findings: 1. Specimen mammography shows marker and SCOUT localizer on specimen 2. Pathology call refers gross examination of margins was negative for superior margin approximately 2 mm, so additional margins taken at superior aspect 3. No other palpable mass or lymph node identified.   Operation performed with curative intent:Yes  Tracer(s) used to identify sentinel nodes in the upfront surgery (non-neoadjuvant) setting (select all that apply):Radioactive Tracer  Tracer(s) used to identify sentinel nodes in the neoadjuvant setting (select all that apply):N/A  All nodes (colored or non-colored) present at the end of a dye-filled lymphatic channel were removed:N/A  All significantly radioactive nodes were removed:Yes  All palpable suspicious nodes were removed:N/A  Biopsy-proven positive nodes marked with clips prior to chemotherapy were identified and removed:N/A    Description of procedure: SCOUT localization was performed by radiology prior to procedure. In the nuclear medicine suite, the subareolar region was injected with Tc-99 sulfur colloid the morning of procedure. Localization studies were reviewed. The patient was taken to the operating room and placed supine on the operating table, and after general anesthesia the left breast and  axilla were prepped and draped in the usual sterile fashion. A time-out was completed verifying correct patient, procedure, site, positioning, and implant(s) and/or special equipment prior to beginning this procedure.  By identifying the SCOUT localizer, the probable trajectory and location of the mass was visualized. A skin incision was planned in such a way as to minimize the amount of dissection to reach the mass.  The skin incision was made after infusion of local. Flaps were raised and  Sharp and blunt dissection was then taken down to the mass, taking care to include the entire SCOUT localizer and a margin of grossly normal tissue. The specimen was removed. The specimen was oriented with paint. Imaging reviewed and the entire target lesion had been resected, with biopsy clip and localizer within the specimen.Gross margin analysis by pathology confirmed all margins cleared on initial inspection with superior margin approximately 2 mm.  Decision made to proceed with removal of superior margin to ensure adequate resection.  Superior margin once removed was also marked and then passed off field sent to pathology.  A hand-held gamma probe was used to identify the location of the hottest spot in the axilla. An incision was made around the caudal axillary hairline. Sharp and blunt Dissection was carried down to subdermal facias. The probe was placed within wound and again, the point of maximal count was found. Dissection continue until nodule was identified. The probe was placed in contact with the node and 5000 counts were recorded. The node was excised in its entirety.  The bed of the node measured 100-300 counts. No additional hot spots were identified. No clinically abnormal nodes were palpated. Both wounds irrigated, hemostasis was achieved and the wound closed in layers  with  interrupted sutures of 3-0 Vicryl in deep dermal layer and a running subcuticular suture of Monocryl 4-0, then dressed with  dermabond. The patient tolerated the procedure well and was taken to the postanesthesia care unit in stable condition. Sponge and instrument count correct at end of procedure.

## 2022-11-01 NOTE — Interval H&P Note (Signed)
No change. OK to proceed.

## 2022-11-05 ENCOUNTER — Other Ambulatory Visit: Payer: Self-pay | Admitting: Pathology

## 2022-11-05 LAB — SURGICAL PATHOLOGY

## 2022-11-06 ENCOUNTER — Encounter: Payer: Self-pay | Admitting: *Deleted

## 2022-11-07 ENCOUNTER — Inpatient Hospital Stay: Payer: Medicare Other | Attending: Oncology

## 2022-11-07 DIAGNOSIS — D0512 Intraductal carcinoma in situ of left breast: Secondary | ICD-10-CM

## 2022-11-07 NOTE — Progress Notes (Signed)
Multidisciplinary Oncology Council Documentation  Ruth Morgan was presented by our [facility] on 11/07/2022, which included representatives from:  Palliative Care Dietitian  Physical/Occupational Therapist Nurse Navigator Genetics Social work Survivorship RN Financial Navigator Research RN   Ruth Morgan currently presents with history of breast cancer  We reviewed previous medical and familial history, history of present illness, and recent lab results along with all available histopathologic and imaging studies. The MOC considered available treatment options and made the following recommendations/referrals:  SW  The MOC is a meeting of clinicians from various specialty areas who evaluate and discuss patients for whom a multidisciplinary approach is being considered. Final determinations in the plan of care are those of the provider(s).   Today's extended care, comprehensive team conference, Ruth Morgan was not present for the discussion and was not examined.

## 2022-11-12 ENCOUNTER — Inpatient Hospital Stay: Payer: Medicare Other

## 2022-11-12 NOTE — Progress Notes (Signed)
[facility] Clinical Social Work  Initial Assessment   Ruth Morgan is a 67 y.o. year old female contacted by phone. Clinical Social Work was referred by  [facility]  for assessment of psychosocial needs.   SDOH (Social Determinants of Health) assessments performed: Yes   SDOH Screenings   Food Insecurity: No Food Insecurity (10/10/2022)  Housing: Low Risk  (10/10/2022)  Transportation Needs: No Transportation Needs (10/10/2022)  Utilities: Not At Risk (10/10/2022)  Depression (PHQ2-9): Low Risk  (10/10/2022)  Financial Resource Strain: Low Risk  (10/10/2022)  Tobacco Use: Medium Risk (11/01/2022)  Health Literacy: Adequate Health Literacy (10/10/2022)     Distress Screen completed: No     No data to display            Family/Social Information:  Housing Arrangement: patient lives alone Family members/support persons in your life? 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 yes Concerns about diagnosis and/or treatment: Overwhelmed by information Patient reported stressors: Anxiety/ nervousness Hopes and/or priorities: Family Patient enjoys time with family/ friends Current coping skills/ strengths: Active sense of humor , Capable of independent living , Manufacturing systems engineer , Contractor , General fund of knowledge , Motivation for treatment/growth , and Supportive family/friends     SUMMARY: Current SDOH Barriers:  Mental Health Concerns   Clinical Social Work Clinical Goal(s):  No clinical social work goals at this time  Interventions: Discussed common feeling and emotions when being diagnosed with cancer, and the importance of  support during treatment Informed patient of the support team roles and support services at [facility] Provided CSW contact information and encouraged patient to call with any questions or concerns Provided patient with information about the [employer] and will mail literature.  Discussed patient's anxiety, which she is addressing.   Follow Up Plan: Patient will contact CSW with any support or resource needs Patient verbalizes understanding of plan: Yes    Dorothey Baseman, LCSW Clinical Social Worker [facility]

## 2022-11-13 ENCOUNTER — Ambulatory Visit: Payer: Self-pay | Admitting: Surgery

## 2022-11-13 ENCOUNTER — Encounter: Payer: Self-pay | Admitting: *Deleted

## 2022-11-13 NOTE — Progress Notes (Signed)
Ms. Rocker is having a re-excision on 12/5.  Her appointments with Dr. Rushie Chestnut and Dr. Cathie Hoops have been moved to 12/18.   Appt details given to her.   The appointment with OT Marisue Humble has also been rescheduled to 1/15 per patient preference to push it out as well.

## 2022-11-13 NOTE — H&P (Signed)
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 female who is here for followup from above.  No issues.     Current Medications: has a current medication list which includes the following prescription(s): acetaminophen, breztri aerosphere, cholecalciferol, dextroamphetamine-amphetamine, multivitamin, rosuvastatin, and albuterol mdi (proventil, ventolin, proair) hfa.  Allergies:  Allergies  Allergen Reactions   Lisinopril Cough    ROS: General: Denies weight loss, weight gain, fatigue, fevers, chills, and night sweats. Heart: Denies chest pain, palpitations, racing heart, irregular heartbeat, leg pain or swelling, and decreased activity tolerance. Respiratory: Denies breathing difficulty, shortness of breath, wheezing, cough, and sputum. GI: Denies change in appetite, heartburn, nausea, vomiting, constipation, diarrhea, and blood in stool. GU: Denies difficulty urinating, pain with urinating, urgency, frequency, blood in urine    Objective:     BP 121/74   Pulse 89   Ht 154.9 cm (5' 0.98")   Wt 54.4 kg (119 lb 14.9 oz)   BMI 22.67 kg/m   Constitutional :  Alert, no distress, cooperative  Gastrointestinal: soft, non-tender; bowel sounds normal; no masses,  no organomegaly.   Musculoskeletal: Steady gait and movement  Skin: Cool and moist, incision clean, dry, intact.  No erythema, induration or drainage to indicate infection.    Psychiatric: Normal affect, non-agitated, not confused       LABS:  SURGICAL PATHOLOGY SURGICAL PATHOLOGY [facility] [address], [address] [place], [place] [address] Telephone [phone] or [phone] Fax [phone]  REPORT OF SURGICAL PATHOLOGY   Accession #: [id number] Patient Name: Ruth Morgan, Ruth Morgan Visit # : [id number]  MRN: [id number] Physician: Lynnea Ferrier Aug 13, 1955 (Age: 58) Gender: F Collected Date:  11/01/2022 Received Date: 11/01/2022  FINAL DIAGNOSIS       1. Breast, lumpectomy, left mass :      - DUCTAL CARCINOMA IN SITU (DCIS).      - SEE CANCER SUMMARY AND NOTE BELOW.      - PRIOR BIOPSY SITE CHANGE WITH ASSOCIATED CLIP.      - SCOUT TAG PRESENT.       2. Lymph node, sentinel, biopsy, Left axillary #1 :      - ONE LYMPH NODE NEGATIVE FOR MALIGNANCY (0/1).       3. Breast, lumpectomy, left superior margin :      - RESIDUAL DUCTAL CARCINOMA IN SITU (DCIS); NEW SUPERIOR MARGIN IS NEGATIVE.       Diagnosis Note : In the main lumpectomy specimen (part 1) cauterized DCIS      extends to the superior and lateral margins.The size of DCIS is estimated to      measure at least 49 mm since the specimen measured 50 mm medial to lateral, the      lateral margin is positive, and DCIS is 1 mm from the medial margin.      In the re-excised superior margin specimen (part 3) residual DCIS, measuring at      least 5 mm, is present along the inferior aspect of the specimen corresponding      to the superior margin of the main lumpectomy specimen.      The staging summary reflects the final margin status and incorporates the      findings from the prior biopsy as well as the current three specimens.      ELECTRONIC SIGNATURE : Rubinas Md, Delice Bison , Sports administrator, Electronic Signature  MICROSCOPIC DESCRIPTION 1. CANCER  CASE SUMMARY: DUCTAL CARCINOMA IN SITU OF THE BREAST Standard(s): AJCC-UICC 8  SPECIMEN Procedure: Lumpectomy Specimen Laterality: Left  TUMOR Histologic Type: Ductal carcinoma in situ Size (Extent) of DCIS:  Estimated size (extent) of DCIS is at least 49 mm Nuclear Grade: Grade 3 (high) Necrosis: Present, central (expansive "comedo" necrosis)  MARGINS Margin Status: DCIS is present at margin: Lateral, unifocal DCIS is within 1 mm to anterior margin DCIS is 1 mm from medial and inferior margins  REGIONAL LYMPH NODES Regional Lymph node Status: All regional lymph  nodes negative for tumor Total Number of Lymph Nodes Examined (sentinel and non-sentinel): 1 Number of Sentinel Nodes Examined: 1  DISTANT METASTASIS Distant Site(s) Involved, if applicable (select all that apply): Not applicable  PATHOLOGIC STAGE CLASSIFICATION (pTNM, AJCC 8th Edition) TNM Descriptors: Not applicable pTis (DCIS) Regional Lymph Nodes Modifier: (sn) pN0 pM - Not applicable  SPECIAL STUDIES Breast Biomarker Testing will be performed on block 1B and reported in an addendum. (v4.4.0.0)  CASE COMMENTS STAINS USED IN DIAGNOSIS: H&E H&E H&E H&E H&E H&E H&E H&E *RECUT 1 SLIDE H&E H&E H&E H&E H&E H&E H&E H&E H&E Stains used in diagnosis 1 ER-ACIS Estrogen receptor (6F11), immunohistochemical stains are performed on formalin fixed, paraffin embedded tissue using a 3,3"-diaminobenzidine (DAB) chromogen and Leica Bond Autostainer System.  The staining intensity of the nucleus is scored manually and is reported as the percentage of tumor cell nuclei demonstrating specific nuclear staining.Specimens are fixed in 10% Neutral Buffered Formalin for at least 6 hours and up to 72 hours.  These tests have not be validated on decalcified tissue.  Results should be interpreted with caution given the possibility of false negative results on decalcified specimens.  ADDENDUM 1B) Breast, lumpectomy, left mass PROGNOSTIC INDICATORS  Results: IMMUNOHISTOCHEMICAL AND MORPHOMETRIC ANALYSIS PERFORMED MANUALLY Estrogen Receptor:  0%, NEGATIVE  COMMENT:  The negative hormone receptor study(ies) in this case has an internal positive control.  REFERENCE RANGE ESTROGEN RECEPTOR NEGATIVE     0% POSITIVE       =>1% REFERENCE RANGE PROGESTERONE RECEPTOR NEGATIVE     0% POSITIVE        =>1% All controls stained appropriately Picklesimer Md, Fred , Sports administrator, Electronic Signature ( Signed [phone])        Intraoperative Diagnosis: Part 1, for gross margin  evaluation:       Intraoperative Diagnosis: Fibrous area with tag identified. 2.0 mm to superior margin, 5.0 mm to      posterior, 4.0 mm to anterior margin.       Intraoperative Diagnosis: Called to Dr. Tonna Boehringer at 10:14 a.m. on 11/01/22 by Dr. Corey Harold.  CLINICAL HISTORY  SPECIMEN(S) OBTAINED 1. Breast, lumpectomy, Left Mass 2. Lymph node, sentinel, biopsy, Left Axillary #1 3. Breast, lumpectomy, Left Superior Margin  SPECIMEN COMMENTS: SPECIMEN CLINICAL INFORMATION: 1. DCIS, left breast    Gross Description 1. Specimen type: Left breast lumpectomy, labeled left breast mass".      Size: 5.0 (M-L) x 4.3 (S-I) x 4.0 (A-P) cm      Orientation: The specimen is received previously oriented by the surgeon as      follows: Anterior = green, inferior = blue, lateral = orange, medial = yellow,      posterior = black, superior = red.      Localized area: An RFID localizing tag and ribbon biopsy clip are removed from      the specimen.      Cut surface: Surrounding the localizing tagging and  biopsy clip is a 1.5 x 1.2 x      1.0 cm ill-defined area of slightly firm and gritty fibrous tissue. The lesional      area has indistinct borders is continuous with adjacent normal fibrous tissue.      The remaining cut surface consists of 80% soft and lobulated adipose with 20%      loose, pink-white fibrous tissue.      Margins: 0.1 cm from superior, 0.2 cm from anterior, 0.7 cm from lateral,  1.0      cm from inferior, 1.2 cm from posterior,  2.2 cm from medial.      Prognostic indicators: To be obtained from paraffin block as necessary.      Block summary:      1A: Representative medial margin, perpendicular      1B: Representative parenchyma medial to lesion      1C-74F: Lesion submitted entirely from medial to lateral      74F: Clip and RFID site      1G-1H: Entire lateral margin, perpendicular      Flexion time: 9:52 a.m. on 11/01/22      Time in formalin: 10:15 a.m. on 11/01/22 2. Received  fresh and placed in formalin, labeled "left axillary sentinel lymph node", is a 4.0 x 2.5 x 0.5 cm fragment of soft, lobulated adipose and lymphoid tissue.One 1.5 x 1.0 x 0.6 cm lymph node is identified, serially sectioned, and submitted entirely in blocks 2A-2B. 3. Specimen type: Received fresh placed in formalin, labeled "left breast superior margin".      Size: 4.2 (A-P) x 3.7 (M-L) x 1.7 (S-I) cm      Orientation: The specimen is received previously oriented by the surgeon as      follows: Anterior = green, inferior = blue, lateral = orange, medial = yellow,      posterior = black, superior = red.      Localized area: None.      Cut surface: There is a 1.4 x 0.7 x 0.5 cm ill-defined lesion with a central      hemorrhagic cavity. The surrounding solid lesion is slightly firm with chalky      fat necrosis and gray fibrous tissue. The remaining parenchyma consists of 95%      lobulated adipose and 5% white fibrous tissue.      Margins: < 0.1 cm from lateral, 0.2 cm from superior, 0.3 cm from inferior, 0.4      cm from posterior, 1.4 cm from medial, and 1.7 cm from anterior.      Prognostic indicators: To be obtained from paraffin block as necessary.      Block summary:      3A: Representative closest posterior margin, perpendicular      3B-3D: Lesion, submitted entirely from posterior to anterior      3E: Parenchyma immediately anterior to lesion      61F: Representative uninvolved parenchyma      3G: Representative anterior margin, perpendicular      Collection time: 10:17 a.m. on 11/01/22      Time in formalin: 10:29 a.m. on 11/01/22      SMB      11/01/2022        Report signed out from the following location(s) [place] [address], [place] [address] CLIA #: [id number]  [facility] [address] [place], [place] [address] CLIA #: [id number]    RADS: N/A  Assessment:  Ductal carcinoma in situ (DCIS) of left  breast [D05.12] S/p excision  Plan:    Recommend excision of positive margins as noted on path report.  1. Discussed the risk of surgery including recurrence, chronic pain, post-op infxn, poor/delayed wound healing, poor cosmesis, seroma, hematoma formation, and possible re-operation to address said risks. The risks of general anesthetic, if used, includes MI, CVA, sudden death or even reaction to anesthetic medications also discussed.  Typical post-op recovery time and possbility of activity restrictions were also discussed.  Alternatives include continued observation.  Benefits include possible symptom relief, pathologic evaluation, and/or curative excision.   The patient verbalized understanding and all questions were answered to the patient's satisfaction. Will schedule.  labs/images/medications/previous chart entries reviewed personally and relevant changes/updates noted above.

## 2022-11-13 NOTE — H&P (View-Only) (Signed)
 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 female who is here for followup from above.  No issues.     Current Medications: has a current medication list which includes the following prescription(s): acetaminophen, breztri aerosphere, cholecalciferol, dextroamphetamine-amphetamine, multivitamin, rosuvastatin, and albuterol mdi (proventil, ventolin, proair) hfa.  Allergies:  Allergies  Allergen Reactions   Lisinopril Cough    ROS: General: Denies weight loss, weight gain, fatigue, fevers, chills, and night sweats. Heart: Denies chest pain, palpitations, racing heart, irregular heartbeat, leg pain or swelling, and decreased activity tolerance. Respiratory: Denies breathing difficulty, shortness of breath, wheezing, cough, and sputum. GI: Denies change in appetite, heartburn, nausea, vomiting, constipation, diarrhea, and blood in stool. GU: Denies difficulty urinating, pain with urinating, urgency, frequency, blood in urine    Objective:     BP 121/74   Pulse 89   Ht 154.9 cm (5' 0.98")   Wt 54.4 kg (119 lb 14.9 oz)   BMI 22.67 kg/m   Constitutional :  Alert, no distress, cooperative  Gastrointestinal: soft, non-tender; bowel sounds normal; no masses,  no organomegaly.   Musculoskeletal: Steady gait and movement  Skin: Cool and moist, incision clean, dry, intact.  No erythema, induration or drainage to indicate infection.    Psychiatric: Normal affect, non-agitated, not confused       LABS:  SURGICAL PATHOLOGY SURGICAL PATHOLOGY [facility] [address], [address] [place], [place] [address] Telephone [phone] or [phone] Fax [phone]  REPORT OF SURGICAL PATHOLOGY   Accession #: [id number] Patient Name: Ruth Morgan, Ruth Morgan Visit # : [id number]  MRN: [id number] Physician: Lynnea Ferrier Aug 13, 1955 (Age: 58) Gender: F Collected Date:  11/01/2022 Received Date: 11/01/2022  FINAL DIAGNOSIS       1. Breast, lumpectomy, left mass :      - DUCTAL CARCINOMA IN SITU (DCIS).      - SEE CANCER SUMMARY AND NOTE BELOW.      - PRIOR BIOPSY SITE CHANGE WITH ASSOCIATED CLIP.      - SCOUT TAG PRESENT.       2. Lymph node, sentinel, biopsy, Left axillary #1 :      - ONE LYMPH NODE NEGATIVE FOR MALIGNANCY (0/1).       3. Breast, lumpectomy, left superior margin :      - RESIDUAL DUCTAL CARCINOMA IN SITU (DCIS); NEW SUPERIOR MARGIN IS NEGATIVE.       Diagnosis Note : In the main lumpectomy specimen (part 1) cauterized DCIS      extends to the superior and lateral margins.The size of DCIS is estimated to      measure at least 49 mm since the specimen measured 50 mm medial to lateral, the      lateral margin is positive, and DCIS is 1 mm from the medial margin.      In the re-excised superior margin specimen (part 3) residual DCIS, measuring at      least 5 mm, is present along the inferior aspect of the specimen corresponding      to the superior margin of the main lumpectomy specimen.      The staging summary reflects the final margin status and incorporates the      findings from the prior biopsy as well as the current three specimens.      ELECTRONIC SIGNATURE : Rubinas Md, Delice Bison , Sports administrator, Electronic Signature  MICROSCOPIC DESCRIPTION 1. CANCER  CASE SUMMARY: DUCTAL CARCINOMA IN SITU OF THE BREAST Standard(s): AJCC-UICC 8  SPECIMEN Procedure: Lumpectomy Specimen Laterality: Left  TUMOR Histologic Type: Ductal carcinoma in situ Size (Extent) of DCIS:  Estimated size (extent) of DCIS is at least 49 mm Nuclear Grade: Grade 3 (high) Necrosis: Present, central (expansive "comedo" necrosis)  MARGINS Margin Status: DCIS is present at margin: Lateral, unifocal DCIS is within 1 mm to anterior margin DCIS is 1 mm from medial and inferior margins  REGIONAL LYMPH NODES Regional Lymph node Status: All regional lymph  nodes negative for tumor Total Number of Lymph Nodes Examined (sentinel and non-sentinel): 1 Number of Sentinel Nodes Examined: 1  DISTANT METASTASIS Distant Site(s) Involved, if applicable (select all that apply): Not applicable  PATHOLOGIC STAGE CLASSIFICATION (pTNM, AJCC 8th Edition) TNM Descriptors: Not applicable pTis (DCIS) Regional Lymph Nodes Modifier: (sn) pN0 pM - Not applicable  SPECIAL STUDIES Breast Biomarker Testing will be performed on block 1B and reported in an addendum. (v4.4.0.0)  CASE COMMENTS STAINS USED IN DIAGNOSIS: H&E H&E H&E H&E H&E H&E H&E H&E *RECUT 1 SLIDE H&E H&E H&E H&E H&E H&E H&E H&E H&E Stains used in diagnosis 1 ER-ACIS Estrogen receptor (6F11), immunohistochemical stains are performed on formalin fixed, paraffin embedded tissue using a 3,3"-diaminobenzidine (DAB) chromogen and Leica Bond Autostainer System.  The staining intensity of the nucleus is scored manually and is reported as the percentage of tumor cell nuclei demonstrating specific nuclear staining.Specimens are fixed in 10% Neutral Buffered Formalin for at least 6 hours and up to 72 hours.  These tests have not be validated on decalcified tissue.  Results should be interpreted with caution given the possibility of false negative results on decalcified specimens.  ADDENDUM 1B) Breast, lumpectomy, left mass PROGNOSTIC INDICATORS  Results: IMMUNOHISTOCHEMICAL AND MORPHOMETRIC ANALYSIS PERFORMED MANUALLY Estrogen Receptor:  0%, NEGATIVE  COMMENT:  The negative hormone receptor study(ies) in this case has an internal positive control.  REFERENCE RANGE ESTROGEN RECEPTOR NEGATIVE     0% POSITIVE       =>1% REFERENCE RANGE PROGESTERONE RECEPTOR NEGATIVE     0% POSITIVE        =>1% All controls stained appropriately Picklesimer Md, Fred , Sports administrator, Electronic Signature ( Signed [phone])        Intraoperative Diagnosis: Part 1, for gross margin  evaluation:       Intraoperative Diagnosis: Fibrous area with tag identified. 2.0 mm to superior margin, 5.0 mm to      posterior, 4.0 mm to anterior margin.       Intraoperative Diagnosis: Called to Dr. Tonna Boehringer at 10:14 a.m. on 11/01/22 by Dr. Corey Harold.  CLINICAL HISTORY  SPECIMEN(S) OBTAINED 1. Breast, lumpectomy, Left Mass 2. Lymph node, sentinel, biopsy, Left Axillary #1 3. Breast, lumpectomy, Left Superior Margin  SPECIMEN COMMENTS: SPECIMEN CLINICAL INFORMATION: 1. DCIS, left breast    Gross Description 1. Specimen type: Left breast lumpectomy, labeled left breast mass".      Size: 5.0 (M-L) x 4.3 (S-I) x 4.0 (A-P) cm      Orientation: The specimen is received previously oriented by the surgeon as      follows: Anterior = green, inferior = blue, lateral = orange, medial = yellow,      posterior = black, superior = red.      Localized area: An RFID localizing tag and ribbon biopsy clip are removed from      the specimen.      Cut surface: Surrounding the localizing tagging and  biopsy clip is a 1.5 x 1.2 x      1.0 cm ill-defined area of slightly firm and gritty fibrous tissue. The lesional      area has indistinct borders is continuous with adjacent normal fibrous tissue.      The remaining cut surface consists of 80% soft and lobulated adipose with 20%      loose, pink-white fibrous tissue.      Margins: 0.1 cm from superior, 0.2 cm from anterior, 0.7 cm from lateral,  1.0      cm from inferior, 1.2 cm from posterior,  2.2 cm from medial.      Prognostic indicators: To be obtained from paraffin block as necessary.      Block summary:      1A: Representative medial margin, perpendicular      1B: Representative parenchyma medial to lesion      1C-74F: Lesion submitted entirely from medial to lateral      74F: Clip and RFID site      1G-1H: Entire lateral margin, perpendicular      Flexion time: 9:52 a.m. on 11/01/22      Time in formalin: 10:15 a.m. on 11/01/22 2. Received  fresh and placed in formalin, labeled "left axillary sentinel lymph node", is a 4.0 x 2.5 x 0.5 cm fragment of soft, lobulated adipose and lymphoid tissue.One 1.5 x 1.0 x 0.6 cm lymph node is identified, serially sectioned, and submitted entirely in blocks 2A-2B. 3. Specimen type: Received fresh placed in formalin, labeled "left breast superior margin".      Size: 4.2 (A-P) x 3.7 (M-L) x 1.7 (S-I) cm      Orientation: The specimen is received previously oriented by the surgeon as      follows: Anterior = green, inferior = blue, lateral = orange, medial = yellow,      posterior = black, superior = red.      Localized area: None.      Cut surface: There is a 1.4 x 0.7 x 0.5 cm ill-defined lesion with a central      hemorrhagic cavity. The surrounding solid lesion is slightly firm with chalky      fat necrosis and gray fibrous tissue. The remaining parenchyma consists of 95%      lobulated adipose and 5% white fibrous tissue.      Margins: < 0.1 cm from lateral, 0.2 cm from superior, 0.3 cm from inferior, 0.4      cm from posterior, 1.4 cm from medial, and 1.7 cm from anterior.      Prognostic indicators: To be obtained from paraffin block as necessary.      Block summary:      3A: Representative closest posterior margin, perpendicular      3B-3D: Lesion, submitted entirely from posterior to anterior      3E: Parenchyma immediately anterior to lesion      61F: Representative uninvolved parenchyma      3G: Representative anterior margin, perpendicular      Collection time: 10:17 a.m. on 11/01/22      Time in formalin: 10:29 a.m. on 11/01/22      SMB      11/01/2022        Report signed out from the following location(s) [place] [address], [place] [address] CLIA #: [id number]  [facility] [address] [place], [place] [address] CLIA #: [id number]    RADS: N/A  Assessment:  Ductal carcinoma in situ (DCIS) of left  breast [D05.12] S/p excision  Plan:    Recommend excision of positive margins as noted on path report.  1. Discussed the risk of surgery including recurrence, chronic pain, post-op infxn, poor/delayed wound healing, poor cosmesis, seroma, hematoma formation, and possible re-operation to address said risks. The risks of general anesthetic, if used, includes MI, CVA, sudden death or even reaction to anesthetic medications also discussed.  Typical post-op recovery time and possbility of activity restrictions were also discussed.  Alternatives include continued observation.  Benefits include possible symptom relief, pathologic evaluation, and/or curative excision.   The patient verbalized understanding and all questions were answered to the patient's satisfaction. Will schedule.  labs/images/medications/previous chart entries reviewed personally and relevant changes/updates noted above.

## 2022-11-14 ENCOUNTER — Ambulatory Visit: Payer: Medicare Other | Admitting: Occupational Therapy

## 2022-11-14 ENCOUNTER — Inpatient Hospital Stay: Payer: Medicare Other | Admitting: Occupational Therapy

## 2022-11-20 ENCOUNTER — Ambulatory Visit: Payer: Medicare Other | Admitting: Oncology

## 2022-11-20 ENCOUNTER — Institutional Professional Consult (permissible substitution): Payer: Medicare Other | Admitting: Radiation Oncology

## 2022-11-21 ENCOUNTER — Ambulatory Visit: Payer: Medicare Other | Admitting: Occupational Therapy

## 2022-11-27 ENCOUNTER — Other Ambulatory Visit: Payer: Self-pay | Admitting: Internal Medicine

## 2022-11-27 ENCOUNTER — Other Ambulatory Visit: Payer: Self-pay

## 2022-11-27 ENCOUNTER — Encounter: Payer: Self-pay | Admitting: Surgery

## 2022-11-27 ENCOUNTER — Encounter
Admission: RE | Admit: 2022-11-27 | Discharge: 2022-11-27 | Disposition: A | Discharge status: Home / Self Care | Payer: Medicare Other | Source: Ambulatory Visit | Attending: Surgery | Admitting: Surgery

## 2022-11-27 VITALS — Ht 61.0 in | Wt 118.0 lb

## 2022-11-27 DIAGNOSIS — R000 Tachycardia, unspecified: Secondary | ICD-10-CM

## 2022-11-27 DIAGNOSIS — Z01812 Encounter for preprocedural laboratory examination: Secondary | ICD-10-CM

## 2022-11-27 DIAGNOSIS — R0602 Shortness of breath: Secondary | ICD-10-CM

## 2022-11-27 DIAGNOSIS — Z01810 Encounter for preprocedural cardiovascular examination: Secondary | ICD-10-CM

## 2022-11-27 HISTORY — DX: Diverticulosis of large intestine without perforation or abscess without bleeding: K57.30

## 2022-11-27 HISTORY — DX: Shortness of breath: R06.02

## 2022-11-27 NOTE — Patient Instructions (Addendum)
Your procedure is scheduled on: Thursday, December 5 Report to the Registration Desk on the 1st floor of the [facility] To find out your arrival time, please call [phone] between 1PM - 3PM on: Wednesday, December 4 If your arrival time is 6:00 am, do not arrive before that time as the [facility] entrance doors do not open until 6:00 am.  REMEMBER: Instructions that are not followed completely may result in serious medical risk, up to and including death; or upon the discretion of your surgeon and anesthesiologist your surgery may need to be rescheduled.  Do not eat food after midnight the night before surgery.  No gum chewing or hard candies.  You may however, drink CLEAR liquids up to 2 hours before you are scheduled to arrive for your surgery. Do not drink anything within 2 hours of your scheduled arrival time.  Clear liquids include: - water  - apple juice without pulp - gatorade (not RED colors) - black coffee or tea (Do NOT add milk or creamers to the coffee or tea) Do NOT drink anything that is not on this list.  One week prior to surgery: starting November 28 Stop Anti-inflammatories (NSAIDS) such as Advil, Aleve, Ibuprofen, Motrin, Naproxen, Naprosyn and Aspirin based products such as Excedrin, Goody's Powder, BC Powder. Stop ANY OVER THE COUNTER supplements until after surgery. Stop multiple vitamins, vitamin D.  You may however, continue to take Tylenol if needed for pain up until the day of surgery.  Continue taking all of your other prescription medications up until the day of surgery.  ON THE DAY OF SURGERY ONLY TAKE THESE MEDICATIONS:  Breztri inhaler  Use inhalers on the day of surgery and bring your albuterol inhaler to the hospital.  No Alcohol for 24 hours before or after surgery.  No Smoking including e-cigarettes for 24 hours before surgery.  No chewable tobacco products for at least 6 hours before surgery.  No nicotine patches on the day of  surgery.  Do not use any "recreational" drugs for at least a week (preferably 2 weeks) before your surgery.  Please be advised that the combination of cocaine and anesthesia may have negative outcomes, up to and including death. If you test positive for cocaine, your surgery will be cancelled.  On the morning of surgery brush your teeth with toothpaste and water, you may rinse your mouth with mouthwash if you wish. Do not swallow any toothpaste or mouthwash.  Use CHG Soap as directed on instruction sheet.  Do not wear jewelry, make-up, hairpins, clips or nail polish.  For welded (permanent) jewelry: bracelets, anklets, waist bands, etc.  Please have this removed prior to surgery.  If it is not removed, there is a chance that hospital personnel will need to cut it off on the day of surgery.  Do not wear lotions, powders, or perfumes.   Do not shave body hair from the neck down 48 hours before surgery.  Contact lenses, hearing aids and dentures may not be worn into surgery.  Do not bring valuables to the hospital. [facility] is not responsible for any missing/lost belongings or valuables.   Notify your doctor if there is any change in your medical condition (cold, fever, infection).  Wear comfortable clothing (specific to your surgery type) to the hospital.  After surgery, you can help prevent lung complications by doing breathing exercises.  Take deep breaths and cough every 1-2 hours.   If you are being discharged the day of surgery, you  will not be allowed to drive home. You will need a responsible individual to drive you home and stay with you for 24 hours after surgery.   If you are taking public transportation, you will need to have a responsible individual with you.  Please call the Pre-admissions Testing Dept. at [phone] if you have any questions about these instructions.  Surgery Visitation Policy:  Patients having surgery or a procedure may have two visitors.   Children under the age of 17 must have an adult with them who is not the patient.     Preparing for Surgery with CHLORHEXIDINE GLUCONATE (CHG) Soap  Chlorhexidine Gluconate (CHG) Soap  o An antiseptic cleaner that kills germs and bonds with the skin to continue killing germs even after washing  o Used for showering the night before surgery and morning of surgery  Before surgery, you can play an important role by reducing the number of germs on your skin.  CHG (Chlorhexidine gluconate) soap is an antiseptic cleanser which kills germs and bonds with the skin to continue killing germs even after washing.  Please do not use if you have an allergy to CHG or antibacterial soaps. If your skin becomes reddened/irritated stop using the CHG.  1. Shower the NIGHT BEFORE SURGERY and the MORNING OF SURGERY with CHG soap.  2. If you choose to wash your hair, wash your hair first as usual with your normal shampoo.  3. After shampooing, rinse your hair and body thoroughly to remove the shampoo.  4. Use CHG as you would any other liquid soap. You can apply CHG directly to the skin and wash gently with a scrungie or a clean washcloth.  5. Apply the CHG soap to your body only from the neck down. Do not use on open wounds or open sores. Avoid contact with your eyes, ears, mouth, and genitals (private parts). Wash face and genitals (private parts) with your normal soap.  6. Wash thoroughly, paying special attention to the area where your surgery will be performed.  7. Thoroughly rinse your body with warm water.  8. Do not shower/wash with your normal soap after using and rinsing off the CHG soap.  9. Pat yourself dry with a clean towel.  10. Wear clean pajamas to bed the night before surgery.  12. Place clean sheets on your bed the night of your first shower and do not sleep with pets.  13. Shower again with the CHG soap on the day of surgery prior to arriving at the hospital.  14. Do not  apply any deodorants/lotions/powders.  15. Please wear clean clothes to the hospital.

## 2022-11-27 NOTE — Pre-Procedure Instructions (Signed)
Patient was seen by cardiologist, Dr. Juliann Pares on 11/21/22 for shortness of breath and tachycardia; HR recorded at 104. Patient was started on diltiazem and ordered to have cardiac MRI. No other notes or testing was seen from this appointment. After interview with the patient; she stated that she did not have and EKG done at that appointment and did not know when she was having the MRI done. She was waiting for a call from the cardiology office. Dr. Glennis Brink office called to inquire as to when the patient was scheduled for this MRI and if it was required before her next week surgery. Message was to be sent to the nurse. Return call to the patient to inform of the above. Appointment was made for the patient to come to Pre-admission testing dept tomorrow for EKG.

## 2022-11-28 ENCOUNTER — Inpatient Hospital Stay: Admission: RE | Admit: 2022-11-28 | Payer: Medicare Other | Source: Ambulatory Visit

## 2022-12-03 ENCOUNTER — Encounter
Admission: RE | Admit: 2022-12-03 | Discharge: 2022-12-03 | Disposition: A | Discharge status: Home / Self Care | Payer: Medicare Other | Source: Ambulatory Visit | Attending: Surgery | Admitting: Surgery

## 2022-12-03 ENCOUNTER — Encounter: Payer: Self-pay | Admitting: Surgery

## 2022-12-03 DIAGNOSIS — J984 Other disorders of lung: Secondary | ICD-10-CM | POA: Insufficient documentation

## 2022-12-03 DIAGNOSIS — I4510 Unspecified right bundle-branch block: Secondary | ICD-10-CM | POA: Insufficient documentation

## 2022-12-03 DIAGNOSIS — R0602 Shortness of breath: Secondary | ICD-10-CM

## 2022-12-03 DIAGNOSIS — R000 Tachycardia, unspecified: Secondary | ICD-10-CM | POA: Diagnosis not present

## 2022-12-03 DIAGNOSIS — Z01812 Encounter for preprocedural laboratory examination: Secondary | ICD-10-CM

## 2022-12-03 DIAGNOSIS — Z01810 Encounter for preprocedural cardiovascular examination: Secondary | ICD-10-CM | POA: Diagnosis not present

## 2022-12-03 NOTE — Progress Notes (Signed)
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 Clinical nursing staff has updated patient's PMH/PSHx, current medication list, and drug allergies/intolerances to ensure comprehensive history available to assist in medical decision making as it pertains to the aforementioned surgical procedure and anticipated anesthetic course. Extensive review of available clinical information personally performed. [place] [facility] and PSHx updated with any diagnoses/procedures that  may have been inadvertently omitted during her intake with the pre-admission testing department's nursing staff.  Clinical Discussion:  Ruth Morgan is a 67 y.o. female who is submitted for pre-surgical anesthesia review and clearance prior to her undergoing the above procedure. Patient is a Former Smoker (34.5 pack years; quit 12/2021). Pertinent PMH includes: CAD, diastolic dysfunction, angina, HTN, HLD, prediabetes, DOE, COPD, asthma, GERD (no daily Tx), DCIS LEFT breast, anxiety, ADHD.  Patient is followed by cardiology Juliann Pares, MD). She was last seen in the cardiology clinic on 11/21/2022; notes reviewed. At the time of her clinic visit, patient was complaining of episodes of orthostatic hypotension, tachycardia, and shortness of breath.  Shortness of breath felt to be, at least partially, related to patient's underlying COPD diagnosis and extended smoking  history. Patient denied any chest pain, PND, orthopnea, palpitations, significant peripheral edema, weakness, fatigue, vertiginous symptoms, or presyncope/syncope. Patient with a past medical history significant for cardiovascular diagnoses. Documented physical exam was grossly benign, providing no evidence of acute exacerbation and/or decompensation of the patient's known cardiovascular conditions.  TTE performed on 03/01/2021 revealed a low normal left ventricular systolic function with an EF of 50%.  There were no regional wall motion abnormalities. Left ventricular diastolic Doppler parameters consistent with abnormal relaxation (G1DD).  Right ventricular size and function normal.  There was trivial mitral and pulmonary, in addition to mild tricuspid and moderate aortic, valve regurgitation.  RVSP 20.8 mmHg. All transvalvular gradients were noted to be normal providing no evidence suggestive of valvular stenosis. Aorta normal in size with no evidence of aneurysmal dilatation.  Myocardial perfusion imaging study was performed on 03/01/2021 revealing a mildly reduced left ventricular systolic function with an EF of 45-50%.  There were no regional wall motion abnormalities.  There was homogenous tracer distribution throughout the myocardium suggestive of no stress-induced myocardial ischemia or arrhythmia; no scintigraphic evidence of scar.  Study determined to be low risk overall.  Coronary CTA was performed on 07/26/2022 that demonstrated an Agatston coronary artery calcium score of 25.5. This placed patient in the 58th percentile for age, sex, and race matched controls. Calcium depositions noted to be isolated mainly in the RCA distribution.  Study demonstrates normal coronary origin with RIGHT dominance.  Blood pressure reasonably controlled at 130/82 mmHg on currently prescribed CCB (diltiazem) monotherapy.  Patient is on rosuvastatin for her HLD diagnosis and ASCVD prevention.  Patient has a  prediabetes diagnosis.  Her last hemoglobin A1c was 6.3% when checked on 08/17/2022.  Patient does not have an OSAH diagnosis.  Functional capacity somewhat limited by  patient's recent episodes of orthostatic hypotension, tachycardia, and chronic exertional dyspnea.  With that said, she is able to complete all of her ADLs/IADLs independently without significant cardiovascular limitation.  Per the [facility]AS[facility], patient is able to exceed 4 METS of physical activity without experiencing significant angina/anginal equivalent symptoms.  No changes were made to her medication regimen.  Given patient's symptoms, cardiology warnings to pursue further workup for potential valvulopathies, further assessment of her overall left ventricular function, and evaluation of possible aortic insufficiency. Cardiac MRI has been ordered.  Patient to follow-up with outpatient cardiology in 3 months or sooner if needed.  Ruth Morgan underwent stereotactic biopsy of her LEFT breast on 10/05/2022 following an abnormal mammogram.  Pathology showed DCIS.  Patient underwent lumpectomy on 11/01/2022.  Final pathology suggested residual DCIS.  There was no malignancy or atypia noted in the biopsied sentinel lymph node.  Given findings on final pathology,patient will require reexcision.  She has subsequently met with surgery and has been scheduled for a repeat LEFT BREAST LUMPECTOMY on 12/06/2022 with Dr. Sung Amabile, DO. Given patient's past medical history significant for cardiovascular diagnoses, presurgical cardiac clearance was sought by the PAT team.  Per cardiology, "this patient is optimized for surgery and may proceed with the planned procedural course with a LOW risk of significant perioperative cardiovascular complications".  Regarding previously mentioned cardiac MRI, cardiology advising that patient can plan on having the study performed following upcoming breast surgery.  In review of her medication reconciliation, the patient is  not noted to be taking any type of anticoagulation or antiplatelet therapies that would need to be held during her perioperative course.  Patient denies previous perioperative complications with anesthesia in the past. In review of the available records, it is noted that patient underwent a general anesthetic course here at [facility] ([facility]) in 10/2022 without documented complications.      11/27/2022    1:49 PM 11/01/2022   11:19 AM 11/01/2022   11:00 AM  Vitals with BMI  Height 5\' 1"     Weight 118 lbs    BMI 22.31    Systolic  155 148  Diastolic  88 79  Pulse  93 77    Providers/Specialists:   NOTE: Primary physician provider listed below. Patient may have been seen by APP or partner within same practice.   PROVIDER ROLE / SPECIALTY LAST Michelle Nasuti, DO  General Surgery (Surgeon)  11/13/2022  Lorrine Kin, MD Primary Care Provider  10/01/2022  Rudean Hitt, MD Cardiology  11/21/2022   Allergies:  Lisinopril  Current Home Medications:   No current facility-administered medications for this encounter.    albuterol (VENTOLIN HFA) 108 (90 Base) MCG/ACT inhaler   amphetamine-dextroamphetamine (ADDERALL) 20 MG tablet   Budeson-Glycopyrrol-Formoterol (BREZTRI AEROSPHERE) 160-9-4.8 MCG/ACT AERO   Cholecalciferol (VITAMIN D-3) 25 MCG (1000 UT) CAPS   MULTIPLE VITAMIN PO   rosuvastatin (CRESTOR) 20 MG tablet   diltiazem (CARDIZEM CD) 120 MG 24 hr capsule   History:   Past Medical History:  Diagnosis Date   ADHD (attention deficit hyperactivity disorder)    a.) on amphetamine-dextroamphetamine   Angina at rest [facility])    Anxiety state, unspecified 12/05/2012   Asthma    Bilateral inguinal hernia 04/2022   CAD (coronary artery disease) 03/23/2021   a.) MPI 03/01/2021: no ischemia; b.) cCTA 03/23/2021: Ca2+ 1.22 (48th %ile) in RCA; c.) cCTA 07/26/2022: Ca2+ 25.5 (58th %ile) in RCA distribution   Carpal tunnel syndrome  a.) s/p release on the RIGHT   COPD (chronic obstructive pulmonary disease) (HCC)    DDD (degenerative disc disease), lumbar    Diastolic dysfunction 03/01/2021   a.) TTE 03/01/2021: EF 50%, triv MR/PR, mild TR, mod AR, G1DD   Diverticulosis of colon    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   GERD (gastroesophageal reflux disease)    Headache    History of 2019 novel coronavirus disease (COVID-19) 2021   History of bilateral cataract extraction 2016   History of shingles 2013   Hyperlipidemia    Hypertension    Orthostatic hypotension    Prediabetes 12/05/2012   Shingles 06/25/2017   Shortness of breath on exertion    Tubular adenoma of colon 2010   Past Surgical History:  Procedure Laterality Date   APPENDECTOMY  1972   BREAST BIOPSY Left 2016   needle bx /clip neg   BREAST BIOPSY Left 10/05/2022   stereo bx, asymmetry, X clip - pathology (+) for DCIS   BREAST BIOPSY Left 10/05/2022   MM LT BREAST BX W LOC DEV 1ST LESION IMAGE BX SPEC STEREO GUIDE 10/05/2022 [facility]   BROW LIFT Bilateral 04/16/2017   Procedure: BLEPHAROPLASTY UPPER EYELID: W/EXCESS SKIN;  Surgeon: Imagene Riches, MD;  Location: [facility];  Service: Ophthalmology;  Laterality: Bilateral;   CARPAL TUNNEL RELEASE Right 2014   CATARACT EXTRACTION W/ INTRAOCULAR LENS IMPLANT Right 05/04/2014   CATARACT EXTRACTION W/ INTRAOCULAR LENS IMPLANT Left 06/09/2014   COLONOSCOPY N/A 06/02/2021   Procedure: COLONOSCOPY;  Surgeon: Jaynie Collins, DO;  Location: [facility];  Service: Gastroenterology;  Laterality: N/A;   COLONOSCOPY     2016, 2022   FINGER AMPUTATION Left    ring finger   INSERTION OF MESH Bilateral 04/27/2022   Procedure: INSERTION OF MESH;  Surgeon: Sung Amabile, DO;  Location: [facility] ORS;  Service: General;  Laterality: Bilateral;   LASIK  2006   NASAL SINUS SURGERY  2004   PART MASTECTOMY,RADIO FREQUENCY LOCALIZER,AXILLARY SENTINEL NODE BIOPSY Left 11/01/2022    Procedure: PART MASTECTOMY,RADIO FREQUENCY LOCALIZER,AXILLARY SENTINEL NODE BIOPSY;  Surgeon: Sung Amabile, DO;  Location: [facility] ORS;  Service: General;  Laterality: Left;   TOTAL ABDOMINAL HYSTERECTOMY W/ BILATERAL SALPINGOOPHORECTOMY  1985   benign ovarian tumors   Family History  Problem Relation Age of Onset   Hypertension Mother    Stomach cancer Father        d. early 25s   CAD Father 45       MI   Kidney failure Father        ESRD on HD   Hyperlipidemia Father    Diabetes Sister    CAD Sister        MI   Diabetes Brother    CAD Brother        MI   Lung cancer Paternal Uncle    Lung cancer Paternal Uncle    Ovarian cancer Paternal Grandmother        d. 64s   Lung cancer Paternal Grandfather        d.>50   Breast cancer Niece 27   Social History   Tobacco Use   Smoking status: Former    Current packs/day: 0.00    Average packs/day: 0.8 packs/day for 46.0 years (34.5 ttl pk-yrs)    Types: Cigarettes    Start date: 12/1975    Quit date: 12/2021    Years since quitting: 1.0   Smokeless tobacco: Never  Tobacco comments:    .5 ppd currently  Vaping Use   Vaping status: Never Used  Substance Use Topics   Alcohol use: Yes    Comment: rare   Drug use: No    Pertinent Clinical Results:  LABS:   Lab Results  Component Value Date   WBC 8.0 10/10/2022   HGB 14.6 10/10/2022   HCT 45.2 10/10/2022   MCV 95.8 10/10/2022   PLT 379 10/10/2022   Lab Results  Component Value Date   NA 139 10/10/2022   K 4.5 10/10/2022   CO2 26 10/10/2022   GLUCOSE 100 (H) 10/10/2022   BUN 11 10/10/2022   CREATININE 0.61 10/10/2022   CALCIUM 9.6 10/10/2022   GFR 83.77 04/27/2013   GFRNONAA >60 10/10/2022    ECG: Date: 12/03/2022 Time ECG obtained: 1333 PM Rate: 89 bpm Rhythm: normal sinus Axis (leads I and aVF): Right axis deviation Intervals: PR 150 ms. QRS 92 ms. QTc 445 ms. ST segment and T wave changes: No evidence of acute ST segment elevation or depression.    Comparison: Similar to previous tracing obtained on 04/30/2022   IMAGING / PROCEDURES: CT CORONARY MORPH W/CTA COR W/SCORE W/CA W/CM &/OR WO/CM performed on 07/26/2022 Coronary calcium score of 25.5. This was 58th percentile for age and sex matched control. Normal coronary origin with right dominance. Minimal RCA stenosis (<25%). CAD-RADS 1. Minimal non-obstructive CAD (0-24%). Consider non-atherosclerotic causes of chest pain. Consider preventive therapy and risk factor modification.  MYOCARDIAL PERFUSION IMAGING STUDY (LEXISCAN) performed on 03/01/2021 Mildly reduced left ventricular systolic function with a LVEF of 45-50% Normal myocardial thickening and wall motion Left ventricular cavity size normal SPECT images demonstrate homogenous tracer distribution throughout the myocardium No evidence of stress-induced myocardial ischemia or arrhythmia Normal low risk study  TRANSTHORACIC ECHOCARDIOGRAM performed on 03/01/2021 Low normal left ventricular systolic function with an EF of 50% No regional wall motion abnormalities Left ventricular diastolic Doppler parameters consistent with abnormal relaxation (G1DD). Right ventricular size and function normal Trivial MR and PR Mild TR; RVSP 20.8 mmHg Moderate AR Normal gradients; no valvular stenosis No pericardial effusion  Impression and Plan:  Ruth Morgan has been referred for pre-anesthesia review and clearance prior to her undergoing the planned anesthetic and procedural courses. Available labs, pertinent testing, and imaging results were personally reviewed by me in preparation for upcoming operative/procedural course. [facility] medical record has been updated following extensive record review and patient interview with PAT staff.   This patient has been appropriately cleared by cardiology with an overall LOW risk of experiencing significant perioperative cardiovascular complications. Based on clinical review performed today  (12/03/22), barring any significant acute changes in the patient's overall condition, it is anticipated that she will be able to proceed with the planned surgical intervention. Any acute changes in clinical condition may necessitate her procedure being postponed and/or cancelled. Patient will meet with anesthesia team (MD and/or CRNA) on the day of her procedure for preoperative evaluation/assessment. Questions regarding anesthetic course will be fielded at that time.   Pre-surgical instructions were reviewed with the patient during her PAT appointment, and questions were fielded to satisfaction by PAT clinical staff. She has been instructed on which medications that she will need to hold prior to surgery, as well as the ones that have been deemed safe/appropriate to take on the day of her procedure. As part of the general education provided by PAT, patient made aware both verbally and in writing, that she would need to abstain from  the use of any illegal substances during her perioperative course.  She was advised that failure to follow the provided instructions could necessitate case cancellation or result in serious perioperative complications up to and including death. Patient encouraged to contact PAT and/or her surgeon's office to discuss any questions or concerns that may arise prior to surgery; verbalized understanding.   Quentin Mulling, MSN, APRN, FNP-C, CEN [facility]  Perioperative Services Nurse Practitioner Phone: [phone] Fax: [phone] 12/03/22 4:18 PM  NOTE: This note has been prepared using Dragon dictation software. Despite my best ability to proofread, there is always the potential that unintentional transcriptional errors may still occur from this process.

## 2022-12-05 ENCOUNTER — Other Ambulatory Visit: Payer: Self-pay | Admitting: Emergency Medicine

## 2022-12-05 DIAGNOSIS — J432 Centrilobular emphysema: Secondary | ICD-10-CM

## 2022-12-05 DIAGNOSIS — R911 Solitary pulmonary nodule: Secondary | ICD-10-CM

## 2022-12-05 MED ORDER — CHLORHEXIDINE GLUCONATE 0.12 % MT SOLN: 15.0000 mL | Freq: Once | OROMUCOSAL | Status: DC

## 2022-12-05 MED ORDER — LACTATED RINGERS IV SOLN: INTRAVENOUS | Status: DC

## 2022-12-05 MED ORDER — ORAL CARE MOUTH RINSE: 15.0000 mL | Freq: Once | OROMUCOSAL | Status: DC

## 2022-12-05 MED ORDER — CEFAZOLIN SODIUM-DEXTROSE 2-4 GM/100ML-% IV SOLN
2.0000 g | INTRAVENOUS | Status: AC
  Administered 2022-12-06: 2 g via INTRAVENOUS

## 2022-12-05 MED ORDER — CHLORHEXIDINE GLUCONATE CLOTH 2 % EX PADS: 6.0000 | MEDICATED_PAD | Freq: Once | CUTANEOUS | Status: DC

## 2022-12-06 ENCOUNTER — Encounter
Admission: RE | Disposition: A | Discharge status: Home / Self Care | Payer: Self-pay | Source: Home / Self Care | Attending: Surgery

## 2022-12-06 ENCOUNTER — Encounter: Payer: Self-pay | Admitting: Surgery

## 2022-12-06 ENCOUNTER — Ambulatory Visit: Payer: Medicare Other | Admitting: Urgent Care

## 2022-12-06 ENCOUNTER — Ambulatory Visit
Admission: RE | Admit: 2022-12-06 | Discharge: 2022-12-06 | Disposition: A | Discharge status: Home / Self Care | Payer: Medicare Other | Attending: Surgery | Admitting: Surgery

## 2022-12-06 ENCOUNTER — Other Ambulatory Visit: Payer: Self-pay

## 2022-12-06 DIAGNOSIS — Z833 Family history of diabetes mellitus: Secondary | ICD-10-CM | POA: Diagnosis not present

## 2022-12-06 DIAGNOSIS — R519 Headache, unspecified: Secondary | ICD-10-CM | POA: Insufficient documentation

## 2022-12-06 DIAGNOSIS — F909 Attention-deficit hyperactivity disorder, unspecified type: Secondary | ICD-10-CM | POA: Diagnosis not present

## 2022-12-06 DIAGNOSIS — D0512 Intraductal carcinoma in situ of left breast: Secondary | ICD-10-CM | POA: Insufficient documentation

## 2022-12-06 DIAGNOSIS — Z87891 Personal history of nicotine dependence: Secondary | ICD-10-CM | POA: Diagnosis not present

## 2022-12-06 DIAGNOSIS — I25119 Atherosclerotic heart disease of native coronary artery with unspecified angina pectoris: Secondary | ICD-10-CM | POA: Insufficient documentation

## 2022-12-06 DIAGNOSIS — Z803 Family history of malignant neoplasm of breast: Secondary | ICD-10-CM | POA: Diagnosis not present

## 2022-12-06 DIAGNOSIS — K219 Gastro-esophageal reflux disease without esophagitis: Secondary | ICD-10-CM | POA: Diagnosis not present

## 2022-12-06 DIAGNOSIS — I10 Essential (primary) hypertension: Secondary | ICD-10-CM | POA: Insufficient documentation

## 2022-12-06 DIAGNOSIS — Z79899 Other long term (current) drug therapy: Secondary | ICD-10-CM | POA: Insufficient documentation

## 2022-12-06 DIAGNOSIS — J449 Chronic obstructive pulmonary disease, unspecified: Secondary | ICD-10-CM | POA: Diagnosis not present

## 2022-12-06 HISTORY — PX: BREAST LUMPECTOMY: SHX2

## 2022-12-06 SURGERY — BREAST LUMPECTOMY
Anesthesia: General | Site: Breast | Laterality: Left

## 2022-12-06 MED ORDER — LIDOCAINE HCL 1 % IJ SOLN
INTRAMUSCULAR | Status: DC | PRN
  Administered 2022-12-06: 10 mL

## 2022-12-06 MED ORDER — ONDANSETRON HCL 4 MG/2ML IJ SOLN
INTRAMUSCULAR | Status: DC | PRN
  Administered 2022-12-06: 4 mg via INTRAVENOUS

## 2022-12-06 MED ORDER — ACETAMINOPHEN 10 MG/ML IV SOLN
INTRAVENOUS | Status: DC | PRN
  Administered 2022-12-06: 1000 mg via INTRAVENOUS

## 2022-12-06 MED ORDER — PROPOFOL 10 MG/ML IV BOLUS
INTRAVENOUS | Status: DC | PRN
  Administered 2022-12-06: 140 mg via INTRAVENOUS

## 2022-12-06 MED ORDER — OXYCODONE HCL 5 MG/5ML PO SOLN: 5.0000 mg | Freq: Once | ORAL | Status: AC | PRN

## 2022-12-06 MED ORDER — STERILE WATER FOR IRRIGATION IR SOLN
Status: DC | PRN
  Administered 2022-12-06: 500 mL

## 2022-12-06 MED ORDER — DEXAMETHASONE SODIUM PHOSPHATE 10 MG/ML IJ SOLN
INTRAMUSCULAR | Status: DC | PRN
  Administered 2022-12-06: 10 mg via INTRAVENOUS

## 2022-12-06 MED ORDER — FENTANYL CITRATE (PF) 100 MCG/2ML IJ SOLN: 25.0000 ug | INTRAMUSCULAR | Status: DC | PRN

## 2022-12-06 MED ORDER — PROPOFOL 10 MG/ML IV BOLUS
INTRAVENOUS | Status: AC
  Filled 2022-12-06: qty 20

## 2022-12-06 MED ORDER — BUPIVACAINE HCL (PF) 0.5 % IJ SOLN
INTRAMUSCULAR | Status: AC
  Filled 2022-12-06: qty 30

## 2022-12-06 MED ORDER — FENTANYL CITRATE (PF) 100 MCG/2ML IJ SOLN
INTRAMUSCULAR | Status: AC
  Filled 2022-12-06: qty 2

## 2022-12-06 MED ORDER — DOCUSATE SODIUM 100 MG PO CAPS: 100.0000 mg | ORAL_CAPSULE | Freq: Two times a day (BID) | ORAL | 0 refills | Status: AC | PRN

## 2022-12-06 MED ORDER — PHENYLEPHRINE 80 MCG/ML (10ML) SYRINGE FOR IV PUSH (FOR BLOOD PRESSURE SUPPORT)
PREFILLED_SYRINGE | INTRAVENOUS | Status: AC
  Filled 2022-12-06: qty 10

## 2022-12-06 MED ORDER — FENTANYL CITRATE (PF) 100 MCG/2ML IJ SOLN
INTRAMUSCULAR | Status: DC | PRN
  Administered 2022-12-06 (×2): 50 ug via INTRAVENOUS

## 2022-12-06 MED ORDER — EPHEDRINE 5 MG/ML INJ
INTRAVENOUS | Status: AC
  Filled 2022-12-06: qty 5

## 2022-12-06 MED ORDER — CEFAZOLIN SODIUM-DEXTROSE 2-4 GM/100ML-% IV SOLN
INTRAVENOUS | Status: AC
  Filled 2022-12-06: qty 100

## 2022-12-06 MED ORDER — PHENYLEPHRINE 80 MCG/ML (10ML) SYRINGE FOR IV PUSH (FOR BLOOD PRESSURE SUPPORT)
PREFILLED_SYRINGE | INTRAVENOUS | Status: DC | PRN
  Administered 2022-12-06 (×4): 80 ug via INTRAVENOUS

## 2022-12-06 MED ORDER — ONDANSETRON HCL 4 MG/2ML IJ SOLN
INTRAMUSCULAR | Status: AC
  Filled 2022-12-06: qty 2

## 2022-12-06 MED ORDER — OXYCODONE HCL 5 MG PO TABS
ORAL_TABLET | ORAL | Status: AC
  Filled 2022-12-06: qty 1

## 2022-12-06 MED ORDER — ACETAMINOPHEN 10 MG/ML IV SOLN
INTRAVENOUS | Status: AC
  Filled 2022-12-06: qty 100

## 2022-12-06 MED ORDER — OXYCODONE HCL 5 MG PO TABS
5.0000 mg | ORAL_TABLET | Freq: Once | ORAL | Status: AC | PRN
  Administered 2022-12-06: 5 mg via ORAL

## 2022-12-06 MED ORDER — LIDOCAINE HCL (CARDIAC) PF 100 MG/5ML IV SOSY
PREFILLED_SYRINGE | INTRAVENOUS | Status: DC | PRN
  Administered 2022-12-06: 100 mg via INTRAVENOUS

## 2022-12-06 MED ORDER — LIDOCAINE HCL (PF) 1 % IJ SOLN
INTRAMUSCULAR | Status: AC
  Filled 2022-12-06: qty 30

## 2022-12-06 MED ORDER — BUPIVACAINE-EPINEPHRINE 0.5% -1:200000 IJ SOLN
INTRAMUSCULAR | Status: DC | PRN
  Administered 2022-12-06: 10 mL

## 2022-12-06 MED ORDER — LIDOCAINE HCL (PF) 2 % IJ SOLN
INTRAMUSCULAR | Status: AC
  Filled 2022-12-06: qty 5

## 2022-12-06 MED ORDER — CHLORHEXIDINE GLUCONATE 0.12 % MT SOLN
OROMUCOSAL | Status: AC
  Filled 2022-12-06: qty 15

## 2022-12-06 MED ORDER — DEXAMETHASONE SODIUM PHOSPHATE 10 MG/ML IJ SOLN
INTRAMUSCULAR | Status: AC
  Filled 2022-12-06: qty 1

## 2022-12-06 MED ORDER — MIDAZOLAM HCL 2 MG/2ML IJ SOLN
INTRAMUSCULAR | Status: AC
  Filled 2022-12-06: qty 2

## 2022-12-06 MED ORDER — MIDAZOLAM HCL 2 MG/2ML IJ SOLN
INTRAMUSCULAR | Status: DC | PRN
  Administered 2022-12-06: 2 mg via INTRAVENOUS

## 2022-12-06 MED ORDER — EPHEDRINE SULFATE-NACL 50-0.9 MG/10ML-% IV SOSY
PREFILLED_SYRINGE | INTRAVENOUS | Status: DC | PRN
  Administered 2022-12-06 (×2): 5 mg via INTRAVENOUS

## 2022-12-06 SURGICAL SUPPLY — 31 items
BLADE PHOTON ILLUMINATED (MISCELLANEOUS) ×1 IMPLANT
BLADE SURG 15 STRL LF DISP TIS (BLADE) ×1 IMPLANT
CHLORAPREP W/TINT 26 (MISCELLANEOUS) ×1 IMPLANT
CNTNR URN SCR LID CUP LEK RST (MISCELLANEOUS) IMPLANT
DERMABOND ADVANCED .7 DNX12 (GAUZE/BANDAGES/DRESSINGS) ×1 IMPLANT
DEVICE DUBIN SPECIMEN MAMMOGRA (MISCELLANEOUS) ×1 IMPLANT
DRAPE LAPAROTOMY 77X122 PED (DRAPES) ×1 IMPLANT
ELECT REM PT RETURN 9FT ADLT (ELECTROSURGICAL) ×1
ELECTRODE REM PT RTRN 9FT ADLT (ELECTROSURGICAL) ×1 IMPLANT
GLOVE BIOGEL PI IND STRL 7.0 (GLOVE) ×1 IMPLANT
GLOVE SURG SYN 6.5 ES PF (GLOVE) ×1 IMPLANT
GLOVE SURG SYN 6.5 PF PI (GLOVE) ×1 IMPLANT
GOWN STRL REUS W/ TWL LRG LVL3 (GOWN DISPOSABLE) ×2 IMPLANT
KIT MARKER MARGIN INK (KITS) ×1 IMPLANT
KIT TURNOVER KIT A (KITS) ×1 IMPLANT
LABEL OR SOLS (LABEL) ×1 IMPLANT
LIGHT WAVEGUIDE WIDE FLAT (MISCELLANEOUS) IMPLANT
MANIFOLD NEPTUNE II (INSTRUMENTS) ×1 IMPLANT
MARKER MARGIN CORRECT CLIP (MARKER) ×1 IMPLANT
NDL HYPO 22X1.5 SAFETY MO (MISCELLANEOUS) ×1 IMPLANT
NEEDLE HYPO 22X1.5 SAFETY MO (MISCELLANEOUS) ×1 IMPLANT
PACK BASIN MINOR ARMC (MISCELLANEOUS) ×1 IMPLANT
SUT MNCRL 4-0 27XMFL (SUTURE) ×1
SUT SILK 3 0 12 30 (SUTURE) IMPLANT
SUT VIC AB 3-0 SH 27X BRD (SUTURE) ×1 IMPLANT
SUTURE MNCRL 4-0 27XMF (SUTURE) ×1 IMPLANT
SYR 20ML LL LF (SYRINGE) ×1 IMPLANT
SYR BULB IRRIG 60ML STRL (SYRINGE) ×1 IMPLANT
TRAP FLUID SMOKE EVACUATOR (MISCELLANEOUS) ×1 IMPLANT
TRAP NEPTUNE SPECIMEN COLLECT (MISCELLANEOUS) ×1 IMPLANT
WATER STERILE IRR 500ML POUR (IV SOLUTION) ×1 IMPLANT

## 2022-12-06 NOTE — Transfer of Care (Signed)
Immediate Anesthesia Transfer of Care Note  Patient: Ruth Morgan  Procedure(s) Performed: BREAST LUMPECTOMY (Left: Breast)  Patient Location: PACU  Anesthesia Type:General  Level of Consciousness: drowsy  Airway & Oxygen Therapy: Patient Spontanous Breathing and Patient connected to face mask oxygen  Post-op Assessment: Report given to RN and Post -op Vital signs reviewed and stable  Post vital signs: Reviewed and stable  Last Vitals:  Vitals Value Taken Time  BP 124/61 12/06/22 1600  Temp 35.8 1600  Pulse 74 12/06/22 1603  Resp 11 12/06/22 1603  SpO2 100 % 12/06/22 1603  Vitals shown include unfiled device data.  Last Pain:  Vitals:   12/06/22 1249  TempSrc: Temporal  PainSc: 0-No pain         Complications: No notable events documented.

## 2022-12-06 NOTE — Interval H&P Note (Signed)
No change. OK to proceed.

## 2022-12-06 NOTE — Anesthesia Preprocedure Evaluation (Signed)
Anesthesia Evaluation  Patient identified by MRN, date of birth, ID band Patient awake    Reviewed: Allergy & Precautions, NPO status , Patient's Chart, lab work & pertinent test results  History of Anesthesia Complications Negative for: history of anesthetic complications  Airway Mallampati: III  TM Distance: >3 FB Neck ROM: full    Dental  (+) Chipped, Poor Dentition, Missing   Pulmonary asthma , COPD, former smoker   Pulmonary exam normal        Cardiovascular Exercise Tolerance: Good hypertension, (-) angina + CAD  Normal cardiovascular exam     Neuro/Psych  Headaches PSYCHIATRIC DISORDERS       Neuromuscular disease    GI/Hepatic Neg liver ROS,GERD  Controlled,,  Endo/Other  negative endocrine ROS    Renal/GU      Musculoskeletal   Abdominal   Peds  Hematology negative hematology ROS (+)   Anesthesia Other Findings Patient has cardiac clearance for this procedure.   Past Medical History: No date: ADHD (attention deficit hyperactivity disorder)     Comment:  a.) on amphetamine-dextroamphetamine No date: Angina at rest [facility]) 12/05/2012: Anxiety state, unspecified No date: Asthma 04/2022: Bilateral inguinal hernia 03/23/2021: CAD (coronary artery disease)     Comment:  a.) MPI 03/01/2021: no ischemia; b.) cCTA 03/23/2021:               Ca2+ 1.22 (48th %ile) in RCA; c.) cCTA 07/26/2022: Ca2+               25.5 (58th %ile) in RCA distribution No date: Carpal tunnel syndrome     Comment:  a.) s/p release on the RIGHT No date: COPD (chronic obstructive pulmonary disease) (HCC) No date: DDD (degenerative disc disease), lumbar 03/01/2021: Diastolic dysfunction     Comment:  a.) TTE 03/01/2021: EF 50%, triv MR/PR, mild TR, mod AR,              G1DD No date: Diverticulosis of colon 10/2022: Ductal carcinoma in situ (DCIS) of left breast No date: GERD (gastroesophageal reflux disease) No date: Headache 2021:  History of 2019 novel coronavirus disease (COVID-19) 2016: History of bilateral cataract extraction 2013: History of shingles No date: Hyperlipidemia No date: Hypertension No date: Orthostatic hypotension 12/05/2012: Prediabetes 06/25/2017: Shingles No date: Shortness of breath on exertion 2010: Tubular adenoma of colon  Past Surgical History: 1972: APPENDECTOMY 2016: BREAST BIOPSY; Left     Comment:  needle bx /clip neg 10/05/2022: BREAST BIOPSY; Left     Comment:  stereo bx, asymmetry, X clip - pathology (+) for DCIS 10/05/2022: BREAST BIOPSY; Left     Comment:  MM LT BREAST BX W LOC DEV 1ST LESION IMAGE BX SPEC               STEREO GUIDE 10/05/2022 [facility] 04/16/2017: BROW LIFT; Bilateral     Comment:  Procedure: BLEPHAROPLASTY UPPER EYELID: W/EXCESS SKIN;                Surgeon: Imagene Riches, MD;  Location: [facility];  Service: Ophthalmology;  Laterality: Bilateral; 2014: CARPAL TUNNEL RELEASE; Right 05/04/2014: CATARACT EXTRACTION W/ INTRAOCULAR LENS IMPLANT; Right 06/09/2014: CATARACT EXTRACTION W/ INTRAOCULAR LENS IMPLANT; Left 06/02/2021: COLONOSCOPY; N/A     Comment:  Procedure: COLONOSCOPY;  Surgeon: Jaynie Collins,              DO;  Location: [facility];  Service:  Gastroenterology;  Laterality: N/A; No date: COLONOSCOPY     Comment:  2016, 2022 No date: FINGER AMPUTATION; Left     Comment:  ring finger 04/27/2022: INSERTION OF MESH; Bilateral     Comment:  Procedure: INSERTION OF MESH;  Surgeon: Sung Amabile,               DO;  Location: [facility];  Service: General;  Laterality:               Bilateral; 2006: LASIK 2004: NASAL SINUS SURGERY 11/01/2022: PART MASTECTOMY,RADIO FREQUENCY LOCALIZER,AXILLARY  SENTINEL NODE BIOPSY; Left     Comment:  Procedure: PART MASTECTOMY,RADIO FREQUENCY               LOCALIZER,AXILLARY SENTINEL NODE BIOPSY;  Surgeon: Sung Amabile, DO;  Location: [facility];   Service: General;                Laterality: Left; 1985: TOTAL ABDOMINAL HYSTERECTOMY W/ BILATERAL SALPINGOOPHORECTOMY     Comment:  benign ovarian tumors  BMI    Body Mass Index: 22.48 kg/m      Reproductive/Obstetrics negative OB ROS                             Anesthesia Physical Anesthesia Plan  ASA: 3  Anesthesia Plan: General LMA   Post-op Pain Management:    Induction: Intravenous  PONV Risk Score and Plan: Dexamethasone, Ondansetron, Midazolam and Treatment may vary due to age or medical condition  Airway Management Planned: LMA  Additional Equipment:   Intra-op Plan:   Post-operative Plan: Extubation in OR  Informed Consent: I have reviewed the patients History and Physical, chart, labs and discussed the procedure including the risks, benefits and alternatives for the proposed anesthesia with the patient or authorized representative who has indicated his/her understanding and acceptance.     Dental Advisory Given  Plan Discussed with: Anesthesiologist, CRNA and Surgeon  Anesthesia Plan Comments: (Patient consented for risks of anesthesia including but not limited to:  - adverse reactions to medications - damage to eyes, teeth, lips or other oral mucosa - nerve damage due to positioning  - sore throat or hoarseness - Damage to heart, brain, nerves, lungs, other parts of body or loss of life  Patient voiced understanding and assent.)       Anesthesia Quick Evaluation

## 2022-12-06 NOTE — Op Note (Signed)
Preoperative diagnosis: Left breast DCIS   Postoperative diagnosis: Same.   Procedure: Enough positive margins for left breast  Anesthesia: GETA  Surgeon: Dr. Sung Amabile  Wound Classification: Clean  Indications: See H&P  Specimen: Left breast anterior lateral and inferior margin  Complications: None  Estimated Blood Loss: 20 mL   Description of procedure:  The patient was taken to the operating room and placed supine on the operating table, and after general anesthesia the left breast and axilla were prepped and draped in the usual sterile fashion. A time-out was completed verifying correct patient, procedure, site, positioning, and implant(s) and/or special equipment prior to beginning this procedure.  Local infused around previously made incision and the incision opened.  Dissection carried down to previous biopsy site filled with old hematoma fluid.  The anterior and lateral margin was then dissected off, painted for margins and passed off field pending pathology.  Inspection of the area noted the inferior margin to be somewhat more abnormal in appearance compared to the rest of the biopsy cavity, so this portion was removed as well.  Inferior margin was painted and then passed off pending pathology.    Wound irrigated, hemostasis was achieved and the wound closed in l with running subcuticular suture of Monocryl 4-0, then dressed with dermabond. The patient tolerated the procedure well and was taken to the postanesthesia care unit in stable condition. Sponge and instrument count correct at end of procedure.

## 2022-12-06 NOTE — Anesthesia Procedure Notes (Signed)
Procedure Name: LMA Insertion Date/Time: 12/06/2022 3:10 PM  Performed by: Morene Crocker, CRNAPre-anesthesia Checklist: Patient identified, Patient being monitored, Timeout performed, Emergency Drugs available and Suction available Patient Re-evaluated:Patient Re-evaluated prior to induction Oxygen Delivery Method: Circle system utilized Preoxygenation: Pre-oxygenation with 100% oxygen Induction Type: IV induction Ventilation: Mask ventilation without difficulty LMA: LMA inserted LMA Size: 3.0 Tube type: Oral Number of attempts: 1 Placement Confirmation: positive ETCO2 and breath sounds checked- equal and bilateral Tube secured with: Tape Dental Injury: Teeth and Oropharynx as per pre-operative assessment  Comments: Smooth atraumatic LMA placement, no complications noted

## 2022-12-06 NOTE — Discharge Instructions (Signed)
Removal, Care After This sheet gives you information about how to care for yourself after your procedure. Your health care provider may also give you more specific instructions. If you have problems or questions, contact your health care provider. What can I expect after the procedure? After the procedure, it is common to have: Soreness. Bruising. Itching. Follow these instructions at home: site care Follow instructions from your health care provider about how to take care of your site. Make sure you: Wash your hands with soap and water before and after you change your bandage (dressing). If soap and water are not available, use hand sanitizer. Leave stitches (sutures), skin glue, or adhesive strips in place. These skin closures may need to stay in place for 2 weeks or longer. If adhesive strip edges start to loosen and curl up, you may trim the loose edges. Do not remove adhesive strips completely unless your health care provider tells you to do that. If the area bleeds or bruises, apply gentle pressure for 10 minutes. OK TO SHOWER IN 24HRS  Check your site every day for signs of infection. Check for: Redness, swelling, or pain. Fluid or blood. Warmth. Pus or a bad smell.  General instructions Rest and then return to your normal activities as told by your health care provider.  tylenol and advil as needed for discomfort.  Please alternate between the two every four hours as needed for pain.    Use narcotics, if prescribed, only when tylenol and motrin is not enough to control pain.  325-650mg every 8hrs to max of 3000mg/24hrs (including the 325mg in every norco dose) for the tylenol.    Advil up to 800mg per dose every 8hrs as needed for pain.   Keep all follow-up visits as told by your health care provider. This is important. Contact a health care provider if: You have redness, swelling, or pain around your site. You have fluid or blood coming from your site. Your site feels warm to  the touch. You have pus or a bad smell coming from your site. You have a fever. Your sutures, skin glue, or adhesive strips loosen or come off sooner than expected. Get help right away if: You have bleeding that does not stop with pressure or a dressing. Summary After the procedure, it is common to have some soreness, bruising, and itching at the site. Follow instructions from your health care provider about how to take care of your site. Check your site every day for signs of infection. Contact a health care provider if you have redness, swelling, or pain around your site, or your site feels warm to the touch. Keep all follow-up visits as told by your health care provider. This is important. This information is not intended to replace advice given to you by your health care provider. Make sure you discuss any questions you have with your health care provider. Document Released: 01/14/2015 Document Revised: 06/17/2017 Document Reviewed: 06/17/2017 Elsevier Interactive Patient Education  2019 Elsevier Inc[employer]   

## 2022-12-07 ENCOUNTER — Encounter: Payer: Self-pay | Admitting: Surgery

## 2022-12-07 NOTE — Anesthesia Postprocedure Evaluation (Signed)
Anesthesia Post Note  Patient: Ruth Morgan  Procedure(s) Performed: BREAST LUMPECTOMY (Left: Breast)  Patient location during evaluation: PACU Anesthesia Type: General Level of consciousness: awake and alert Pain management: pain level controlled Vital Signs Assessment: post-procedure vital signs reviewed and stable Respiratory status: spontaneous breathing, nonlabored ventilation and respiratory function stable Cardiovascular status: blood pressure returned to baseline and stable Postop Assessment: no apparent nausea or vomiting Anesthetic complications: no   No notable events documented.   Last Vitals:  Vitals:   12/06/22 1630 12/06/22 1645  BP: 136/78 (!) 155/69  Pulse: 81 88  Resp: 18 18  Temp: (!) 36.3 C (!) 35.9 C  SpO2: 95% 98%    Last Pain:  Vitals:   12/06/22 1645  TempSrc: Temporal  PainSc: 0-No pain                 Foye Deer

## 2022-12-10 ENCOUNTER — Other Ambulatory Visit: Payer: Self-pay | Admitting: Pathology

## 2022-12-10 LAB — SURGICAL PATHOLOGY

## 2022-12-12 ENCOUNTER — Ambulatory Visit
Admission: RE | Admit: 2022-12-12 | Discharge: 2022-12-12 | Disposition: A | Discharge status: Home / Self Care | Payer: Medicare Other | Source: Ambulatory Visit | Attending: Emergency Medicine | Admitting: Emergency Medicine

## 2022-12-12 DIAGNOSIS — J432 Centrilobular emphysema: Secondary | ICD-10-CM | POA: Diagnosis present

## 2022-12-12 DIAGNOSIS — R911 Solitary pulmonary nodule: Secondary | ICD-10-CM | POA: Diagnosis present

## 2022-12-19 ENCOUNTER — Encounter: Payer: Self-pay | Admitting: Oncology

## 2022-12-19 ENCOUNTER — Inpatient Hospital Stay: Payer: Medicare Other | Attending: Oncology | Admitting: Oncology

## 2022-12-19 ENCOUNTER — Encounter: Payer: Self-pay | Admitting: Radiation Oncology

## 2022-12-19 ENCOUNTER — Ambulatory Visit
Admission: RE | Admit: 2022-12-19 | Discharge: 2022-12-19 | Disposition: A | Discharge status: Home / Self Care | Payer: Medicare Other | Source: Ambulatory Visit | Attending: Radiation Oncology | Admitting: Radiation Oncology

## 2022-12-19 ENCOUNTER — Ambulatory Visit: Payer: Medicare Other | Admitting: Oncology

## 2022-12-19 VITALS — BP 158/89 | HR 88 | Temp 97.6°F | Resp 18 | Wt 122.3 lb

## 2022-12-19 VITALS — BP 131/94 | HR 96 | Temp 98.7°F | Wt 122.8 lb

## 2022-12-19 DIAGNOSIS — D0512 Intraductal carcinoma in situ of left breast: Secondary | ICD-10-CM | POA: Diagnosis not present

## 2022-12-19 DIAGNOSIS — Z809 Family history of malignant neoplasm, unspecified: Secondary | ICD-10-CM | POA: Diagnosis not present

## 2022-12-19 DIAGNOSIS — Z87891 Personal history of nicotine dependence: Secondary | ICD-10-CM | POA: Diagnosis not present

## 2022-12-19 DIAGNOSIS — C50912 Malignant neoplasm of unspecified site of left female breast: Secondary | ICD-10-CM

## 2022-12-19 NOTE — Consult Note (Signed)
NEW PATIENT EVALUATION  Name: Ruth Morgan  MRN: [id number]  Date:   12/19/2022     DOB: Jan 24, 1955   This 67 y.o. female patient presents to the clinic for initial evaluation of stage 0 (Tis N0 M0) ductal carcinoma left breast status post wide local excision.  REFERRING PHYSICIAN: Lorrine Kin  CHIEF COMPLAINT:  Chief Complaint  Patient presents with   Breast Cancer    DIAGNOSIS: There were no encounter diagnoses.   PREVIOUS INVESTIGATIONS:  Mammograms CT scan and ultrasound reviewed Clinical notes reviewed Pathology reports reviewed  HPI: Patient is a 67 year old female who presented with an abnormal mammogram of her left breast.  Ultrasound confirmed the mass although no specific size was given.  She underwent a targeted biopsy which was positive for high grade 3 ductal carcinoma in situ with apocrine and secretory features.  She went to have a wide local excision for a 5 cm area of ductal carcinoma in situ grade 3 with expansive comedonecrosis.  DCIS was present at lateral margin unifocal.  There is also within 1 mm of the anterior margin and 1 mm from the medial and inferior margins.  1 regional sentinel lymph node was submitted and was negative for metastatic disease.  Tumor was ER negative.  She went on to have a reexcision with again residual ductal carcinoma in situ present.  There was some controversy about the margin and based on Dr. Geoffery Lyons intraoperative impression he believe the posterior positive margin in the reexcision corresponds to an old anterior lumpectomy margin and was not the true margin there was also DCIS within 1 mm of the anterior and inferior margin.  Patient has done well postoperatively.  She specifically denies breast tenderness cough or bone pain.  PLANNED TREATMENT REGIMEN: Left whole breast radiation plus high dose boost for close margin  PAST MEDICAL HISTORY:  has a past medical history of ADHD (attention deficit hyperactivity  disorder), Angina at rest [facility]), Anxiety state, unspecified (12/05/2012), Asthma, Bilateral inguinal hernia (04/2022), CAD (coronary artery disease) (03/23/2021), Carpal tunnel syndrome, COPD (chronic obstructive pulmonary disease) (HCC), DDD (degenerative disc disease), lumbar, Diastolic dysfunction (03/01/2021), Diverticulosis of colon, Ductal carcinoma in situ (DCIS) of left breast (10/2022), GERD (gastroesophageal reflux disease), Headache, History of 2019 novel coronavirus disease (COVID-19) (2021), History of bilateral cataract extraction (2016), History of shingles (2013), Hyperlipidemia, Hypertension, Orthostatic hypotension, Prediabetes (12/05/2012), Shingles (06/25/2017), Shortness of breath on exertion, and Tubular adenoma of colon (2010).    PAST SURGICAL HISTORY:  Past Surgical History:  Procedure Laterality Date   ABDOMINAL HYSTERECTOMY     APPENDECTOMY  1972   BREAST BIOPSY Left 2016   needle bx /clip neg   BREAST BIOPSY Left 10/05/2022   stereo bx, asymmetry, X clip - pathology (+) for DCIS   BREAST BIOPSY Left 10/05/2022   MM LT BREAST BX W LOC DEV 1ST LESION IMAGE BX SPEC STEREO GUIDE 10/05/2022 [facility]   BREAST LUMPECTOMY Left 12/06/2022   Procedure: BREAST LUMPECTOMY;  Surgeon: Sung Amabile, DO;  Location: [facility];  Service: General;  Laterality: Left;  re-excision of margins   BROW LIFT Bilateral 04/16/2017   Procedure: BLEPHAROPLASTY UPPER EYELID: W/EXCESS SKIN;  Surgeon: Imagene Riches, MD;  Location: [facility];  Service: Ophthalmology;  Laterality: Bilateral;   CARPAL TUNNEL RELEASE Right 2014   CATARACT EXTRACTION W/ INTRAOCULAR LENS IMPLANT Right 05/04/2014   CATARACT EXTRACTION W/ INTRAOCULAR LENS IMPLANT Left 06/09/2014   COLONOSCOPY N/A 06/02/2021   Procedure: COLONOSCOPY;  Surgeon: Elfredia Nevins  Casimiro Needle, DO;  Location: [facility];  Service: Gastroenterology;  Laterality: N/A;   COLONOSCOPY     2016, 2022   FINGER AMPUTATION Left    ring  finger   INSERTION OF MESH Bilateral 04/27/2022   Procedure: INSERTION OF MESH;  Surgeon: Sung Amabile, DO;  Location: [facility];  Service: General;  Laterality: Bilateral;   LASIK  2006   NASAL SINUS SURGERY  2004   PART MASTECTOMY,RADIO FREQUENCY LOCALIZER,AXILLARY SENTINEL NODE BIOPSY Left 11/01/2022   Procedure: PART MASTECTOMY,RADIO FREQUENCY LOCALIZER,AXILLARY SENTINEL NODE BIOPSY;  Surgeon: Sung Amabile, DO;  Location: [facility];  Service: General;  Laterality: Left;   TOTAL ABDOMINAL HYSTERECTOMY W/ BILATERAL SALPINGOOPHORECTOMY  1985   benign ovarian tumors    FAMILY HISTORY: family history includes Breast cancer (age of onset: 12) in her niece; CAD in her brother and sister; CAD (age of onset: 69) in her father; Diabetes in her brother and sister; Hyperlipidemia in her father; Hypertension in her mother; Kidney failure in her father; Lung cancer in her paternal grandfather, paternal uncle, and paternal uncle; Ovarian cancer in her paternal grandmother; Stomach cancer in her father.  SOCIAL HISTORY:  reports that she quit smoking about 12 months ago. Her smoking use included cigarettes. She started smoking about 47 years ago. She has a 34.5 pack-year smoking history. She has never used smokeless tobacco. She reports current alcohol use. She reports that she does not use drugs.  ALLERGIES: Lisinopril  MEDICATIONS:  Current Outpatient Medications  Medication Sig Dispense Refill   acetaminophen (TYLENOL) 325 MG tablet Take 650 mg by mouth every 6 (six) hours as needed for mild pain (pain score 1-3).     albuterol (VENTOLIN HFA) 108 (90 Base) MCG/ACT inhaler Inhale 2 puffs into the lungs every 6 (six) hours as needed for wheezing or shortness of breath. 18 g 0   amphetamine-dextroamphetamine (ADDERALL) 20 MG tablet Take 30 mg by mouth 2 (two) times daily.     Budeson-Glycopyrrol-Formoterol (BREZTRI AEROSPHERE) 160-9-4.8 MCG/ACT AERO Inhale 2 puffs into the lungs in the morning and at  bedtime.     Cholecalciferol (VITAMIN D-3) 25 MCG (1000 UT) CAPS Take 100 Units by mouth daily.     diltiazem (CARDIZEM CD) 120 MG 24 hr capsule Take 120 mg by mouth at bedtime.     MULTIPLE VITAMIN PO Take 1 tablet by mouth daily.     rosuvastatin (CRESTOR) 20 MG tablet Take 20 mg by mouth at bedtime.     No current facility-administered medications for this encounter.    ECOG PERFORMANCE STATUS:  0 - Asymptomatic  REVIEW OF SYSTEMS: Patient denies any weight loss, fatigue, weakness, fever, chills or night sweats. Patient denies any loss of vision, blurred vision. Patient denies any ringing  of the ears or hearing loss. No irregular heartbeat. Patient denies heart murmur or history of fainting. Patient denies any chest pain or pain radiating to her upper extremities. Patient denies any shortness of breath, difficulty breathing at night, cough or hemoptysis. Patient denies any swelling in the lower legs. Patient denies any nausea vomiting, vomiting of blood, or coffee ground material in the vomitus. Patient denies any stomach pain. Patient states has had normal bowel movements no significant constipation or diarrhea. Patient denies any dysuria, hematuria or significant nocturia. Patient denies any problems walking, swelling in the joints or loss of balance. Patient denies any skin changes, loss of hair or loss of weight. Patient denies any excessive worrying or anxiety or significant depression. Patient denies any  problems with insomnia. Patient denies excessive thirst, polyuria, polydipsia. Patient denies any swollen glands, patient denies easy bruising or easy bleeding. Patient denies any recent infections, allergies or URI. Patient "s visual fields have not changed significantly in recent time.   PHYSICAL EXAM: BP (!) 131/94 (BP Location: Right Arm, Patient Position: Sitting, Cuff Size: Normal)   Pulse 96   Temp 98.7 F (37.1 C) (Tympanic)   Wt 122 lb 12.8 oz (55.7 kg)   BMI 23.20 kg/m  She  status post wide local excision of the left breast.  Cosmetic result is good to excellent.  No dominant masses noted in either breast no axillary or supraclavicular adenopathy is identified.  Well-developed well-nourished patient in NAD. HEENT reveals PERLA, EOMI, discs not visualized.  Oral cavity is clear. No oral mucosal lesions are identified. Neck is clear without evidence of cervical or supraclavicular adenopathy. Lungs are clear to A&P. Cardiac examination is essentially unremarkable with regular rate and rhythm without murmur rub or thrill. Abdomen is benign with no organomegaly or masses noted. Motor sensory and DTR levels are equal and symmetric in the upper and lower extremities. Cranial nerves II through XII are grossly intact. Proprioception is intact. No peripheral adenopathy or edema is identified. No motor or sensory levels are noted. Crude visual fields are within normal range.  LABORATORY DATA: Pathology reports reviewed    RADIOLOGY RESULTS: Mammogram ultrasound reviewed compatible with above-stated findings.  CT scan was reviewed showing lumpectomy cavity no other evidence of disease although this has not been formally read this is my interpretation.   IMPRESSION: Plan   ER negative DCIS of the left breast status post wide local excision with close margins at 13 mm in 67 year old female this time I had a long discussion with the patient regarding reexcision.  I have explained to her risk of local recurrence is somewhat increased I would put around 5% with radiation therapy.  She is not inclined to go through reexcision again and I have offered whole breast radiation plus high dose boost to her lumpectomy site.  Being that this is DCIS I do not think to slow affect at all her overall survival although again slight increase in local regional recurrence.  Patient will be seeing medical oncology as well as returning to see Dr. Tonna Boehringer and they will also give her their opinion as to rationale for  reexcision.  I have tentatively set her up for simulation and treatment planning after the treatment break.  Risks and benefits of radiation therapy including skin reaction fatigue alteration blood counts possible inclusion of superficial lung all were explained in detail to the patient.  She will not benefit from endocrine therapy based on the negative ER status.  Patient and husband both comprehend my treatment plan and recommendations well.  I would like to take this opportunity to thank you for allowing me to participate in the care of your patient.Carmina Miller, MD

## 2022-12-19 NOTE — Assessment & Plan Note (Addendum)
Multi focal High grade left breast ER negative DCIS s/p lumpectomy, positive margin s/p re-excision - positive margin [minimal at posterior margin] as well as close margin <60mm [extensive extent at anterior margin, and focal at inferior margin]. Discussed with surgery and pathology.  Per Dr. Tonna Boehringer, based on his intraoperative impression, the positive posterior margin noted on lastest report likely correlates the to anterior margin of original lumpectomy specimen,therefore this is not a true margin. .  I had a lengthy discussion with patient and discussed with her about pathology report.  she still has close margin <41mm.  We reviewed the recommendation from Society of ??rgi??l Oncology, American Society for Radiation Oncology, and American Society of Clinical Oncology of a 2 mm margin as the standard for patients with DCIS treated with breast conservative surgery followed by whole breast irradiation.  I recommend patient to further discuss with surgeon regarding feasibility of repeat excision to achieve adequate margin or mastectomy. If additional surgery is felt to be not feasible or if patient declines additional surgery, recommend adjuvant radiation.   ER is negative, so no endocrinotherapy needed.  Recommend annual mammogram. If Dr. Geoffery Lyons office plans to order her surveillance mammogram, she can follow up with me PRN.

## 2022-12-19 NOTE — Assessment & Plan Note (Signed)
Negative genetic testing

## 2022-12-19 NOTE — Progress Notes (Signed)
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 Discussed with surgery and pathology.  Per Dr. Tonna Boehringer, based on his intraoperative impression, the positive posterior margin noted on lastest report likely correlates the to anterior margin of original lumpectomy specimen,therefore this is not a true margin. .  I had a lengthy discussion with patient and discussed with her about pathology report.  she still has close margin <21mm.  We reviewed the recommendation from Society of ??rgi??l Oncology, American Society for Radiation Oncology, and American Society of Clinical Oncology of a 2 mm margin as the standard for patients with DCIS treated with breast conservative surgery followed by whole breast irradiation.  I recommend patient to further discuss with surgeon regarding feasibility of repeat excision to achieve adequate margin or mastectomy. If additional surgery is felt to be not feasible or if patient declines additional surgery, recommend adjuvant radiation.   ER is negative, so no endocrinotherapy needed.  Recommend annual mammogram. If Dr. Geoffery Lyons office plans to order her surveillance mammogram, she can follow up with me PRN.     Family history of cancer Negative genetic testing.    No orders of the defined types were placed in this encounter.  Follow up PRN All questions were answered. The patient knows to call the clinic with any problems, questions or concerns.  Rickard Patience, MD, PhD [facility] Hematology Oncology 12/19/2022     HISTORY OF PRESENTING ILLNESS:  Ruth Morgan 67 y.o. female presents to establish care for left breast DCIS I have reviewed her chart and materials related to her cancer extensively and collaborated history with the patient. Summary of oncologic history is as follows: Oncology History  DCIS (ductal carcinoma in situ)  09/12/2022 Imaging   Bilateral screening mammogram  In the left breast, a possible mass with architectural distortion warrants further evaluation. In the right breast, no findings suspicious for malignancy.    09/21/2022 Mammogram   Unilateral left diagnostic mammogram and [place]  1. Developing asymmetry central, slightly medial aspect of the left breast, without associated mass or convincing distortion. Tissue sampling is recommended.   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 Breast, left, needle core biopsy, UIQ, ribbon clip :  - DUCTAL CARCINOMA IN SITU,  APOCRINE AND SECRETORY FEATURES, HIGH GRADE (3)  - NECROSIS: NOT IDENTIFIED  - CALCIFICATIONS: NOT IDENTIFIED  - DCIS LENGTH: 0.3 CM   Patient reports family history cancer.   Menarche at age of 88 or 67 yo First live birth at age of 11 OCP use: no  History of hysterectomy: yes, ovaries removed. Menopausal status: postmenopausal History of HRT use for 5 years History of chest radiation: no  Number of previous breast biopsies: no    10/10/2022 Cancer Staging   Staging form: Breast, AJCC 8th Edition - Clinical stage from 10/10/2022: Stage 0 (cTis (DCIS), cN0, cM0) - Signed by Rickard Patience, MD on 10/10/2022 Stage prefix: Initial diagnosis    Genetic Testing   No pathogenic variants identified on the [facility] Multi-Cancer+RNA panel. VUS in MSH6 called [id number] (p.Lys374del)  identified.  The report date is 10/20/2022.  The Multi-Cancer + RNA Panel offered by [facility] includes sequencing and/or deletion/duplication analysis of the following 70 genes:  AIP*, ALK, APC*,  ATM*, AXIN2*, BAP1*, BARD1*, BLM*, BMPR1A*, BRCA1*, BRCA2*, BRIP1*, CDC73*, CDH1*, CDK4, CDKN1B*, CDKN2A, CHEK2*, CTNNA1*, DICER1*, EPCAM, EGFR, FH*, FLCN*, GREM1, HOXB13, KIT, LZTR1, MAX*, MBD4, MEN1*, MET, MITF, MLH1*, MSH2*, MSH3*, MSH6*, MUTYH*, NF1*, NF2*, NTHL1*, PALB2*, PDGFRA, PMS2*, POLD1*, POLE*, POT1*, PRKAR1A*, PTCH1*, PTEN*, RAD51C*, RAD51D*, RB1*, RET, SDHA*, SDHAF2*, SDHB*, SDHC*, SDHD*, SMAD4*, SMARCA4*, SMARCB1*, SMARCE1*, STK11*, SUFU*, TMEM127*, TP53*, TSC1*, TSC2*, VHL*. RNA analysis is performed for * genes.   11/01/2022 Surgery   S/p left breast lumpectomy with SLNB  1. Breast, lumpectomy, left mass :      - DUCTAL CARCINOMA IN SITU (DCIS).      - SEE CANCER SUMMARY AND NOTE BELOW.      - PRIOR BIOPSY SITE CHANGE WITH ASSOCIATED CLIP.      - SCOUT TAG PRESENT.  2. Lymph node, sentinel, biopsy, Left axillary #1 :      - ONE LYMPH NODE NEGATIVE FOR MALIGNANCY (0/1).  3. Breast, lumpectomy, left superior margin :      - RESIDUAL DUCTAL CARCINOMA IN SITU (DCIS); NEW SUPERIOR MARGIN IS NEGATIVE.  Diagnosis Note : In the main lumpectomy specimen (part 1) cauterized DCIS  extends to the superior and lateral margins.The size of DCIS is estimated to  measure at least 49 mm since the specimen measured 50 mm medial to lateral, the lateral margin is positive, and DCIS is 1 mm from the medial margin. In the re-excised superior margin specimen (part 3) residual DCIS, measuring atleast 5 mm, is present along the inferior aspect of the specimen corresponding to the superior margin of the main lumpectomy specimen.  The staging summary reflects the final margin status and incorporates the findings from the prior biopsy as well as the current three specimens.  TUMOR Histologic Type: Ductal carcinoma in situ Size (Extent) of DCIS:  Estimated size (extent) of DCIS is at least 49 mm Nuclear Grade: Grade 3 (high) Necrosis: Present, central (expansive "comedo" necrosis)  MARGINS Margin  Status: DCIS is present at margin: Lateral, unifocal DCIS is within 1 mm to anterior margin DCIS is 1 mm from medial and inferior margins  REGIONAL LYMPH NODES Regional Lymph node Status: All regional lymph nodes negative for tumor Total Number of Lymph Nodes Examined (sentinel and non-sentinel): 1 Number of Sentinel Nodes Examined: 1   ER 0%   12/06/2022 Surgery   S/p re-excision.   1. Breast, lumpectomy, left, anterior lateral margin :      - RESIDUAL DUCTAL CARCINOMA IN SITU (DCIS).      - CHANGES CONSISTENT WITH PRIOR SURGICAL PROCEDURE.      - SEE NOTE FOR FINAL MARGIN STATUS.  2. Breast, lumpectomy, left, inferior margin :      - RESIDUAL DUCTAL CARCINOMA IN SITU (DCIS).      - CHANGES CONSISTENT WITH PRIOR SURGICAL PROCEDURE.      - SEE NOTE FOR FINAL MARGIN STATUS.  Diagnosis Note : Multifocal residual high-grade ductal carcinoma in situ (DCIS)  is present in both of the above specimens.Based on the provided orientation of the current specimens and the prior lumpectomy specimen the final margins status is:    DCIS is at the posterior margin (minimal extent)    DCIS is within 1 mm of the anterior margin (extensive extent)    DCIS is within 1 mm of the inferior margin (  unifocal)     CORRECTED [id number]: ADDENDUM: After the sign-out of this case, the margin status was discussed with Dr. Tonna Boehringer on 12/11/2022. Based on his intraoperative impression of the orientation of the re-excised "left anterior lateral margin" (specimen 1) in relation to the prior lumpectomy site, he believes the positive posterior margin in the re-excision corresponds to the "old' anterior lumpectomy margin and is thus not a "true" margin. The interpretation of the remaining margins is unchanged (DCIS is within 1 mm of the anterior and inferior margins).       MEDICAL HISTORY:  Past Medical History:  Diagnosis Date   ADHD (attention deficit hyperactivity disorder)    a.) on  amphetamine-dextroamphetamine   Angina at rest [facility])    Anxiety state, unspecified 12/05/2012   Asthma    Bilateral inguinal hernia 04/2022   CAD (coronary artery disease) 03/23/2021   a.) MPI 03/01/2021: no ischemia; b.) cCTA 03/23/2021: Ca2+ 1.22 (48th %ile) in RCA; c.) cCTA 07/26/2022: Ca2+ 25.5 (58th %ile) in RCA distribution   Carpal tunnel syndrome    a.) s/p release on the RIGHT   COPD (chronic obstructive pulmonary disease) (HCC)    DDD (degenerative disc disease), lumbar    Diastolic dysfunction 03/01/2021   a.) TTE 03/01/2021: EF 50%, triv MR/PR, mild TR, mod AR, G1DD   Diverticulosis of colon    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   GERD (gastroesophageal reflux disease)    Headache    History of 2019 novel coronavirus disease (COVID-19) 2021   History of bilateral cataract extraction 2016   History of shingles 2013   Hyperlipidemia    Hypertension    Orthostatic hypotension    Prediabetes 12/05/2012   Shingles 06/25/2017   Shortness of breath on exertion    Tubular adenoma of colon 2010    SURGICAL HISTORY: Past Surgical History:  Procedure Laterality Date   ABDOMINAL HYSTERECTOMY     APPENDECTOMY  1972   BREAST BIOPSY Left 2016   needle bx /clip neg   BREAST BIOPSY Left 10/05/2022   stereo bx, asymmetry, X clip - pathology (+) for DCIS   BREAST BIOPSY Left 10/05/2022   MM LT BREAST BX W LOC DEV 1ST LESION IMAGE BX SPEC STEREO GUIDE 10/05/2022 [facility]   BREAST LUMPECTOMY Left 12/06/2022   Procedure: BREAST LUMPECTOMY;  Surgeon: Sung Amabile, DO;  Location: [facility];  Service: General;  Laterality: Left;  re-excision of margins   BROW LIFT Bilateral 04/16/2017   Procedure: BLEPHAROPLASTY UPPER EYELID: W/EXCESS SKIN;  Surgeon: Imagene Riches, MD;  Location: [facility];  Service: Ophthalmology;  Laterality: Bilateral;   CARPAL TUNNEL RELEASE Right 2014   CATARACT EXTRACTION W/ INTRAOCULAR LENS IMPLANT Right 05/04/2014    CATARACT EXTRACTION W/ INTRAOCULAR LENS IMPLANT Left 06/09/2014   COLONOSCOPY N/A 06/02/2021   Procedure: COLONOSCOPY;  Surgeon: Jaynie Collins, DO;  Location: [facility];  Service: Gastroenterology;  Laterality: N/A;   COLONOSCOPY     2016, 2022   FINGER AMPUTATION Left    ring finger   INSERTION OF MESH Bilateral 04/27/2022   Procedure: INSERTION OF MESH;  Surgeon: Sung Amabile, DO;  Location: [facility];  Service: General;  Laterality: Bilateral;   LASIK  2006   NASAL SINUS SURGERY  2004   PART MASTECTOMY,RADIO FREQUENCY LOCALIZER,AXILLARY SENTINEL NODE BIOPSY Left 11/01/2022   Procedure: PART MASTECTOMY,RADIO FREQUENCY LOCALIZER,AXILLARY SENTINEL NODE BIOPSY;  Surgeon: Sung Amabile, DO;  Location: [facility];  Service: General;  Laterality: Left;  TOTAL ABDOMINAL HYSTERECTOMY W/ BILATERAL SALPINGOOPHORECTOMY  1985   benign ovarian tumors    SOCIAL HISTORY: Social History   Socioeconomic History   Marital status: Divorced    Spouse name: Not on file   Number of children: 1   Years of education: Not on file   Highest education level: Not on file  Occupational History   Not on file  Tobacco Use   Smoking status: Former    Current packs/day: 0.00    Average packs/day: 0.8 packs/day for 46.0 years (34.5 ttl pk-yrs)    Types: Cigarettes    Start date: 12/1975    Quit date: 12/2021    Years since quitting: 1.0   Smokeless tobacco: Never   Tobacco comments:    .5 ppd currently  Vaping Use   Vaping status: Never Used  Substance and Sexual Activity   Alcohol use: Yes    Comment: rare   Drug use: No   Sexual activity: Yes  Other Topics Concern   Not on file  Social History Narrative   Lives with boyfriend   Social Drivers of Health   Financial Resource Strain: Low Risk  (10/10/2022)   Overall Financial Resource Strain (CARDIA)    Difficulty of Paying Living Expenses: Not very hard  Food Insecurity: No Food Insecurity (10/10/2022)   Hunger Vital Sign    Worried  About Running Out of Food in the Last Year: Never true    Ran Out of Food in the Last Year: Never true  Transportation Needs: No Transportation Needs (10/10/2022)   PRAPARE - Administrator, Civil Service (Medical): No    Lack of Transportation (Non-Medical): No  Physical Activity: Not on file  Stress: Not on file  Social Connections: Not on file  Intimate Partner Violence: Not At Risk (10/10/2022)   Humiliation, Afraid, Rape, and Kick questionnaire    Fear of Current or Ex-Partner: No    Emotionally Abused: No    Physically Abused: No    Sexually Abused: No    FAMILY HISTORY: Family History  Problem Relation Age of Onset   Hypertension Mother    Stomach cancer Father        d. early 40s   CAD Father 38       MI   Kidney failure Father        ESRD on HD   Hyperlipidemia Father    Diabetes Sister    CAD Sister        MI   Diabetes Brother    CAD Brother        MI   Lung cancer Paternal Uncle    Lung cancer Paternal Uncle    Ovarian cancer Paternal Grandmother        d. 29s   Lung cancer Paternal Grandfather        d.>50   Breast cancer Niece 55    ALLERGIES:  is allergic to lisinopril.  MEDICATIONS:  Current Outpatient Medications  Medication Sig Dispense Refill   acetaminophen (TYLENOL) 325 MG tablet Take 650 mg by mouth every 6 (six) hours as needed for mild pain (pain score 1-3).     albuterol (VENTOLIN HFA) 108 (90 Base) MCG/ACT inhaler Inhale 2 puffs into the lungs every 6 (six) hours as needed for wheezing or shortness of breath. 18 g 0   amphetamine-dextroamphetamine (ADDERALL) 20 MG tablet Take 30 mg by mouth 2 (two) times daily.     Budeson-Glycopyrrol-Formoterol (BREZTRI AEROSPHERE) 160-9-4.8 MCG/ACT AERO Inhale 2  puffs into the lungs in the morning and at bedtime.     Cholecalciferol (VITAMIN D-3) 25 MCG (1000 UT) CAPS Take 100 Units by mouth daily.     diltiazem (CARDIZEM CD) 120 MG 24 hr capsule Take 120 mg by mouth at bedtime.     MULTIPLE  VITAMIN PO Take 1 tablet by mouth daily.     rosuvastatin (CRESTOR) 20 MG tablet Take 20 mg by mouth at bedtime.     No current facility-administered medications for this visit.    Review of Systems  Constitutional:  Negative for appetite change, chills, fatigue and fever.  HENT:   Negative for hearing loss and voice change.   Eyes:  Negative for eye problems.  Respiratory:  Negative for chest tightness and cough.   Cardiovascular:  Negative for chest pain.  Gastrointestinal:  Negative for abdominal distention, abdominal pain and blood in stool.  Endocrine: Negative for hot flashes.  Genitourinary:  Negative for difficulty urinating and frequency.   Musculoskeletal:  Negative for arthralgias.  Skin:  Negative for itching and rash.  Neurological:  Negative for extremity weakness.  Hematological:  Negative for adenopathy.  Psychiatric/Behavioral:  Negative for confusion.      PHYSICAL EXAMINATION: ECOG PERFORMANCE STATUS: 0 - Asymptomatic  Vitals:   12/19/22 1423  BP: (!) 158/89  Pulse: 88  Resp: 18  Temp: 97.6 F (36.4 C)   Filed Weights   12/19/22 1423  Weight: 122 lb 4.8 oz (55.5 kg)    Physical Exam Constitutional:      General: She is not in acute distress.    Appearance: She is not diaphoretic.  HENT:     Head: Normocephalic and atraumatic.  Eyes:     General: No scleral icterus. Cardiovascular:     Rate and Rhythm: Normal rate and regular rhythm.     Heart sounds: No murmur heard. Pulmonary:     Effort: Pulmonary effort is normal. No respiratory distress.     Breath sounds: No wheezing.  Abdominal:     General: There is no distension.     Palpations: Abdomen is soft.     Tenderness: There is no abdominal tenderness.  Musculoskeletal:        General: Normal range of motion.     Cervical back: Normal range of motion and neck supple.  Skin:    General: Skin is warm and dry.     Findings: No erythema.  Neurological:     Mental Status: She is alert and  oriented to person, place, and time. Mental status is at baseline.     Cranial Nerves: No cranial nerve deficit.     Motor: No abnormal muscle tone.  Psychiatric:        Mood and Affect: Mood and affect normal.     Breast exam was performed in seated and lying down position. Patient is status post left breast biopsy, with focal skin bruising and tissue swelling.  No palpable breast masses bilaterally.  No palpable axillary adenopathy bilaterally.  LABORATORY DATA:  I have reviewed the data as listed    Latest Ref Rng & Units 10/10/2022    3:36 PM 04/24/2022    3:01 PM 09/21/2020    3:48 PM  CBC  WBC 4.0 - 10.5 K/uL 8.0  7.1  8.4   Hemoglobin 12.0 - 15.0 g/dL 40.9  81.1  91.4   Hematocrit 36.0 - 46.0 % 45.2  43.2  44.9   Platelets 150 - 400 K/uL 379  401  404       Latest Ref Rng & Units 10/10/2022    3:36 PM 04/24/2022    3:01 PM 09/21/2020    3:48 PM  CMP  Glucose 70 - 99 mg/dL 409  811  914   BUN 8 - 23 mg/dL 11  14  14    Creatinine 0.44 - 1.00 mg/dL 7.82  9.56  2.13   Sodium 135 - 145 mmol/L 139  136  138   Potassium 3.5 - 5.1 mmol/L 4.5  4.3  4.9   Chloride 98 - 111 mmol/L 106  102  103   CO2 22 - 32 mmol/L 26  27  26    Calcium 8.9 - 10.3 mg/dL 9.6  9.2  9.6   Total Protein 6.5 - 8.1 g/dL 7.9     Total Bilirubin 0.3 - 1.2 mg/dL 0.7     Alkaline Phos 38 - 126 U/L 75     AST 15 - 41 U/L 20     ALT 0 - 44 U/L 21        RADIOGRAPHIC STUDIES: I have personally reviewed the radiological images as listed and agreed with the findings in the report. No results found.

## 2022-12-21 ENCOUNTER — Telehealth: Payer: Self-pay | Admitting: *Deleted

## 2022-12-21 NOTE — Telephone Encounter (Signed)
Opened in error

## 2022-12-24 ENCOUNTER — Institutional Professional Consult (permissible substitution): Payer: Medicare Other | Admitting: Radiation Oncology

## 2022-12-25 ENCOUNTER — Other Ambulatory Visit: Payer: Self-pay | Admitting: Radiation Oncology

## 2022-12-25 DIAGNOSIS — C801 Malignant (primary) neoplasm, unspecified: Secondary | ICD-10-CM

## 2022-12-28 ENCOUNTER — Ambulatory Visit
Admission: RE | Admit: 2022-12-28 | Discharge: 2022-12-28 | Disposition: A | Discharge status: Home / Self Care | Payer: Medicare Other | Source: Ambulatory Visit | Attending: Radiation Oncology | Admitting: Radiation Oncology

## 2022-12-28 ENCOUNTER — Encounter: Payer: Self-pay | Admitting: *Deleted

## 2022-12-28 DIAGNOSIS — D0512 Intraductal carcinoma in situ of left breast: Secondary | ICD-10-CM | POA: Insufficient documentation

## 2022-12-28 DIAGNOSIS — C801 Malignant (primary) neoplasm, unspecified: Secondary | ICD-10-CM

## 2022-12-31 DIAGNOSIS — D0512 Intraductal carcinoma in situ of left breast: Secondary | ICD-10-CM | POA: Diagnosis not present

## 2023-01-04 ENCOUNTER — Other Ambulatory Visit: Payer: Self-pay | Admitting: *Deleted

## 2023-01-04 DIAGNOSIS — C50912 Malignant neoplasm of unspecified site of left female breast: Secondary | ICD-10-CM

## 2023-01-07 ENCOUNTER — Ambulatory Visit: Admission: RE | Admit: 2023-01-07 | Payer: Medicare Other | Source: Ambulatory Visit

## 2023-01-07 DIAGNOSIS — D0512 Intraductal carcinoma in situ of left breast: Secondary | ICD-10-CM | POA: Diagnosis present

## 2023-01-08 ENCOUNTER — Ambulatory Visit: Payer: Medicare Other

## 2023-01-09 ENCOUNTER — Ambulatory Visit
Admission: RE | Admit: 2023-01-09 | Discharge: 2023-01-09 | Disposition: A | Discharge status: Home / Self Care | Payer: Medicare Other | Source: Ambulatory Visit | Attending: Radiation Oncology | Admitting: Radiation Oncology

## 2023-01-09 ENCOUNTER — Other Ambulatory Visit: Payer: Self-pay

## 2023-01-09 DIAGNOSIS — D0512 Intraductal carcinoma in situ of left breast: Secondary | ICD-10-CM | POA: Diagnosis not present

## 2023-01-09 LAB — RAD ONC ARIA SESSION SUMMARY
Course Elapsed Days: 0
Plan Fractions Treated to Date: 1
Plan Prescribed Dose Per Fraction: 2.66 Gy
Plan Total Fractions Prescribed: 16
Plan Total Prescribed Dose: 42.56 Gy
Reference Point Dosage Given to Date: 2.66 Gy
Reference Point Session Dosage Given: 2.66 Gy
Session Number: 1

## 2023-01-10 ENCOUNTER — Other Ambulatory Visit: Payer: Self-pay

## 2023-01-10 ENCOUNTER — Ambulatory Visit
Admission: RE | Admit: 2023-01-10 | Discharge: 2023-01-10 | Disposition: A | Discharge status: Home / Self Care | Payer: Medicare Other | Source: Ambulatory Visit | Attending: Radiation Oncology | Admitting: Radiation Oncology

## 2023-01-10 DIAGNOSIS — D0512 Intraductal carcinoma in situ of left breast: Secondary | ICD-10-CM | POA: Diagnosis not present

## 2023-01-10 LAB — RAD ONC ARIA SESSION SUMMARY
Course Elapsed Days: 1
Plan Fractions Treated to Date: 2
Plan Prescribed Dose Per Fraction: 2.66 Gy
Plan Total Fractions Prescribed: 16
Plan Total Prescribed Dose: 42.56 Gy
Reference Point Dosage Given to Date: 5.32 Gy
Reference Point Session Dosage Given: 2.66 Gy
Session Number: 2

## 2023-01-11 ENCOUNTER — Other Ambulatory Visit: Payer: Self-pay

## 2023-01-11 ENCOUNTER — Ambulatory Visit
Admission: RE | Admit: 2023-01-11 | Discharge: 2023-01-11 | Disposition: A | Discharge status: Home / Self Care | Payer: Medicare Other | Source: Ambulatory Visit | Attending: Radiation Oncology | Admitting: Radiation Oncology

## 2023-01-11 DIAGNOSIS — D0512 Intraductal carcinoma in situ of left breast: Secondary | ICD-10-CM | POA: Diagnosis not present

## 2023-01-11 LAB — RAD ONC ARIA SESSION SUMMARY
Course Elapsed Days: 2
Plan Fractions Treated to Date: 3
Plan Prescribed Dose Per Fraction: 2.66 Gy
Plan Total Fractions Prescribed: 16
Plan Total Prescribed Dose: 42.56 Gy
Reference Point Dosage Given to Date: 7.98 Gy
Reference Point Session Dosage Given: 2.66 Gy
Session Number: 3

## 2023-01-14 ENCOUNTER — Other Ambulatory Visit: Payer: Medicare Other

## 2023-01-14 ENCOUNTER — Inpatient Hospital Stay: Payer: Medicare Other | Attending: Oncology

## 2023-01-14 ENCOUNTER — Other Ambulatory Visit: Payer: Self-pay

## 2023-01-14 ENCOUNTER — Ambulatory Visit
Admission: RE | Admit: 2023-01-14 | Discharge: 2023-01-14 | Disposition: A | Discharge status: Home / Self Care | Payer: Medicare Other | Source: Ambulatory Visit | Attending: Radiation Oncology | Admitting: Radiation Oncology

## 2023-01-14 DIAGNOSIS — D0512 Intraductal carcinoma in situ of left breast: Secondary | ICD-10-CM | POA: Insufficient documentation

## 2023-01-14 LAB — RAD ONC ARIA SESSION SUMMARY
Course Elapsed Days: 5
Plan Fractions Treated to Date: 4
Plan Prescribed Dose Per Fraction: 2.66 Gy
Plan Total Fractions Prescribed: 16
Plan Total Prescribed Dose: 42.56 Gy
Reference Point Dosage Given to Date: 10.64 Gy
Reference Point Session Dosage Given: 2.66 Gy
Session Number: 4

## 2023-01-14 NOTE — Progress Notes (Signed)
 Tumor Board Documentation  Ruth Morgan was presented by Dr. Babara at our Tumor Board on 01/14/2023, which included representatives from medical oncology, radiation oncology, surgical, radiology, pathology, navigation, research, palliative care.  Ruth Morgan currently presents as a current patient with history of the following treatments: surgical intervention(s), adjuvant radiation.  Additionally, we reviewed previous medical and familial history, history of present illness, and recent lab results along with all available histopathologic and imaging studies. The tumor board considered available treatment options and made the following recommendations: Adjuvant radiation    The following procedures/referrals were also placed: No orders of the defined types were placed in this encounter.   Clinical Trial Status:     Staging used: Pathologic Stage AJCC Staging: T: Tis          National site-specific guidelines NCCN were discussed with respect to the case.  Tumor board is a meeting of clinicians from various specialty areas who evaluate and discuss patients for whom a multidisciplinary approach is being considered. Final determinations in the plan of care are those of the provider(s). The responsibility for follow up of recommendations given during tumor board is that of the provider.   Today's extended care, comprehensive team conference, Ruth Morgan was not present for the discussion and was not examined.   Multidisciplinary Tumor Board is a multidisciplinary case peer review process.  Decisions discussed in the Multidisciplinary Tumor Board reflect the opinions of the specialists present at the conference without having examined the patient.  Ultimately, treatment and diagnostic decisions rest with the primary provider(s) and the patient.

## 2023-01-15 ENCOUNTER — Other Ambulatory Visit: Payer: Self-pay

## 2023-01-15 ENCOUNTER — Ambulatory Visit
Admission: RE | Admit: 2023-01-15 | Discharge: 2023-01-15 | Disposition: A | Discharge status: Home / Self Care | Payer: Medicare Other | Source: Ambulatory Visit | Attending: Radiation Oncology | Admitting: Radiation Oncology

## 2023-01-15 DIAGNOSIS — D0512 Intraductal carcinoma in situ of left breast: Secondary | ICD-10-CM | POA: Diagnosis not present

## 2023-01-15 LAB — RAD ONC ARIA SESSION SUMMARY
Course Elapsed Days: 6
Plan Fractions Treated to Date: 5
Plan Prescribed Dose Per Fraction: 2.66 Gy
Plan Total Fractions Prescribed: 16
Plan Total Prescribed Dose: 42.56 Gy
Reference Point Dosage Given to Date: 13.3 Gy
Reference Point Session Dosage Given: 2.66 Gy
Session Number: 5

## 2023-01-16 ENCOUNTER — Ambulatory Visit
Admission: RE | Admit: 2023-01-16 | Discharge: 2023-01-16 | Disposition: A | Discharge status: Home / Self Care | Payer: Medicare Other | Source: Ambulatory Visit | Attending: Radiation Oncology | Admitting: Radiation Oncology

## 2023-01-16 ENCOUNTER — Other Ambulatory Visit: Payer: Self-pay

## 2023-01-16 ENCOUNTER — Inpatient Hospital Stay: Payer: Medicare Other | Admitting: Occupational Therapy

## 2023-01-16 DIAGNOSIS — D0512 Intraductal carcinoma in situ of left breast: Secondary | ICD-10-CM | POA: Diagnosis not present

## 2023-01-16 LAB — RAD ONC ARIA SESSION SUMMARY
Course Elapsed Days: 7
Plan Fractions Treated to Date: 6
Plan Prescribed Dose Per Fraction: 2.66 Gy
Plan Total Fractions Prescribed: 16
Plan Total Prescribed Dose: 42.56 Gy
Reference Point Dosage Given to Date: 15.96 Gy
Reference Point Session Dosage Given: 2.66 Gy
Session Number: 6

## 2023-01-17 ENCOUNTER — Other Ambulatory Visit
Admission: RE | Admit: 2023-01-17 | Discharge: 2023-01-17 | Disposition: A | Discharge status: Home / Self Care | Payer: Medicare Other | Source: Ambulatory Visit | Attending: Internal Medicine | Admitting: Internal Medicine

## 2023-01-17 ENCOUNTER — Other Ambulatory Visit: Payer: Self-pay | Admitting: Internal Medicine

## 2023-01-17 ENCOUNTER — Ambulatory Visit
Admission: RE | Admit: 2023-01-17 | Discharge: 2023-01-17 | Disposition: A | Discharge status: Home / Self Care | Payer: Medicare Other | Source: Ambulatory Visit | Attending: Internal Medicine | Admitting: Internal Medicine

## 2023-01-17 ENCOUNTER — Other Ambulatory Visit: Payer: Self-pay

## 2023-01-17 ENCOUNTER — Ambulatory Visit
Admission: RE | Admit: 2023-01-17 | Discharge: 2023-01-17 | Disposition: A | Discharge status: Home / Self Care | Payer: Medicare Other | Source: Ambulatory Visit | Attending: Radiation Oncology | Admitting: Radiation Oncology

## 2023-01-17 DIAGNOSIS — D0512 Intraductal carcinoma in situ of left breast: Secondary | ICD-10-CM | POA: Diagnosis not present

## 2023-01-17 DIAGNOSIS — R0602 Shortness of breath: Secondary | ICD-10-CM | POA: Insufficient documentation

## 2023-01-17 LAB — RAD ONC ARIA SESSION SUMMARY
Course Elapsed Days: 8
Plan Fractions Treated to Date: 7
Plan Prescribed Dose Per Fraction: 2.66 Gy
Plan Total Fractions Prescribed: 16
Plan Total Prescribed Dose: 42.56 Gy
Reference Point Dosage Given to Date: 18.62 Gy
Reference Point Session Dosage Given: 2.66 Gy
Session Number: 7

## 2023-01-17 LAB — BRAIN NATRIURETIC PEPTIDE: B Natriuretic Peptide: 21.4 pg/mL (ref 0.0–100.0)

## 2023-01-17 LAB — D-DIMER, QUANTITATIVE: D-Dimer, Quant: <0.27 ug{FEU}/mL (ref 0.00–0.50)

## 2023-01-17 MED ORDER — IOHEXOL 350 MG/ML SOLN
75.0000 mL | Freq: Once | INTRAVENOUS | Status: AC | PRN
  Administered 2023-01-17: 75 mL via INTRAVENOUS

## 2023-01-18 ENCOUNTER — Ambulatory Visit
Admission: RE | Admit: 2023-01-18 | Discharge: 2023-01-18 | Disposition: A | Discharge status: Home / Self Care | Payer: Medicare Other | Source: Ambulatory Visit | Attending: Radiation Oncology | Admitting: Radiation Oncology

## 2023-01-18 ENCOUNTER — Other Ambulatory Visit: Payer: Self-pay

## 2023-01-18 DIAGNOSIS — D0512 Intraductal carcinoma in situ of left breast: Secondary | ICD-10-CM | POA: Diagnosis not present

## 2023-01-18 LAB — RAD ONC ARIA SESSION SUMMARY
Course Elapsed Days: 9
Plan Fractions Treated to Date: 8
Plan Prescribed Dose Per Fraction: 2.66 Gy
Plan Total Fractions Prescribed: 16
Plan Total Prescribed Dose: 42.56 Gy
Reference Point Dosage Given to Date: 21.28 Gy
Reference Point Session Dosage Given: 2.66 Gy
Session Number: 8

## 2023-01-21 ENCOUNTER — Ambulatory Visit
Admission: RE | Admit: 2023-01-21 | Discharge: 2023-01-21 | Disposition: A | Discharge status: Home / Self Care | Payer: Medicare Other | Source: Ambulatory Visit | Attending: Radiation Oncology | Admitting: Radiation Oncology

## 2023-01-21 ENCOUNTER — Inpatient Hospital Stay: Payer: Medicare Other

## 2023-01-21 ENCOUNTER — Other Ambulatory Visit: Payer: Self-pay

## 2023-01-21 DIAGNOSIS — D0512 Intraductal carcinoma in situ of left breast: Secondary | ICD-10-CM | POA: Diagnosis not present

## 2023-01-21 LAB — RAD ONC ARIA SESSION SUMMARY
Course Elapsed Days: 12
Plan Fractions Treated to Date: 9
Plan Prescribed Dose Per Fraction: 2.66 Gy
Plan Total Fractions Prescribed: 16
Plan Total Prescribed Dose: 42.56 Gy
Reference Point Dosage Given to Date: 23.94 Gy
Reference Point Session Dosage Given: 2.66 Gy
Session Number: 9

## 2023-01-21 NOTE — Progress Notes (Signed)
[facility] CSW Progress Note  Clinical Child psychotherapist returned patient's call.  She stated she would like to schedule an appointment with CSW.  It is scheduled for 10am tomorrow morning.  Patient expressed no other issues.   Dorothey Baseman, LCSW Clinical Social Worker [facility]

## 2023-01-22 ENCOUNTER — Other Ambulatory Visit: Payer: Medicare Other

## 2023-01-22 ENCOUNTER — Ambulatory Visit
Admission: RE | Admit: 2023-01-22 | Discharge: 2023-01-22 | Disposition: A | Discharge status: Home / Self Care | Payer: Medicare Other | Source: Ambulatory Visit | Attending: Radiation Oncology | Admitting: Radiation Oncology

## 2023-01-22 ENCOUNTER — Other Ambulatory Visit: Payer: Self-pay

## 2023-01-22 DIAGNOSIS — D0512 Intraductal carcinoma in situ of left breast: Secondary | ICD-10-CM | POA: Diagnosis not present

## 2023-01-22 LAB — RAD ONC ARIA SESSION SUMMARY
Course Elapsed Days: 13
Plan Fractions Treated to Date: 10
Plan Prescribed Dose Per Fraction: 2.66 Gy
Plan Total Fractions Prescribed: 16
Plan Total Prescribed Dose: 42.56 Gy
Reference Point Dosage Given to Date: 26.6 Gy
Reference Point Session Dosage Given: 2.66 Gy
Session Number: 10

## 2023-01-23 ENCOUNTER — Ambulatory Visit
Admission: RE | Admit: 2023-01-23 | Discharge: 2023-01-23 | Disposition: A | Discharge status: Home / Self Care | Payer: Medicare Other | Source: Ambulatory Visit | Attending: Radiation Oncology | Admitting: Radiation Oncology

## 2023-01-23 ENCOUNTER — Other Ambulatory Visit: Payer: Self-pay

## 2023-01-23 DIAGNOSIS — D0512 Intraductal carcinoma in situ of left breast: Secondary | ICD-10-CM | POA: Diagnosis not present

## 2023-01-23 LAB — RAD ONC ARIA SESSION SUMMARY
Course Elapsed Days: 14
Plan Fractions Treated to Date: 11
Plan Prescribed Dose Per Fraction: 2.66 Gy
Plan Total Fractions Prescribed: 16
Plan Total Prescribed Dose: 42.56 Gy
Reference Point Dosage Given to Date: 29.26 Gy
Reference Point Session Dosage Given: 2.66 Gy
Session Number: 11

## 2023-01-24 ENCOUNTER — Other Ambulatory Visit: Payer: Self-pay

## 2023-01-24 ENCOUNTER — Ambulatory Visit
Admission: RE | Admit: 2023-01-24 | Discharge: 2023-01-24 | Disposition: A | Discharge status: Home / Self Care | Payer: Medicare Other | Source: Ambulatory Visit | Attending: Radiation Oncology | Admitting: Radiation Oncology

## 2023-01-24 DIAGNOSIS — D0512 Intraductal carcinoma in situ of left breast: Secondary | ICD-10-CM | POA: Diagnosis not present

## 2023-01-24 LAB — RAD ONC ARIA SESSION SUMMARY
Course Elapsed Days: 15
Plan Fractions Treated to Date: 12
Plan Prescribed Dose Per Fraction: 2.66 Gy
Plan Total Fractions Prescribed: 16
Plan Total Prescribed Dose: 42.56 Gy
Reference Point Dosage Given to Date: 31.92 Gy
Reference Point Session Dosage Given: 2.66 Gy
Session Number: 12

## 2023-01-25 ENCOUNTER — Ambulatory Visit
Admission: RE | Admit: 2023-01-25 | Discharge: 2023-01-25 | Disposition: A | Discharge status: Home / Self Care | Payer: Medicare Other | Source: Ambulatory Visit | Attending: Radiation Oncology | Admitting: Radiation Oncology

## 2023-01-25 ENCOUNTER — Inpatient Hospital Stay: Payer: Medicare Other

## 2023-01-25 ENCOUNTER — Other Ambulatory Visit: Payer: Self-pay

## 2023-01-25 ENCOUNTER — Telehealth: Payer: Self-pay

## 2023-01-25 DIAGNOSIS — D0512 Intraductal carcinoma in situ of left breast: Secondary | ICD-10-CM | POA: Diagnosis not present

## 2023-01-25 LAB — RAD ONC ARIA SESSION SUMMARY
Course Elapsed Days: 16
Plan Fractions Treated to Date: 13
Plan Prescribed Dose Per Fraction: 2.66 Gy
Plan Total Fractions Prescribed: 16
Plan Total Prescribed Dose: 42.56 Gy
Reference Point Dosage Given to Date: 34.58 Gy
Reference Point Session Dosage Given: 2.66 Gy
Session Number: 13

## 2023-01-25 NOTE — Telephone Encounter (Signed)
Clinical Social Work returned patient's call.   Left voicemail with contact information and request for return call.

## 2023-01-25 NOTE — Progress Notes (Signed)
[facility] CSW Progress Note  Clinical Child psychotherapist contacted patient by phone to address needs.  She inquired about financial assistance.  She currently does not qualify for the [facility], but will be applying for food stamps.  She is interested in receiving a [employer] and will be meeting with Dallie Piles on Monday.  Also provided her with the Financial Navigator contact information and Patient Accounting.  Patient expressed no other needs.    Dorothey Baseman, LCSW Clinical Social Worker [facility]

## 2023-01-28 ENCOUNTER — Inpatient Hospital Stay: Payer: Medicare Other

## 2023-01-28 ENCOUNTER — Ambulatory Visit
Admission: RE | Admit: 2023-01-28 | Discharge: 2023-01-28 | Disposition: A | Discharge status: Home / Self Care | Payer: Medicare Other | Source: Ambulatory Visit | Attending: Radiation Oncology | Admitting: Radiation Oncology

## 2023-01-28 ENCOUNTER — Other Ambulatory Visit: Payer: Self-pay

## 2023-01-28 DIAGNOSIS — D0512 Intraductal carcinoma in situ of left breast: Secondary | ICD-10-CM | POA: Diagnosis not present

## 2023-01-28 DIAGNOSIS — C50912 Malignant neoplasm of unspecified site of left female breast: Secondary | ICD-10-CM

## 2023-01-28 LAB — RAD ONC ARIA SESSION SUMMARY
Course Elapsed Days: 19
Plan Fractions Treated to Date: 14
Plan Prescribed Dose Per Fraction: 2.66 Gy
Plan Total Fractions Prescribed: 16
Plan Total Prescribed Dose: 42.56 Gy
Reference Point Dosage Given to Date: 37.24 Gy
Reference Point Session Dosage Given: 2.66 Gy
Session Number: 14

## 2023-01-28 LAB — CBC (CANCER CENTER ONLY)
HCT: 45.6 % (ref 36.0–46.0)
Hemoglobin: 14.8 g/dL (ref 12.0–15.0)
MCH: 30.8 pg (ref 26.0–34.0)
MCHC: 32.5 g/dL (ref 30.0–36.0)
MCV: 94.8 fL (ref 80.0–100.0)
Platelet Count: 364 10*3/uL (ref 150–400)
RBC: 4.81 MIL/uL (ref 3.87–5.11)
RDW: 13.2 % (ref 11.5–15.5)
WBC Count: 7 10*3/uL (ref 4.0–10.5)
nRBC: 0 % (ref 0.0–0.2)

## 2023-01-29 ENCOUNTER — Ambulatory Visit: Payer: Medicare Other

## 2023-01-29 ENCOUNTER — Other Ambulatory Visit: Payer: Self-pay

## 2023-01-29 ENCOUNTER — Ambulatory Visit
Admission: RE | Admit: 2023-01-29 | Discharge: 2023-01-29 | Disposition: A | Discharge status: Home / Self Care | Payer: Medicare Other | Source: Ambulatory Visit | Attending: Radiation Oncology | Admitting: Radiation Oncology

## 2023-01-29 DIAGNOSIS — D0512 Intraductal carcinoma in situ of left breast: Secondary | ICD-10-CM | POA: Diagnosis not present

## 2023-01-29 LAB — RAD ONC ARIA SESSION SUMMARY
Course Elapsed Days: 20
Plan Fractions Treated to Date: 15
Plan Prescribed Dose Per Fraction: 2.66 Gy
Plan Total Fractions Prescribed: 16
Plan Total Prescribed Dose: 42.56 Gy
Reference Point Dosage Given to Date: 39.9 Gy
Reference Point Session Dosage Given: 2.66 Gy
Session Number: 15

## 2023-01-30 ENCOUNTER — Other Ambulatory Visit: Payer: Self-pay

## 2023-01-30 ENCOUNTER — Ambulatory Visit
Admission: RE | Admit: 2023-01-30 | Discharge: 2023-01-30 | Disposition: A | Discharge status: Home / Self Care | Payer: Medicare Other | Source: Ambulatory Visit | Attending: Radiation Oncology | Admitting: Radiation Oncology

## 2023-01-30 ENCOUNTER — Ambulatory Visit: Payer: Medicare Other

## 2023-01-30 DIAGNOSIS — D0512 Intraductal carcinoma in situ of left breast: Secondary | ICD-10-CM | POA: Diagnosis not present

## 2023-01-30 LAB — RAD ONC ARIA SESSION SUMMARY
Course Elapsed Days: 21
Plan Fractions Treated to Date: 16
Plan Prescribed Dose Per Fraction: 2.66 Gy
Plan Total Fractions Prescribed: 16
Plan Total Prescribed Dose: 42.56 Gy
Reference Point Dosage Given to Date: 42.56 Gy
Reference Point Session Dosage Given: 2.66 Gy
Session Number: 16

## 2023-01-31 ENCOUNTER — Ambulatory Visit
Admission: RE | Admit: 2023-01-31 | Discharge: 2023-01-31 | Disposition: A | Discharge status: Home / Self Care | Payer: Medicare Other | Source: Ambulatory Visit | Attending: Radiation Oncology | Admitting: Radiation Oncology

## 2023-01-31 ENCOUNTER — Ambulatory Visit: Payer: Medicare Other

## 2023-01-31 ENCOUNTER — Other Ambulatory Visit: Payer: Self-pay

## 2023-01-31 DIAGNOSIS — D0512 Intraductal carcinoma in situ of left breast: Secondary | ICD-10-CM | POA: Diagnosis not present

## 2023-01-31 LAB — RAD ONC ARIA SESSION SUMMARY
Course Elapsed Days: 22
Plan Fractions Treated to Date: 1
Plan Prescribed Dose Per Fraction: 2 Gy
Plan Total Fractions Prescribed: 8
Plan Total Prescribed Dose: 16 Gy
Reference Point Dosage Given to Date: 2 Gy
Reference Point Session Dosage Given: 2 Gy
Session Number: 17

## 2023-02-01 ENCOUNTER — Ambulatory Visit
Admission: RE | Admit: 2023-02-01 | Discharge: 2023-02-01 | Disposition: A | Discharge status: Home / Self Care | Payer: Medicare Other | Source: Ambulatory Visit | Attending: Radiation Oncology | Admitting: Radiation Oncology

## 2023-02-01 ENCOUNTER — Other Ambulatory Visit: Payer: Self-pay

## 2023-02-01 DIAGNOSIS — D0512 Intraductal carcinoma in situ of left breast: Secondary | ICD-10-CM | POA: Diagnosis not present

## 2023-02-01 LAB — RAD ONC ARIA SESSION SUMMARY
Course Elapsed Days: 23
Plan Fractions Treated to Date: 2
Plan Prescribed Dose Per Fraction: 2 Gy
Plan Total Fractions Prescribed: 8
Plan Total Prescribed Dose: 16 Gy
Reference Point Dosage Given to Date: 4 Gy
Reference Point Session Dosage Given: 2 Gy
Session Number: 18

## 2023-02-04 ENCOUNTER — Ambulatory Visit
Admission: RE | Admit: 2023-02-04 | Discharge: 2023-02-04 | Disposition: A | Discharge status: Home / Self Care | Payer: Medicare Other | Source: Ambulatory Visit | Attending: Radiation Oncology | Admitting: Radiation Oncology

## 2023-02-04 ENCOUNTER — Other Ambulatory Visit: Payer: Self-pay

## 2023-02-04 DIAGNOSIS — D0512 Intraductal carcinoma in situ of left breast: Secondary | ICD-10-CM | POA: Insufficient documentation

## 2023-02-04 LAB — RAD ONC ARIA SESSION SUMMARY
Course Elapsed Days: 26
Plan Fractions Treated to Date: 3
Plan Prescribed Dose Per Fraction: 2 Gy
Plan Total Fractions Prescribed: 8
Plan Total Prescribed Dose: 16 Gy
Reference Point Dosage Given to Date: 6 Gy
Reference Point Session Dosage Given: 2 Gy
Session Number: 19

## 2023-02-05 ENCOUNTER — Other Ambulatory Visit: Payer: Self-pay

## 2023-02-05 ENCOUNTER — Ambulatory Visit
Admission: RE | Admit: 2023-02-05 | Discharge: 2023-02-05 | Disposition: A | Discharge status: Home / Self Care | Payer: Medicare Other | Source: Ambulatory Visit | Attending: Radiation Oncology | Admitting: Radiation Oncology

## 2023-02-05 DIAGNOSIS — D0512 Intraductal carcinoma in situ of left breast: Secondary | ICD-10-CM | POA: Diagnosis not present

## 2023-02-05 LAB — RAD ONC ARIA SESSION SUMMARY
Course Elapsed Days: 27
Plan Fractions Treated to Date: 4
Plan Prescribed Dose Per Fraction: 2 Gy
Plan Total Fractions Prescribed: 8
Plan Total Prescribed Dose: 16 Gy
Reference Point Dosage Given to Date: 8 Gy
Reference Point Session Dosage Given: 2 Gy
Session Number: 20

## 2023-02-06 ENCOUNTER — Other Ambulatory Visit: Payer: Self-pay

## 2023-02-06 ENCOUNTER — Ambulatory Visit
Admission: RE | Admit: 2023-02-06 | Discharge: 2023-02-06 | Disposition: A | Discharge status: Home / Self Care | Payer: Medicare Other | Source: Ambulatory Visit | Attending: Radiation Oncology | Admitting: Radiation Oncology

## 2023-02-06 DIAGNOSIS — D0512 Intraductal carcinoma in situ of left breast: Secondary | ICD-10-CM | POA: Diagnosis not present

## 2023-02-06 LAB — RAD ONC ARIA SESSION SUMMARY
Course Elapsed Days: 28
Plan Fractions Treated to Date: 5
Plan Prescribed Dose Per Fraction: 2 Gy
Plan Total Fractions Prescribed: 8
Plan Total Prescribed Dose: 16 Gy
Reference Point Dosage Given to Date: 10 Gy
Reference Point Session Dosage Given: 2 Gy
Session Number: 21

## 2023-02-07 ENCOUNTER — Ambulatory Visit
Admission: RE | Admit: 2023-02-07 | Discharge: 2023-02-07 | Disposition: A | Discharge status: Home / Self Care | Payer: Medicare Other | Source: Ambulatory Visit | Attending: Radiation Oncology | Admitting: Radiation Oncology

## 2023-02-07 ENCOUNTER — Other Ambulatory Visit: Payer: Self-pay

## 2023-02-07 DIAGNOSIS — D0512 Intraductal carcinoma in situ of left breast: Secondary | ICD-10-CM | POA: Diagnosis not present

## 2023-02-07 LAB — RAD ONC ARIA SESSION SUMMARY
Course Elapsed Days: 29
Plan Fractions Treated to Date: 6
Plan Prescribed Dose Per Fraction: 2 Gy
Plan Total Fractions Prescribed: 8
Plan Total Prescribed Dose: 16 Gy
Reference Point Dosage Given to Date: 12 Gy
Reference Point Session Dosage Given: 2 Gy
Session Number: 22

## 2023-02-08 ENCOUNTER — Ambulatory Visit
Admission: RE | Admit: 2023-02-08 | Discharge: 2023-02-08 | Disposition: A | Discharge status: Home / Self Care | Payer: Medicare Other | Source: Ambulatory Visit | Attending: Radiation Oncology | Admitting: Radiation Oncology

## 2023-02-08 ENCOUNTER — Ambulatory Visit: Payer: Medicare Other

## 2023-02-08 ENCOUNTER — Other Ambulatory Visit: Payer: Self-pay

## 2023-02-08 DIAGNOSIS — D0512 Intraductal carcinoma in situ of left breast: Secondary | ICD-10-CM | POA: Diagnosis not present

## 2023-02-08 LAB — RAD ONC ARIA SESSION SUMMARY
Course Elapsed Days: 30
Plan Fractions Treated to Date: 7
Plan Prescribed Dose Per Fraction: 2 Gy
Plan Total Fractions Prescribed: 8
Plan Total Prescribed Dose: 16 Gy
Reference Point Dosage Given to Date: 14 Gy
Reference Point Session Dosage Given: 2 Gy
Session Number: 23

## 2023-02-11 ENCOUNTER — Ambulatory Visit
Admission: RE | Admit: 2023-02-11 | Discharge: 2023-02-11 | Disposition: A | Discharge status: Home / Self Care | Payer: Medicare Other | Source: Ambulatory Visit | Attending: Radiation Oncology | Admitting: Radiation Oncology

## 2023-02-11 ENCOUNTER — Encounter (HOSPITAL_COMMUNITY): Payer: Self-pay

## 2023-02-11 ENCOUNTER — Other Ambulatory Visit: Payer: Self-pay

## 2023-02-11 DIAGNOSIS — D0512 Intraductal carcinoma in situ of left breast: Secondary | ICD-10-CM | POA: Diagnosis not present

## 2023-02-11 LAB — RAD ONC ARIA SESSION SUMMARY
Course Elapsed Days: 33
Plan Fractions Treated to Date: 8
Plan Prescribed Dose Per Fraction: 2 Gy
Plan Total Fractions Prescribed: 8
Plan Total Prescribed Dose: 16 Gy
Reference Point Dosage Given to Date: 16 Gy
Reference Point Session Dosage Given: 2 Gy
Session Number: 24

## 2023-02-12 ENCOUNTER — Encounter: Payer: Self-pay | Admitting: *Deleted

## 2023-02-12 NOTE — Radiation Completion Notes (Signed)
Patient Name: Ruth Morgan, Ruth Morgan MRN: [id number] Date of Birth: 10-Dec-1955 Referring Physician: Antoine Primas, M.D. Date of Service: 2023-02-12 Radiation Oncologist: Carmina Miller, M.D. [facility]                             RADIATION ONCOLOGY END OF TREATMENT NOTE     Diagnosis: D05.12 Intraductal carcinoma in situ of left breast Staging on 2022-10-10: DCIS (ductal carcinoma in situ) T=cTis (DCIS), N=cN0, M=cM0 Intent: Curative     HPI: Patient is a 68 year old female who presented with an abnormal mammogram of her left breast.  Ultrasound confirmed the mass although no specific size was given.  She underwent a targeted biopsy which was positive for high grade 3 ductal carcinoma in situ with apocrine and secretory features.  She went to have a wide local excision for a 5 cm area of ductal carcinoma in situ grade 3 with expansive comedonecrosis.  DCIS was present at lateral margin unifocal.  There is also within 1 mm of the anterior margin and 1 mm from the medial and inferior margins.  1 regional sentinel lymph node was submitted and was negative for metastatic disease.  Tumor was ER negative.  She went on to have a reexcision with again residual ductal carcinoma in situ present.  There was some controversy about the margin and based on Dr. Geoffery Lyons intraoperative impression he believe the posterior positive margin in the reexcision corresponds to an old anterior lumpectomy margin and was not the true margin there was also DCIS within 1 mm of the anterior and inferior margin.  Patient has done well postoperatively.  She specifically denies breast tenderness cough or bone pain.      ==========DELIVERED PLANS==========  First Treatment Date: 2023-01-09 Last Treatment Date: 2023-02-11   Plan Name: Breast_L Site: Breast, Left Technique: 3D Mode: Photon Dose Per Fraction: 2.66 Gy Prescribed Dose (Delivered / Prescribed): 42.56 Gy / 42.56 Gy Prescribed Fxs (Delivered  / Prescribed): 16 / 16   Plan Name: Breast_L_Bst Site: Breast, Left Technique: 3D Mode: Photon Dose Per Fraction: 2 Gy Prescribed Dose (Delivered / Prescribed): 16 Gy / 16 Gy Prescribed Fxs (Delivered / Prescribed): 8 / 8     ==========ON TREATMENT VISIT DATES========== 2023-01-09, 2023-01-15, 2023-01-22, 2023-01-29, 2023-02-05, 2023-02-11     ==========UPCOMING VISITS==========       ==========APPENDIX - ON TREATMENT VISIT NOTES==========   See weekly On Treatment Notes in Epic for details in the Media tab (listed as Progress notes on the On Treatment Visit Dates listed above).

## 2023-02-13 ENCOUNTER — Other Ambulatory Visit: Payer: Self-pay | Admitting: Internal Medicine

## 2023-02-13 ENCOUNTER — Ambulatory Visit
Admission: RE | Admit: 2023-02-13 | Discharge: 2023-02-13 | Disposition: A | Discharge status: Home / Self Care | Payer: Medicare Other | Source: Ambulatory Visit | Attending: Internal Medicine | Admitting: Internal Medicine

## 2023-02-13 DIAGNOSIS — I351 Nonrheumatic aortic (valve) insufficiency: Secondary | ICD-10-CM | POA: Diagnosis not present

## 2023-02-13 DIAGNOSIS — R0602 Shortness of breath: Secondary | ICD-10-CM

## 2023-02-13 DIAGNOSIS — R000 Tachycardia, unspecified: Secondary | ICD-10-CM

## 2023-02-13 MED ORDER — GADOBUTROL 1 MMOL/ML IV SOLN
8.0000 mL | Freq: Once | INTRAVENOUS | Status: AC | PRN
  Administered 2023-02-13: 8 mL via INTRAVENOUS

## 2023-02-22 ENCOUNTER — Other Ambulatory Visit: Payer: Self-pay | Admitting: Internal Medicine

## 2023-02-22 DIAGNOSIS — I10 Essential (primary) hypertension: Secondary | ICD-10-CM

## 2023-02-25 ENCOUNTER — Inpatient Hospital Stay: Payer: Medicare Other | Attending: Oncology | Admitting: Oncology

## 2023-02-25 ENCOUNTER — Encounter: Payer: Self-pay | Admitting: Oncology

## 2023-02-25 VITALS — BP 135/97 | HR 88 | Temp 98.0°F | Resp 18 | Wt 122.5 lb

## 2023-02-25 DIAGNOSIS — Z8041 Family history of malignant neoplasm of ovary: Secondary | ICD-10-CM | POA: Diagnosis not present

## 2023-02-25 DIAGNOSIS — Z801 Family history of malignant neoplasm of trachea, bronchus and lung: Secondary | ICD-10-CM | POA: Diagnosis not present

## 2023-02-25 DIAGNOSIS — D0512 Intraductal carcinoma in situ of left breast: Secondary | ICD-10-CM | POA: Insufficient documentation

## 2023-02-25 DIAGNOSIS — Z171 Estrogen receptor negative status [ER-]: Secondary | ICD-10-CM | POA: Insufficient documentation

## 2023-02-25 DIAGNOSIS — Z803 Family history of malignant neoplasm of breast: Secondary | ICD-10-CM | POA: Insufficient documentation

## 2023-02-25 DIAGNOSIS — Z87891 Personal history of nicotine dependence: Secondary | ICD-10-CM | POA: Diagnosis not present

## 2023-02-25 DIAGNOSIS — Z8616 Personal history of COVID-19: Secondary | ICD-10-CM | POA: Diagnosis not present

## 2023-02-25 NOTE — Assessment & Plan Note (Addendum)
 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  Per Dr. Tonna Boehringer, based on his intraoperative impression, the positive posterior margin noted on lastest report likely correlates the to anterior margin of original lumpectomy specimen,therefore this is not a true margin. .  close margin <48mm. Option of re-excision was discussed . Patient followed up with Dr. Tonna Boehringer on 12/18 and opted to omit additional re-excision.   S/p adjuvant radiation.   ER is negative, so no endocrinotherapy needed.  Recommend annual mammogram- August 2025

## 2023-02-25 NOTE — Progress Notes (Signed)
 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  Per Dr. Tonna Boehringer, based on his intraoperative impression, the positive posterior margin noted on lastest report likely correlates the to anterior margin of original lumpectomy specimen,therefore this is not a true margin. .  close margin <23mm. Option of re-excision was discussed . Patient followed up with Dr. Tonna Boehringer on 12/18 and opted to omit additional re-excision.   S/p adjuvant radiation.   ER is negative, so no endocrinotherapy needed.  Recommend annual mammogram- August 2025     Orders Placed This Encounter  Procedures   MM 3D DIAGNOSTIC MAMMOGRAM BILATERAL BREAST    Standing Status:   Future    Expected Date:   08/25/2023    Expiration Date:   02/25/2024    Reason for Exam (SYMPTOM  OR DIAGNOSIS REQUIRED):   DCIS left breast    Preferred imaging location?:   [facility]   [place] LIMITED ULTRASOUND INCLUDING AXILLA LEFT BREAST     Standing Status:   Future    Expected Date:   08/25/2023    Expiration Date:   02/25/2024    Reason for Exam (SYMPTOM  OR DIAGNOSIS REQUIRED):   DCIS left breast    Preferred imaging location?:   [facility]   [place] LIMITED ULTRASOUND INCLUDING AXILLA RIGHT BREAST    Standing Status:   Future    Expected Date:   09/04/2023    Expiration Date:   02/25/2024    Reason for Exam (SYMPTOM  OR DIAGNOSIS REQUIRED):   DCIS Left breast    Preferred imaging location?:   [facility]   Follow up 1 year All questions were answered. The patient knows to call the clinic with any problems,  questions or concerns.  Rickard Patience, MD, PhD [facility] Hematology Oncology 02/25/2023    HISTORY OF PRESENTING ILLNESS:  Ruth Morgan 68 y.o. female presents to establish care for left breast DCIS I have reviewed her chart and materials related to her cancer extensively and collaborated history with the patient. Summary of oncologic history is as follows: Oncology History  DCIS (ductal carcinoma in situ)  09/12/2022 Imaging   Bilateral screening mammogram  In the left breast, a possible mass with architectural distortion warrants further evaluation. In the right breast, no findings suspicious for malignancy.    09/21/2022 Mammogram   Unilateral left diagnostic mammogram and [place]  1. Developing asymmetry central, slightly medial aspect of the left breast, without associated mass or convincing distortion. Tissue sampling is recommended.   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 Breast, left, needle core biopsy, UIQ, ribbon clip :  - DUCTAL CARCINOMA IN SITU,  APOCRINE AND SECRETORY FEATURES, HIGH GRADE (3)  - NECROSIS: NOT IDENTIFIED  - CALCIFICATIONS: NOT IDENTIFIED  - DCIS LENGTH: 0.3 CM   Patient reports family history cancer.   Menarche at age of 40 or 68 yo First live birth at age of 34 OCP use: no  History of hysterectomy: yes, ovaries removed. Menopausal status: postmenopausal History of HRT use for 5 years History of chest radiation: no  Number  of previous breast biopsies: no    10/10/2022 Cancer Staging   Staging form: Breast, AJCC 8th Edition - Clinical stage from 10/10/2022: Stage 0 (cTis (DCIS), cN0, cM0) - Signed by Rickard Patience, MD on 10/10/2022 Stage prefix: Initial diagnosis    Genetic Testing   No pathogenic variants identified on the [facility] Multi-Cancer+RNA panel. VUS in MSH6 called c.[id number] (p.Lys374del)  identified. The report date is 10/20/2022.  The Multi-Cancer + RNA Panel offered by [facility] includes  sequencing and/or deletion/duplication analysis of the following 70 genes:  AIP*, ALK, APC*, ATM*, AXIN2*, BAP1*, BARD1*, BLM*, BMPR1A*, BRCA1*, BRCA2*, BRIP1*, CDC73*, CDH1*, CDK4, CDKN1B*, CDKN2A, CHEK2*, CTNNA1*, DICER1*, EPCAM, EGFR, FH*, FLCN*, GREM1, HOXB13, KIT, LZTR1, MAX*, MBD4, MEN1*, MET, MITF, MLH1*, MSH2*, MSH3*, MSH6*, MUTYH*, NF1*, NF2*, NTHL1*, PALB2*, PDGFRA, PMS2*, POLD1*, POLE*, POT1*, PRKAR1A*, PTCH1*, PTEN*, RAD51C*, RAD51D*, RB1*, RET, SDHA*, SDHAF2*, SDHB*, SDHC*, SDHD*, SMAD4*, SMARCA4*, SMARCB1*, SMARCE1*, STK11*, SUFU*, TMEM127*, TP53*, TSC1*, TSC2*, VHL*. RNA analysis is performed for * genes.   11/01/2022 Surgery   S/p left breast lumpectomy with SLNB  1. Breast, lumpectomy, left mass :      - DUCTAL CARCINOMA IN SITU (DCIS).      - SEE CANCER SUMMARY AND NOTE BELOW.      - PRIOR BIOPSY SITE CHANGE WITH ASSOCIATED CLIP.      - SCOUT TAG PRESENT.  2. Lymph node, sentinel, biopsy, Left axillary #1 :      - ONE LYMPH NODE NEGATIVE FOR MALIGNANCY (0/1).  3. Breast, lumpectomy, left superior margin :      - RESIDUAL DUCTAL CARCINOMA IN SITU (DCIS); NEW SUPERIOR MARGIN IS NEGATIVE.  Diagnosis Note : In the main lumpectomy specimen (part 1) cauterized DCIS  extends to the superior and lateral margins.The size of DCIS is estimated to  measure at least 49 mm since the specimen measured 50 mm medial to lateral, the lateral margin is positive, and DCIS is 1 mm from the medial margin. In the re-excised superior margin specimen (part 3) residual DCIS, measuring atleast 5 mm, is present along the inferior aspect of the specimen corresponding to the superior margin of the main lumpectomy specimen.  The staging summary reflects the final margin status and incorporates the findings from the prior biopsy as well as the current three specimens.  TUMOR Histologic Type: Ductal carcinoma in situ Size (Extent) of DCIS:  Estimated size (extent) of DCIS is at least 49 mm Nuclear Grade: Grade  3 (high) Necrosis: Present, central (expansive "comedo" necrosis)  MARGINS Margin Status: DCIS is present at margin: Lateral, unifocal DCIS is within 1 mm to anterior margin DCIS is 1 mm from medial and inferior margins  REGIONAL LYMPH NODES Regional Lymph node Status: All regional lymph nodes negative for tumor Total Number of Lymph Nodes Examined (sentinel and non-sentinel): 1 Number of Sentinel Nodes Examined: 1   ER 0%   12/06/2022 Surgery   S/p re-excision.   1. Breast, lumpectomy, left, anterior lateral margin :      - RESIDUAL DUCTAL CARCINOMA IN SITU (DCIS).      - CHANGES CONSISTENT WITH PRIOR SURGICAL PROCEDURE.      - SEE NOTE FOR FINAL MARGIN STATUS.  2. Breast, lumpectomy, left, inferior margin :      - RESIDUAL DUCTAL CARCINOMA IN SITU (DCIS).      - CHANGES CONSISTENT WITH PRIOR SURGICAL PROCEDURE.      - SEE NOTE FOR FINAL MARGIN STATUS.  Diagnosis Note : Multifocal residual high-grade ductal carcinoma in  situ (DCIS)  is present in both of the above specimens.Based on the provided orientation of the current specimens and the prior lumpectomy specimen the final margins status is:    DCIS is at the posterior margin (minimal extent)    DCIS is within 1 mm of the anterior margin (extensive extent)    DCIS is within 1 mm of the inferior margin (unifocal)     CORRECTED [id number]: ADDENDUM: After the sign-out of this case, the margin status was discussed with Dr. Tonna Boehringer on 12/11/2022. Based on his intraoperative impression of the orientation of the re-excised "left anterior lateral margin" (specimen 1) in relation to the prior lumpectomy site, he believes the positive posterior margin in the re-excision corresponds to the "old' anterior lumpectomy margin and is thus not a "true" margin. The interpretation of the remaining margins is unchanged (DCIS is within 1 mm of the anterior and inferior margins).     01/09/2023 - 02/11/2023 Radiation Therapy   Adjutant  left breast radiation.     Patient reports feeling well. No new complaints.  She has finished adjuvant radiation and tolerated well with mild skin radiation dermatitis.    MEDICAL HISTORY:  Past Medical History:  Diagnosis Date   ADHD (attention deficit hyperactivity disorder)    a.) on amphetamine-dextroamphetamine   Angina at rest [facility])    Anxiety state, unspecified 12/05/2012   Asthma    Bilateral inguinal hernia 04/2022   CAD (coronary artery disease) 03/23/2021   a.) MPI 03/01/2021: no ischemia; b.) cCTA 03/23/2021: Ca2+ 1.22 (48th %ile) in RCA; c.) cCTA 07/26/2022: Ca2+ 25.5 (58th %ile) in RCA distribution   Carpal tunnel syndrome    a.) s/p release on the RIGHT   COPD (chronic obstructive pulmonary disease) (HCC)    DDD (degenerative disc disease), lumbar    Diastolic dysfunction 03/01/2021   a.) TTE 03/01/2021: EF 50%, triv MR/PR, mild TR, mod AR, G1DD   Diverticulosis of colon    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   GERD (gastroesophageal reflux disease)    Headache    History of 2019 novel coronavirus disease (COVID-19) 2021   History of bilateral cataract extraction 2016   History of shingles 2013   Hyperlipidemia    Hypertension    Orthostatic hypotension    Prediabetes 12/05/2012   Shingles 06/25/2017   Shortness of breath on exertion    Tubular adenoma of colon 2010    SURGICAL HISTORY: Past Surgical History:  Procedure Laterality Date   ABDOMINAL HYSTERECTOMY     APPENDECTOMY  1972   BREAST BIOPSY Left 2016   needle bx /clip neg   BREAST BIOPSY Left 10/05/2022   stereo bx, asymmetry, X clip - pathology (+) for DCIS   BREAST BIOPSY Left 10/05/2022   MM LT BREAST BX W LOC DEV 1ST LESION IMAGE BX SPEC STEREO GUIDE 10/05/2022 [facility]   BREAST LUMPECTOMY Left 12/06/2022   Procedure: BREAST LUMPECTOMY;  Surgeon: Sung Amabile, DO;  Location: [facility];  Service: General;  Laterality: Left;  re-excision of margins   BROW LIFT  Bilateral 04/16/2017   Procedure: BLEPHAROPLASTY UPPER EYELID: W/EXCESS SKIN;  Surgeon: Imagene Riches, MD;  Location: [facility];  Service: Ophthalmology;  Laterality: Bilateral;   CARPAL TUNNEL RELEASE Right 2014   CATARACT EXTRACTION W/ INTRAOCULAR LENS IMPLANT Right 05/04/2014   CATARACT EXTRACTION W/ INTRAOCULAR LENS IMPLANT Left 06/09/2014   COLONOSCOPY N/A 06/02/2021   Procedure: COLONOSCOPY;  Surgeon: Jaynie Collins, DO;  Location: [facility]  ENDOSCOPY;  Service: Gastroenterology;  Laterality: N/A;   COLONOSCOPY     2016, 2022   FINGER AMPUTATION Left    ring finger   INSERTION OF MESH Bilateral 04/27/2022   Procedure: INSERTION OF MESH;  Surgeon: Sung Amabile, DO;  Location: [facility];  Service: General;  Laterality: Bilateral;   LASIK  2006   NASAL SINUS SURGERY  2004   PART MASTECTOMY,RADIO FREQUENCY LOCALIZER,AXILLARY SENTINEL NODE BIOPSY Left 11/01/2022   Procedure: PART MASTECTOMY,RADIO FREQUENCY LOCALIZER,AXILLARY SENTINEL NODE BIOPSY;  Surgeon: Sung Amabile, DO;  Location: [facility];  Service: General;  Laterality: Left;   TOTAL ABDOMINAL HYSTERECTOMY W/ BILATERAL SALPINGOOPHORECTOMY  1985   benign ovarian tumors    SOCIAL HISTORY: Social History   Socioeconomic History   Marital status: Divorced    Spouse name: Not on file   Number of children: 1   Years of education: Not on file   Highest education level: Not on file  Occupational History   Not on file  Tobacco Use   Smoking status: Former    Current packs/day: 0.00    Average packs/day: 0.8 packs/day for 46.0 years (34.5 ttl pk-yrs)    Types: Cigarettes    Start date: 12/1975    Quit date: 12/2021    Years since quitting: 1.2   Smokeless tobacco: Never   Tobacco comments:    .5 ppd currently  Vaping Use   Vaping status: Never Used  Substance and Sexual Activity   Alcohol use: Yes    Comment: rare   Drug use: No   Sexual activity: Yes  Other Topics Concern   Not on file  Social History  Narrative   Lives with boyfriend   Social Drivers of Health   Financial Resource Strain: Low Risk  (10/10/2022)   Overall Financial Resource Strain (CARDIA)    Difficulty of Paying Living Expenses: Not very hard  Food Insecurity: No Food Insecurity (10/10/2022)   Hunger Vital Sign    Worried About Running Out of Food in the Last Year: Never true    Ran Out of Food in the Last Year: Never true  Transportation Needs: No Transportation Needs (10/10/2022)   PRAPARE - Administrator, Civil Service (Medical): No    Lack of Transportation (Non-Medical): No  Physical Activity: Not on file  Stress: Not on file  Social Connections: Not on file  Intimate Partner Violence: Not At Risk (10/10/2022)   Humiliation, Afraid, Rape, and Kick questionnaire    Fear of Current or Ex-Partner: No    Emotionally Abused: No    Physically Abused: No    Sexually Abused: No    FAMILY HISTORY: Family History  Problem Relation Age of Onset   Hypertension Mother    Stomach cancer Father        d. early 21s   CAD Father 53       MI   Kidney failure Father        ESRD on HD   Hyperlipidemia Father    Diabetes Sister    CAD Sister        MI   Diabetes Brother    CAD Brother        MI   Lung cancer Paternal Uncle    Lung cancer Paternal Uncle    Ovarian cancer Paternal Grandmother        d. 40s   Lung cancer Paternal Grandfather        d.>50   Breast cancer Niece 11  ALLERGIES:  is allergic to lisinopril.  MEDICATIONS:  Current Outpatient Medications  Medication Sig Dispense Refill   acetaminophen (TYLENOL) 325 MG tablet Take 650 mg by mouth every 6 (six) hours as needed for mild pain (pain score 1-3).     albuterol (VENTOLIN HFA) 108 (90 Base) MCG/ACT inhaler Inhale 2 puffs into the lungs every 6 (six) hours as needed for wheezing or shortness of breath. 18 g 0   amphetamine-dextroamphetamine (ADDERALL) 20 MG tablet Take 30 mg by mouth 2 (two) times daily.      Budeson-Glycopyrrol-Formoterol (BREZTRI AEROSPHERE) 160-9-4.8 MCG/ACT AERO Inhale 2 puffs into the lungs in the morning and at bedtime.     Cholecalciferol (VITAMIN D-3) 25 MCG (1000 UT) CAPS Take 100 Units by mouth daily.     diltiazem (CARDIZEM CD) 120 MG 24 hr capsule Take 120 mg by mouth at bedtime.     irbesartan (AVAPRO) 75 MG tablet Take by mouth.     MULTIPLE VITAMIN PO Take 1 tablet by mouth daily.     rosuvastatin (CRESTOR) 20 MG tablet Take 20 mg by mouth at bedtime.     No current facility-administered medications for this visit.    Review of Systems  Constitutional:  Negative for appetite change, chills, fatigue and fever.  HENT:   Negative for hearing loss and voice change.   Eyes:  Negative for eye problems.  Respiratory:  Negative for chest tightness and cough.   Cardiovascular:  Negative for chest pain.  Gastrointestinal:  Negative for abdominal distention, abdominal pain and blood in stool.  Endocrine: Negative for hot flashes.  Genitourinary:  Negative for difficulty urinating and frequency.   Musculoskeletal:  Negative for arthralgias.  Skin:  Negative for itching and rash.  Neurological:  Negative for extremity weakness.  Hematological:  Negative for adenopathy.  Psychiatric/Behavioral:  Negative for confusion.      PHYSICAL EXAMINATION: ECOG PERFORMANCE STATUS: 0 - Asymptomatic  Vitals:   02/25/23 1313  BP: (!) 135/97  Pulse: 88  Resp: 18  Temp: 98 F (36.7 C)  SpO2: 98%   Filed Weights   02/25/23 1313  Weight: 122 lb 8 oz (55.6 kg)    Physical Exam Constitutional:      General: She is not in acute distress.    Appearance: She is not diaphoretic.  HENT:     Head: Normocephalic and atraumatic.  Eyes:     General: No scleral icterus. Cardiovascular:     Rate and Rhythm: Normal rate and regular rhythm.     Heart sounds: No murmur heard. Pulmonary:     Effort: Pulmonary effort is normal. No respiratory distress.     Breath sounds: No  wheezing.  Abdominal:     General: There is no distension.     Palpations: Abdomen is soft.     Tenderness: There is no abdominal tenderness.  Musculoskeletal:        General: Normal range of motion.     Cervical back: Normal range of motion and neck supple.  Skin:    General: Skin is warm and dry.     Findings: No erythema.  Neurological:     Mental Status: She is alert and oriented to person, place, and time. Mental status is at baseline.     Cranial Nerves: No cranial nerve deficit.     Motor: No abnormal muscle tone.  Psychiatric:        Mood and Affect: Mood and affect normal.     Breast exam was  performed in seated and lying down position. Patient is status post left breast biopsy, with focal skin bruising and tissue swelling.  No palpable breast masses bilaterally.  No palpable axillary adenopathy bilaterally.  LABORATORY DATA:  I have reviewed the data as listed    Latest Ref Rng & Units 01/28/2023    2:53 PM 10/10/2022    3:36 PM 04/24/2022    3:01 PM  CBC  WBC 4.0 - 10.5 K/uL 7.0  8.0  7.1   Hemoglobin 12.0 - 15.0 g/dL 78.2  95.6  21.3   Hematocrit 36.0 - 46.0 % 45.6  45.2  43.2   Platelets 150 - 400 K/uL 364  379  401       Latest Ref Rng & Units 10/10/2022    3:36 PM 04/24/2022    3:01 PM 09/21/2020    3:48 PM  CMP  Glucose 70 - 99 mg/dL 086  578  469   BUN 8 - 23 mg/dL 11  14  14    Creatinine 0.44 - 1.00 mg/dL 6.29  5.28  4.13   Sodium 135 - 145 mmol/L 139  136  138   Potassium 3.5 - 5.1 mmol/L 4.5  4.3  4.9   Chloride 98 - 111 mmol/L 106  102  103   CO2 22 - 32 mmol/L 26  27  26    Calcium 8.9 - 10.3 mg/dL 9.6  9.2  9.6   Total Protein 6.5 - 8.1 g/dL 7.9     Total Bilirubin 0.3 - 1.2 mg/dL 0.7     Alkaline Phos 38 - 126 U/L 75     AST 15 - 41 U/L 20     ALT 0 - 44 U/L 21        RADIOGRAPHIC STUDIES: I have personally reviewed the radiological images as listed and agreed with the findings in the report. MR CARDIAC MORPHOLOGY W WO CONTRAST Result Date:  02/13/2023 CLINICAL DATA:  Shortness of breath, aortic regurgitation EXAM: CARDIAC MRI TECHNIQUE: The patient was scanned on a 1.5 Tesla Siemens magnet. A dedicated cardiac coil was used. Functional imaging was done using Fiesta sequences. 2,3, and 4 chamber views were done to assess for RWMA's. Modified Simpson's rule using a short axis stack was used to calculate an ejection fraction on a dedicated work [employer]. The patient received 8 cc of Gadavist. After 10 minutes inversion recovery sequences were used to assess for infiltration and scar tissue. Velocity flow mapping performed in the ascending aorta and main pulmonary artery. CONTRAST:  8 cc  of Gadavist FINDINGS: 1. Normal left ventricular size, thickness and systolic function (LVEF = 56%). There are no regional wall motion abnormalities. There is no late gadolinium enhancement in the left ventricular myocardium. LVEDV: 98 ml LVESV: 44 ml SV: 55 ml CO: 4.5 L/min Myocardial mass: 71 g 2. Normal right ventricular size, thickness and systolic function (RVEF = 70%). There are no regional wall motion abnormalities. 3.  Normal left and right atrial size. 4. Normal size of the aortic root, ascending aorta and pulmonary artery. 5. Mild aortic regurgitation (regurgitant fraction 16%, regurgitant volume 31 ml), no significant valvular abnormalities. 6.  Normal pericardium.  No pericardial effusion. IMPRESSION: 1.  Normal LV size and systolic function.  LVEF 56%. 2.  No LGE or scar. 3. Mild aortic regurgitation (regurgitant fraction 16%, regurgitant volume 31 ml). 4.  No significant valvular abnormalities. 5.  Normal RV size and function. Electronically Signed   By: Arlys John  Agbor-Etang M.D.   On: 02/13/2023 17:22   MR CARDIAC VELOCITY FLOW MAP Result Date: 02/13/2023 CLINICAL DATA:  Shortness of breath, aortic regurgitation EXAM: CARDIAC MRI TECHNIQUE: The patient was scanned on a 1.5 Tesla Siemens magnet. A dedicated cardiac coil was used.  Functional imaging was done using Fiesta sequences. 2,3, and 4 chamber views were done to assess for RWMA's. Modified Simpson's rule using a short axis stack was used to calculate an ejection fraction on a dedicated work [employer] officer, [employer]. The patient received 8 cc of Gadavist. After 10 minutes inversion recovery sequences were used to assess for infiltration and scar tissue. Velocity flow mapping performed in the ascending aorta and main pulmonary artery. CONTRAST:  8 cc  of Gadavist FINDINGS: 1. Normal left ventricular size, thickness and systolic function (LVEF = 56%). There are no regional wall motion abnormalities. There is no late gadolinium enhancement in the left ventricular myocardium. LVEDV: 98 ml LVESV: 44 ml SV: 55 ml CO: 4.5 L/min Myocardial mass: 71 g 2. Normal right ventricular size, thickness and systolic function (RVEF = 70%). There are no regional wall motion abnormalities. 3.  Normal left and right atrial size. 4. Normal size of the aortic root, ascending aorta and pulmonary artery. 5. Mild aortic regurgitation (regurgitant fraction 16%, regurgitant volume 31 ml), no significant valvular abnormalities. 6.  Normal pericardium.  No pericardial effusion. IMPRESSION: 1.  Normal LV size and systolic function.  LVEF 56%. 2.  No LGE or scar. 3. Mild aortic regurgitation (regurgitant fraction 16%, regurgitant volume 31 ml). 4.  No significant valvular abnormalities. 5.  Normal RV size and function. Electronically Signed   By: Debbe Odea M.D.   On: 02/13/2023 17:22   MR CARDIAC VELOCITY FLOW MAP Result Date: 02/13/2023 CLINICAL DATA:  Shortness of breath, aortic regurgitation EXAM: CARDIAC MRI TECHNIQUE: The patient was scanned on a 1.5 Tesla Siemens magnet. A dedicated cardiac coil was used. Functional imaging was done using Fiesta sequences. 2,3, and 4 chamber views were done to assess for RWMA's. Modified Simpson's rule using a short axis stack was used to calculate an ejection  fraction on a dedicated work [employer] officer, trade [employer]. The patient received 8 cc of Gadavist. After 10 minutes inversion recovery sequences were used to assess for infiltration and scar tissue. Velocity flow mapping performed in the ascending aorta and main pulmonary artery. CONTRAST:  8 cc  of Gadavist FINDINGS: 1. Normal left ventricular size, thickness and systolic function (LVEF = 56%). There are no regional wall motion abnormalities. There is no late gadolinium enhancement in the left ventricular myocardium. LVEDV: 98 ml LVESV: 44 ml SV: 55 ml CO: 4.5 L/min Myocardial mass: 71 g 2. Normal right ventricular size, thickness and systolic function (RVEF = 70%). There are no regional wall motion abnormalities. 3.  Normal left and right atrial size. 4. Normal size of the aortic root, ascending aorta and pulmonary artery. 5. Mild aortic regurgitation (regurgitant fraction 16%, regurgitant volume 31 ml), no significant valvular abnormalities. 6.  Normal pericardium.  No pericardial effusion. IMPRESSION: 1.  Normal LV size and systolic function.  LVEF 56%. 2.  No LGE or scar. 3. Mild aortic regurgitation (regurgitant fraction 16%, regurgitant volume 31 ml). 4.  No significant valvular abnormalities. 5.  Normal RV size and function. Electronically Signed   By: Debbe Odea M.D.   On: 02/13/2023 17:22

## 2023-02-28 ENCOUNTER — Ambulatory Visit
Admission: RE | Admit: 2023-02-28 | Discharge: 2023-02-28 | Disposition: A | Discharge status: Home / Self Care | Payer: Self-pay | Source: Ambulatory Visit | Attending: Internal Medicine | Admitting: Internal Medicine

## 2023-02-28 DIAGNOSIS — I10 Essential (primary) hypertension: Secondary | ICD-10-CM | POA: Insufficient documentation

## 2023-03-14 ENCOUNTER — Ambulatory Visit
Admission: RE | Admit: 2023-03-14 | Discharge: 2023-03-14 | Disposition: A | Discharge status: Home / Self Care | Payer: Medicare Other | Source: Ambulatory Visit | Attending: Radiation Oncology | Admitting: Radiation Oncology

## 2023-03-14 ENCOUNTER — Encounter: Payer: Self-pay | Admitting: Radiation Oncology

## 2023-03-14 VITALS — BP 126/72 | HR 105 | Resp 16 | Ht 61.0 in | Wt 123.9 lb

## 2023-03-14 DIAGNOSIS — Z171 Estrogen receptor negative status [ER-]: Secondary | ICD-10-CM | POA: Insufficient documentation

## 2023-03-14 DIAGNOSIS — C50912 Malignant neoplasm of unspecified site of left female breast: Secondary | ICD-10-CM | POA: Insufficient documentation

## 2023-03-14 NOTE — Progress Notes (Signed)
 Radiation Oncology Follow up Note  Name: Ruth Morgan   Date:   03/14/2023 MRN:  [id number] DOB: December 17, 1955    This 68 y.o. female presents to the clinic today for 1 month follow-up status post whole breast radiation to her left breast for stage 0 ductal carcinoma in situ.  REFERRING PROVIDER: Lorrine Morgan,*  HPI: Patient is a 68 year old female now out 1 month having pleated whole breast radiation to her left breast for stage 0 ductal carcinoma in situ.  Tumor was ER negative.  She is seen today in routine follow-up doing well specifically denies any breast tenderness cough or bone pain.  She is having some problems with her primary care doctor who is gone on leave..  She is not on endocrine therapy based on the ER negative nature of her disease.  COMPLICATIONS OF TREATMENT: none  FOLLOW UP COMPLIANCE: keeps appointments   PHYSICAL EXAM:  BP 126/72   Pulse (!) 105   Resp 16   Ht 5\' 1"  (1.549 m)   Wt 123 lb 14.4 oz (56.2 kg)   BMI 23.41 kg/m  Lungs are clear to A&P cardiac examination essentially unremarkable with regular rate and rhythm. No dominant mass or nodularity is noted in either breast in 2 positions examined. Incision is well-healed. No axillary or supraclavicular adenopathy is appreciated. Cosmetic result is excellent.  Well-developed well-nourished patient in NAD. HEENT reveals PERLA, EOMI, discs not visualized.  Oral cavity is clear. No oral mucosal lesions are identified. Neck is clear without evidence of cervical or supraclavicular adenopathy. Lungs are clear to A&P. Cardiac examination is essentially unremarkable with regular rate and rhythm without murmur rub or thrill. Abdomen is benign with no organomegaly or masses noted. Motor sensory and DTR levels are equal and symmetric in the upper and lower extremities. Cranial nerves II through XII are grossly intact. Proprioception is intact. No peripheral adenopathy or edema is identified. No motor or sensory levels  are noted. Crude visual fields are within normal range.  RADIOLOGY RESULTS: No current films for review  PLAN: At the present time patient is doing well 1 month out from whole breast radiation and pleased with her overall progress.  Of asked to see her back in 6 months for follow-up.  She is scheduled for mammograms in August through Dr. Cathie Hoops.  I have asked to see her back in 6 months for follow-up.  Patient knows to call sooner with any concerns.  I would like to take this opportunity to thank you for allowing me to participate in the care of your patient.Ruth Miller, MD

## 2023-04-18 ENCOUNTER — Encounter

## 2023-04-22 ENCOUNTER — Other Ambulatory Visit: Payer: Self-pay

## 2023-04-22 ENCOUNTER — Encounter: Attending: Internal Medicine

## 2023-04-22 DIAGNOSIS — J4489 Other specified chronic obstructive pulmonary disease: Secondary | ICD-10-CM

## 2023-04-22 NOTE — Progress Notes (Signed)
 Virtual Visit completed. Patient informed on EP and RD appointment and 6 Minute walk test. Patient also informed of patient health questionnaires on My Chart. Patient Verbalizes understanding. Visit diagnosis can be found in [facility] 03/15/2023.

## 2023-04-23 ENCOUNTER — Ambulatory Visit

## 2023-04-26 ENCOUNTER — Other Ambulatory Visit: Payer: Self-pay | Admitting: *Deleted

## 2023-04-26 DIAGNOSIS — Z87891 Personal history of nicotine dependence: Secondary | ICD-10-CM

## 2023-04-26 DIAGNOSIS — Z122 Encounter for screening for malignant neoplasm of respiratory organs: Secondary | ICD-10-CM

## 2023-06-04 ENCOUNTER — Telehealth: Payer: Self-pay | Admitting: *Deleted

## 2023-06-04 NOTE — Telephone Encounter (Signed)
 Patient says that she been out of radiation for 5 weeks and now she has a knot and swelling.  She thinks that she needs to be seen.  I called the patient back and there was no answer and could not leave a voice mail.

## 2023-06-05 NOTE — Telephone Encounter (Signed)
 Please schedule pt for MD in the next 1-2 weeks, for further eval. Please notify pt of appt details.

## 2023-06-17 ENCOUNTER — Ambulatory Visit: Payer: Self-pay | Admitting: Licensed Clinical Social Worker

## 2023-06-17 ENCOUNTER — Encounter: Payer: Self-pay | Admitting: Licensed Clinical Social Worker

## 2023-06-20 ENCOUNTER — Inpatient Hospital Stay: Admitting: Oncology

## 2023-07-22 ENCOUNTER — Ambulatory Visit: Admission: EM | Admit: 2023-07-22 | Discharge: 2023-07-22 | Attending: Family Medicine | Admitting: Family Medicine

## 2023-07-22 ENCOUNTER — Encounter: Payer: Self-pay | Admitting: Emergency Medicine

## 2023-07-22 DIAGNOSIS — R42 Dizziness and giddiness: Secondary | ICD-10-CM

## 2023-07-22 DIAGNOSIS — S0990XA Unspecified injury of head, initial encounter: Secondary | ICD-10-CM

## 2023-07-22 DIAGNOSIS — S0101XA Laceration without foreign body of scalp, initial encounter: Secondary | ICD-10-CM

## 2023-07-22 NOTE — Discharge Instructions (Signed)

## 2023-07-22 NOTE — ED Notes (Signed)
 Patient is being discharged from the Urgent Care and sent to the Emergency Department via personal vehicle . Per Dr. Kriste, patient is in need of higher level of care due to head laceration. Patient is aware and verbalizes understanding of plan of care.  Vitals:   07/22/23 1907  BP: (!) 158/88  Pulse: 92  Resp: 19  Temp: 98.3 F (36.8 C)  SpO2: 98%

## 2023-07-22 NOTE — ED Triage Notes (Signed)
 Pt states her husband was cutting down trees in the yard and a branch fell on her head. She denies LOC, but she reports dizziness.

## 2023-07-22 NOTE — ED Provider Notes (Incomplete)
 [facility]    CSN: [id number] Arrival date & time: 07/22/23  1902      History   Chief Complaint No chief complaint on file.   HPI Stacee Earp is a 68 y.o. female.   HPI  Asked by nursing staff to see pt due to head injury with scalp laceration  Bonnee presents for head injury that occurred just prior to arrival. Her husband was cutting a tree branch and she was standing underneath them. The branch fell onto the top of her head. She takes BC powders regualar but no aspirin or blood thinners.  Denies loss of consciousness but had some dizziness. No nausea, vomiting, blurry vision. She does have a headache.   Pain rated 8/10.  Denies any other injuries.        Past Medical History:  Diagnosis Date   ADHD (attention deficit hyperactivity disorder)    a.) on amphetamine-dextroamphetamine   Angina at rest [facility])    Anxiety state, unspecified 12/05/2012   Asthma    Bilateral inguinal hernia 04/2022   CAD (coronary artery disease) 03/23/2021   a.) MPI 03/01/2021: no ischemia; b.) cCTA 03/23/2021: Ca2+ 1.22 (48th %ile) in RCA; c.) cCTA 07/26/2022: Ca2+ 25.5 (58th %ile) in RCA distribution   Carpal tunnel syndrome    a.) s/p release on the RIGHT   COPD (chronic obstructive pulmonary disease) (HCC)    DDD (degenerative disc disease), lumbar    Diastolic dysfunction 03/01/2021   a.) TTE 03/01/2021: EF 50%, triv MR/PR, mild TR, mod AR, G1DD   Diverticulosis of colon    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   GERD (gastroesophageal reflux disease)    Headache    History of 2019 novel coronavirus disease (COVID-19) 2021   History of bilateral cataract extraction 2016   History of shingles 2013   Hyperlipidemia    Hypertension    Orthostatic hypotension    Prediabetes 12/05/2012   Shingles 06/25/2017   Shortness of breath on exertion    Tubular adenoma of colon 2010    Patient Active Problem List   Diagnosis Date Noted   Genetic  testing 10/22/2022   DCIS (ductal carcinoma in situ) 10/10/2022   Family history of cancer 10/10/2022   Lesion of lip 05/04/2013   Right knee pain 05/04/2013   Healthcare maintenance 01/02/2013   Prediabetes 12/05/2012   Smoker 12/05/2012   Anxiety state 12/05/2012   Headache 12/05/2012    Past Surgical History:  Procedure Laterality Date   ABDOMINAL HYSTERECTOMY     APPENDECTOMY  1972   BREAST BIOPSY Left 2016   needle bx /clip neg   BREAST BIOPSY Left 10/05/2022   stereo bx, asymmetry, X clip - pathology (+) for DCIS   BREAST BIOPSY Left 10/05/2022   MM LT BREAST BX W LOC DEV 1ST LESION IMAGE BX SPEC STEREO GUIDE 10/05/2022 [facility]   BREAST LUMPECTOMY Left 12/06/2022   Procedure: BREAST LUMPECTOMY;  Surgeon: Tye Millet, DO;  Location: [facility] ORS;  Service: General;  Laterality: Left;  re-excision of margins   BROW LIFT Bilateral 04/16/2017   Procedure: BLEPHAROPLASTY UPPER EYELID: W/EXCESS SKIN;  Surgeon: Ashley Greig HERO, MD;  Location: [facility];  Service: Ophthalmology;  Laterality: Bilateral;   CARPAL TUNNEL RELEASE Right 2014   CATARACT EXTRACTION W/ INTRAOCULAR LENS IMPLANT Right 05/04/2014   CATARACT EXTRACTION W/ INTRAOCULAR LENS IMPLANT Left 06/09/2014   COLONOSCOPY N/A 06/02/2021   Procedure: COLONOSCOPY;  Surgeon: Onita Elspeth Sharper, DO;  Location: [facility]  ENDOSCOPY;  Service: Gastroenterology;  Laterality: N/A;   COLONOSCOPY     2016, 2022   FINGER AMPUTATION Left    ring finger   INSERTION OF MESH Bilateral 04/27/2022   Procedure: INSERTION OF MESH;  Surgeon: Tye Millet, DO;  Location: [facility] ORS;  Service: General;  Laterality: Bilateral;   LASIK  2006   NASAL SINUS SURGERY  2004   PART MASTECTOMY,RADIO FREQUENCY LOCALIZER,AXILLARY SENTINEL NODE BIOPSY Left 11/01/2022   Procedure: PART MASTECTOMY,RADIO FREQUENCY LOCALIZER,AXILLARY SENTINEL NODE BIOPSY;  Surgeon: Tye Millet, DO;  Location: [facility] ORS;  Service: General;  Laterality: Left;   TOTAL  ABDOMINAL HYSTERECTOMY W/ BILATERAL SALPINGOOPHORECTOMY  1985   benign ovarian tumors    OB History   No obstetric history on file.      Home Medications    Prior to Admission medications   Medication Sig Start Date End Date Taking? Authorizing Provider  acetaminophen  (TYLENOL ) 325 MG tablet Take 650 mg by mouth every 6 (six) hours as needed for mild pain (pain score 1-3).    [provider]  albuterol  (VENTOLIN  HFA) 108 (90 Base) MCG/ACT inhaler Inhale 2 puffs into the lungs every 6 (six) hours as needed for wheezing or shortness of breath. 12/16/20   Bernardino Ditch, NP  amphetamine-dextroamphetamine (ADDERALL) 20 MG tablet Take 30 mg by mouth 2 (two) times daily.    [provider]  Budeson-Glycopyrrol-Formoterol (BREZTRI AEROSPHERE) 160-9-4.8 MCG/ACT AERO Inhale 2 puffs into the lungs in the morning and at bedtime. Patient not taking: Reported on 04/22/2023    [provider]  Cholecalciferol (VITAMIN D-3) 25 MCG (1000 UT) CAPS Take 100 Units by mouth daily.    [provider]  diltiazem (CARDIZEM CD) 120 MG 24 hr capsule Take 120 mg by mouth at bedtime. Patient not taking: Reported on 04/22/2023    [provider]  diltiazem (CARDIZEM CD) 120 MG 24 hr capsule Take by mouth. 01/15/23 01/15/24  [provider]  HYDROcodone -acetaminophen  (NORCO/VICODIN) 5-325 MG tablet Take 1 tablet by mouth every 6 (six) hours as needed. 12/06/22   [provider]  ibuprofen  (ADVIL ) 400 MG tablet Take 400 mg by mouth every 8 (eight) hours as needed. 02/11/23   [provider]  irbesartan (AVAPRO) 75 MG tablet Take by mouth. Patient not taking: Reported on 04/22/2023 01/15/23 01/15/24  [provider]  irbesartan (AVAPRO) 75 MG tablet Take 1 tablet by mouth daily. 01/15/23 01/15/24  [provider]  metoprolol  succinate (TOPROL -XL) 25 MG 24 hr tablet Take 1 tablet by mouth daily. 03/27/23 03/26/24  [provider]   MULTIPLE VITAMIN PO Take 1 tablet by mouth daily.    [provider]  rosuvastatin (CRESTOR) 20 MG tablet Take 20 mg by mouth at bedtime. 09/06/20   [provider]  spironolactone (ALDACTONE) 25 MG tablet Take 1 tablet by mouth daily. 03/27/23 03/26/24  [provider]  SYMBICORT 160-4.5 MCG/ACT inhaler SMARTSIG:2 inhalation Via Inhaler Twice Daily Patient not taking: Reported on 04/22/2023 01/01/23   [provider]  DOMINIC BECK 200-62.5-25 MCG/ACT AEPB Inhale 1 puff into the lungs daily. Patient not taking: Reported on 04/22/2023    [provider]  triamcinolone cream (KENALOG) 0.1 % TRIAMCINOLONE ACETONIDE 0.1 % CREA 08/09/11   [provider]    Family History Family History  Problem Relation Age of Onset   Hypertension Mother    Stomach cancer Father        d. early 37s   CAD Father 36  MI   Kidney failure Father        ESRD on HD   Hyperlipidemia Father    Diabetes Sister    CAD Sister        MI   Diabetes Brother    CAD Brother        MI   Lung cancer Paternal Uncle    Lung cancer Paternal Uncle    Ovarian cancer Paternal Grandmother        d. 18s   Lung cancer Paternal Grandfather        d.>50   Breast cancer Niece 28    Social History Social History   Tobacco Use   Smoking status: Former    Current packs/day: 0.00    Average packs/day: 0.8 packs/day for 46.0 years (34.5 ttl pk-yrs)    Types: Cigarettes    Start date: 12/1975    Quit date: 12/2021    Years since quitting: 1.6   Smokeless tobacco: Never   Tobacco comments:    .5 ppd currently  Vaping Use   Vaping status: Never Used  Substance Use Topics   Alcohol use: Yes    Comment: rare   Drug use: No     Allergies   Lisinopril   Review of Systems Review of Systems: negative unless otherwise stated in HPI.      Physical Exam Triage Vital Signs ED Triage Vitals  Encounter Vitals Group     BP      Girls Systolic BP Percentile       Girls Diastolic BP Percentile      Boys Systolic BP Percentile      Boys Diastolic BP Percentile      Pulse      Resp      Temp      Temp src      SpO2      Weight      Height      Head Circumference      Peak Flow      Pain Score      Pain Loc      Pain Education      Exclude from Growth Chart    No data found.  Updated Vital Signs There were no vitals taken for this visit.  Visual Acuity Right Eye Distance:   Left Eye Distance:   Bilateral Distance:    Right Eye Near:   Left Eye Near:    Bilateral Near:     Physical Exam GEN: Alert, female in no acute distress  EYES: Extraocular movements intact, pupils equal round and reactive to light HENT: Moist mucous membranes, no oropharyngeal lesions, no blood visble, no hemotympanum, right parietal hematoma with 2.5 cm curved scalp laceration  NECK: Normal range of motion,  CV: regular rate and rhythm, no chest wall trauma RESP: no increased work of breathing, clear to ascultation bilaterally MSK: No extremity edema or deformities, atraumatic  SKIN: warm, dry, no abrasions NEURO: alert, moves all extremities appropriately, strength 5/5 bilateral upper and lower extremities, alert and oriented, normal speech      UC Treatments / Results  Labs (all labs ordered are listed, but only abnormal results are displayed) Labs Reviewed - No data to display  EKG   Radiology No results found.  Procedures Procedures (including critical care time)  Medications Ordered in UC Medications - No data to display  Initial Impression / Assessment and Plan / UC Course  I have reviewed the triage vital signs and  the nursing notes.  Pertinent labs & imaging results that were available during my care of the patient were reviewed by me and considered in my medical decision making (see chart for details).       Patient is a 68 y.o. female who presents for head injury that occurred *** days ago.  ***describe circumstance   she *** had been ***intermittently disoriented with a headache but this has resolved.  her neuro exam is completely normal.  I suspect patient has a concussion.  [place] CT rules indicate patient does ***not need a head CT at this time.  she has no head wounds or bruising.  Discussed concussion treatment and return to play with patient and mom.  School sports and gym note provided.  Questions asked were answered.  Patient to follow-up with sports medicine for return to play.  ED and return precautions given and patient/guardian voiced understanding. Discussed MDM, treatment plan and plan for follow-up with patient/parent who agrees with plan.       Final Clinical Impressions(s) / UC Diagnoses   Final diagnoses:  None   Discharge Instructions   None    ED Prescriptions   None    PDMP not reviewed this encounter.

## 2023-09-11 ENCOUNTER — Other Ambulatory Visit: Payer: Self-pay | Admitting: *Deleted

## 2023-09-11 ENCOUNTER — Ambulatory Visit: Payer: Self-pay | Admitting: Oncology

## 2023-09-11 ENCOUNTER — Inpatient Hospital Stay
Admission: RE | Admit: 2023-09-11 | Discharge: 2023-09-11 | Disposition: A | Discharge status: Home / Self Care | Payer: Self-pay | Source: Ambulatory Visit | Attending: Family Medicine | Admitting: Family Medicine

## 2023-09-11 ENCOUNTER — Ambulatory Visit
Admission: RE | Admit: 2023-09-11 | Discharge: 2023-09-11 | Disposition: A | Discharge status: Home / Self Care | Source: Ambulatory Visit | Attending: Oncology | Admitting: Oncology

## 2023-09-11 DIAGNOSIS — D0512 Intraductal carcinoma in situ of left breast: Secondary | ICD-10-CM

## 2023-09-11 DIAGNOSIS — Z1231 Encounter for screening mammogram for malignant neoplasm of breast: Secondary | ICD-10-CM

## 2023-09-11 HISTORY — DX: Personal history of irradiation: Z92.3

## 2023-09-12 ENCOUNTER — Other Ambulatory Visit: Payer: Self-pay | Admitting: Oncology

## 2023-09-12 DIAGNOSIS — R928 Other abnormal and inconclusive findings on diagnostic imaging of breast: Secondary | ICD-10-CM

## 2023-09-12 NOTE — Progress Notes (Signed)
 Per report, Ruth Morgan will contact pt to set up left breast US  aspiration

## 2023-09-13 NOTE — Progress Notes (Signed)
 Breast aspiration scheduled for 9/15

## 2023-09-16 ENCOUNTER — Ambulatory Visit
Admission: RE | Admit: 2023-09-16 | Discharge: 2023-09-16 | Disposition: A | Discharge status: Home / Self Care | Source: Ambulatory Visit | Attending: Oncology | Admitting: Oncology

## 2023-09-16 ENCOUNTER — Other Ambulatory Visit: Payer: Self-pay | Admitting: Oncology

## 2023-09-16 ENCOUNTER — Ambulatory Visit
Admission: RE | Admit: 2023-09-16 | Discharge: 2023-09-16 | Disposition: A | Discharge status: Home / Self Care | Source: Ambulatory Visit | Attending: Oncology

## 2023-09-16 DIAGNOSIS — N6012 Diffuse cystic mastopathy of left breast: Secondary | ICD-10-CM | POA: Insufficient documentation

## 2023-09-16 DIAGNOSIS — R928 Other abnormal and inconclusive findings on diagnostic imaging of breast: Secondary | ICD-10-CM

## 2023-09-16 HISTORY — PX: BREAST BIOPSY: SHX20

## 2023-09-16 MED ORDER — LIDOCAINE-EPINEPHRINE 1 %-1:100000 IJ SOLN
5.0000 mL | Freq: Once | INTRAMUSCULAR | Status: AC
  Administered 2023-09-16: 5 mL

## 2023-09-16 MED ORDER — LIDOCAINE 1 % OPTIME INJ - NO CHARGE
2.0000 mL | Freq: Once | INTRAMUSCULAR | Status: AC
  Administered 2023-09-16: 2 mL
  Filled 2023-09-16: qty 2

## 2023-09-17 LAB — SURGICAL PATHOLOGY

## 2023-09-18 ENCOUNTER — Ambulatory Visit
Admission: RE | Admit: 2023-09-18 | Discharge: 2023-09-18 | Disposition: A | Discharge status: Home / Self Care | Source: Ambulatory Visit | Attending: Radiation Oncology | Admitting: Radiation Oncology

## 2023-09-18 VITALS — BP 119/76 | HR 76 | Temp 97.3°F | Resp 16 | Wt 120.0 lb

## 2023-09-18 DIAGNOSIS — Z923 Personal history of irradiation: Secondary | ICD-10-CM | POA: Diagnosis not present

## 2023-09-18 DIAGNOSIS — Z170 Estrogen receptor positive status [ER+]: Secondary | ICD-10-CM | POA: Diagnosis not present

## 2023-09-18 DIAGNOSIS — D0512 Intraductal carcinoma in situ of left breast: Secondary | ICD-10-CM | POA: Insufficient documentation

## 2023-09-18 DIAGNOSIS — C50912 Malignant neoplasm of unspecified site of left female breast: Secondary | ICD-10-CM

## 2023-09-18 NOTE — Progress Notes (Signed)
 Radiation Oncology Follow up Note  Name: Ruth Morgan   Date:   09/18/2023 MRN:  [id number] DOB: 1955-12-08    This 68 y.o. female presents to the clinic today for 69-month follow-up status post whole breast radiation to her left breast for ductal carcinoma in situ ER negative.  REFERRING PROVIDER: Claudene Arthea Sharper,*  HPI: Patient is a 68 year old female now out 7 months having pleated whole breast radiation to her left breast for stage 0 ductal carcinoma site to ER negative.  Seen today in routine follow-up she is doing well.  She specifically denies breast tenderness cough or bone pain..  She recently had mammograms showing an abnormality at the 10 o'clock position of the left breast for which biopsy was performed showing only benign breast tissue with fibrocystic changes including stromal fibrosis and focal fat necrosis.  She is not on endocrine therapy based on the ER-negative nature of her disease.  COMPLICATIONS OF TREATMENT: none  FOLLOW UP COMPLIANCE: keeps appointments   PHYSICAL EXAM:  There were no vitals taken for this visit. Lungs are clear to A&P cardiac examination essentially unremarkable with regular rate and rhythm. No dominant mass or nodularity is noted in either breast in 2 positions examined. Incision is well-healed. No axillary or supraclavicular adenopathy is appreciated. Cosmetic result is excellent.  Well-developed well-nourished patient in NAD. HEENT reveals PERLA, EOMI, discs not visualized.  Oral cavity is clear. No oral mucosal lesions are identified. Neck is clear without evidence of cervical or supraclavicular adenopathy. Lungs are clear to A&P. Cardiac examination is essentially unremarkable with regular rate and rhythm without murmur rub or thrill. Abdomen is benign with no organomegaly or masses noted. Motor sensory and DTR levels are equal and symmetric in the upper and lower extremities. Cranial nerves II through XII are grossly intact. Proprioception  is intact. No peripheral adenopathy or edema is identified. No motor or sensory levels are noted. Crude visual fields are within normal range.  RADIOLOGY RESULTS: Mammograms and ultrasounds reviewed compatible with above-stated findings  PLAN: Present time patient is doing well.  Pleased that the biopsy was negative.  I have asked to see her back in 6 months for follow-up.  Patient knows to call sooner with any concerns.  I would like to take this opportunity to thank you for allowing me to participate in the care of your patient.SABRA Marcey Penton, MD

## 2023-09-27 ENCOUNTER — Encounter: Payer: Self-pay | Admitting: Oncology

## 2023-09-27 NOTE — Telephone Encounter (Signed)
 Letter available under letters tab , please advise.

## 2023-10-04 ENCOUNTER — Telehealth: Payer: Self-pay | Admitting: Oncology

## 2023-10-04 NOTE — Telephone Encounter (Signed)
 Per RN schedule pt to see MD in 2-3 weeks.  Appt scheduled and left vm with appt day/time. Mychart msg also sent.

## 2023-10-04 NOTE — Telephone Encounter (Signed)
 Appt reminder also sent

## 2023-10-04 NOTE — Telephone Encounter (Signed)
 Please contact pt to schedule MD only in 2-3 weeks

## 2023-10-18 ENCOUNTER — Inpatient Hospital Stay: Admitting: Oncology

## 2023-11-01 ENCOUNTER — Other Ambulatory Visit: Payer: Self-pay | Admitting: Family Medicine

## 2023-11-01 DIAGNOSIS — M810 Age-related osteoporosis without current pathological fracture: Secondary | ICD-10-CM

## 2023-11-25 ENCOUNTER — Encounter: Payer: Self-pay | Admitting: Acute Care

## 2024-02-26 ENCOUNTER — Ambulatory Visit: Payer: Medicare Other | Admitting: Oncology
